# Patient Record
Sex: Female | Born: 1975 | Race: White | Hispanic: No | Marital: Single | Smoking: Current some day smoker
Health system: Southern US, Community
[De-identification: ages and names within clinical notes are randomized; demographics above are authoritative.]

## PROBLEM LIST (undated history)

## (undated) ENCOUNTER — Inpatient Hospital Stay (HOSPITAL_COMMUNITY): Payer: Self-pay

## (undated) ENCOUNTER — Emergency Department (HOSPITAL_COMMUNITY): Discharge status: Against Medical Advice | Payer: Medicaid Other

## (undated) DIAGNOSIS — D219 Benign neoplasm of connective and other soft tissue, unspecified: Secondary | ICD-10-CM

## (undated) DIAGNOSIS — Z302 Encounter for sterilization: Secondary | ICD-10-CM

## (undated) DIAGNOSIS — D259 Leiomyoma of uterus, unspecified: Secondary | ICD-10-CM

## (undated) DIAGNOSIS — O3410 Maternal care for benign tumor of corpus uteri, unspecified trimester: Secondary | ICD-10-CM

## (undated) DIAGNOSIS — O1400 Mild to moderate pre-eclampsia, unspecified trimester: Secondary | ICD-10-CM

## (undated) DIAGNOSIS — F329 Major depressive disorder, single episode, unspecified: Secondary | ICD-10-CM

## (undated) DIAGNOSIS — D251 Intramural leiomyoma of uterus: Secondary | ICD-10-CM

## (undated) DIAGNOSIS — N809 Endometriosis, unspecified: Secondary | ICD-10-CM

## (undated) DIAGNOSIS — Z87440 Personal history of urinary (tract) infections: Secondary | ICD-10-CM

## (undated) DIAGNOSIS — R102 Pelvic and perineal pain: Secondary | ICD-10-CM

## (undated) DIAGNOSIS — M797 Fibromyalgia: Secondary | ICD-10-CM

## (undated) DIAGNOSIS — F32A Depression, unspecified: Secondary | ICD-10-CM

## (undated) DIAGNOSIS — N939 Abnormal uterine and vaginal bleeding, unspecified: Secondary | ICD-10-CM

## (undated) DIAGNOSIS — N801 Endometriosis of ovary: Secondary | ICD-10-CM

## (undated) DIAGNOSIS — Z8751 Personal history of pre-term labor: Secondary | ICD-10-CM

## (undated) DIAGNOSIS — Z9851 Tubal ligation status: Secondary | ICD-10-CM

## (undated) DIAGNOSIS — F419 Anxiety disorder, unspecified: Secondary | ICD-10-CM

## (undated) DIAGNOSIS — E669 Obesity, unspecified: Secondary | ICD-10-CM

## (undated) DIAGNOSIS — Z98891 History of uterine scar from previous surgery: Secondary | ICD-10-CM

## (undated) DIAGNOSIS — D649 Anemia, unspecified: Secondary | ICD-10-CM

## (undated) HISTORY — DX: Obesity, unspecified: E66.9

## (undated) HISTORY — DX: Personal history of pre-term labor: Z87.51

## (undated) HISTORY — PX: CHOLECYSTECTOMY: SHX55

## (undated) HISTORY — PX: KNEE SURGERY: SHX244

## (undated) HISTORY — DX: Personal history of urinary (tract) infections: Z87.440

## (undated) HISTORY — DX: Maternal care for benign tumor of corpus uteri, unspecified trimester: O34.10

## (undated) HISTORY — DX: Leiomyoma of uterus, unspecified: D25.9

---

## 1998-08-05 ENCOUNTER — Encounter: Admission: RE | Admit: 1998-08-05 | Discharge: 1998-08-05 | Payer: Self-pay | Admitting: *Deleted

## 1999-09-13 ENCOUNTER — Other Ambulatory Visit: Admission: RE | Admit: 1999-09-13 | Discharge: 1999-09-13 | Payer: Self-pay | Admitting: *Deleted

## 2000-10-02 ENCOUNTER — Other Ambulatory Visit: Admission: RE | Admit: 2000-10-02 | Discharge: 2000-10-02 | Payer: Self-pay | Admitting: *Deleted

## 2001-10-24 ENCOUNTER — Other Ambulatory Visit: Admission: RE | Admit: 2001-10-24 | Discharge: 2001-10-24 | Payer: Self-pay | Admitting: Obstetrics and Gynecology

## 2002-03-10 ENCOUNTER — Emergency Department (HOSPITAL_COMMUNITY): Admission: EM | Admit: 2002-03-10 | Discharge: 2002-03-10 | Payer: Self-pay | Admitting: Emergency Medicine

## 2002-11-27 ENCOUNTER — Other Ambulatory Visit: Admission: RE | Admit: 2002-11-27 | Discharge: 2002-11-27 | Payer: Self-pay | Admitting: Obstetrics and Gynecology

## 2004-08-09 ENCOUNTER — Emergency Department (HOSPITAL_COMMUNITY): Admission: EM | Admit: 2004-08-09 | Discharge: 2004-08-09 | Payer: Self-pay | Admitting: Emergency Medicine

## 2004-08-09 ENCOUNTER — Emergency Department: Payer: Self-pay | Admitting: Emergency Medicine

## 2006-04-29 ENCOUNTER — Inpatient Hospital Stay (HOSPITAL_COMMUNITY): Admission: AD | Admit: 2006-04-29 | Discharge: 2006-04-29 | Payer: Self-pay | Admitting: Obstetrics and Gynecology

## 2006-05-04 ENCOUNTER — Inpatient Hospital Stay (HOSPITAL_COMMUNITY): Admission: AD | Admit: 2006-05-04 | Discharge: 2006-05-04 | Payer: Self-pay | Admitting: Obstetrics and Gynecology

## 2006-12-05 ENCOUNTER — Emergency Department (HOSPITAL_COMMUNITY): Admission: EM | Admit: 2006-12-05 | Discharge: 2006-12-05 | Payer: Self-pay | Admitting: Emergency Medicine

## 2008-07-18 ENCOUNTER — Emergency Department (HOSPITAL_COMMUNITY): Admission: EM | Admit: 2008-07-18 | Discharge: 2008-07-18 | Payer: Self-pay | Admitting: Emergency Medicine

## 2011-04-04 LAB — RPR: RPR: NONREACTIVE

## 2011-04-04 LAB — GC/CHLAMYDIA PROBE AMP, GENITAL: Chlamydia: NEGATIVE

## 2011-04-04 LAB — RUBELLA ANTIBODY, IGM: Rubella: IMMUNE

## 2011-04-04 LAB — HIV ANTIBODY (ROUTINE TESTING W REFLEX): HIV: NONREACTIVE

## 2011-07-14 ENCOUNTER — Encounter (HOSPITAL_COMMUNITY): Payer: Self-pay | Admitting: *Deleted

## 2011-07-14 ENCOUNTER — Inpatient Hospital Stay (HOSPITAL_COMMUNITY)
Admission: AD | Admit: 2011-07-14 | Discharge: 2011-07-14 | Disposition: A | Discharge status: Home / Self Care | Payer: Medicaid Other | Source: Ambulatory Visit | Attending: Obstetrics and Gynecology | Admitting: Obstetrics and Gynecology

## 2011-07-14 DIAGNOSIS — O99891 Other specified diseases and conditions complicating pregnancy: Secondary | ICD-10-CM | POA: Insufficient documentation

## 2011-07-14 DIAGNOSIS — O4700 False labor before 37 completed weeks of gestation, unspecified trimester: Secondary | ICD-10-CM

## 2011-07-14 DIAGNOSIS — R030 Elevated blood-pressure reading, without diagnosis of hypertension: Secondary | ICD-10-CM | POA: Insufficient documentation

## 2011-07-14 DIAGNOSIS — O479 False labor, unspecified: Secondary | ICD-10-CM

## 2011-07-14 HISTORY — DX: Fibromyalgia: M79.7

## 2011-07-14 HISTORY — DX: Depression, unspecified: F32.A

## 2011-07-14 HISTORY — DX: Anxiety disorder, unspecified: F41.9

## 2011-07-14 HISTORY — DX: Major depressive disorder, single episode, unspecified: F32.9

## 2011-07-14 LAB — COMPREHENSIVE METABOLIC PANEL
AST: 11 U/L (ref 0–37)
CO2: 24 mEq/L (ref 19–32)
Chloride: 101 mEq/L (ref 96–112)
Creatinine, Ser: 0.51 mg/dL (ref 0.50–1.10)
GFR calc Af Amer: >90 mL/min (ref >90)
GFR calc non Af Amer: >90 mL/min (ref >90)
Glucose, Bld: 106 mg/dL — ABNORMAL HIGH (ref 70–99)
Potassium: 3.5 mEq/L (ref 3.5–5.1)
Sodium: 135 mEq/L (ref 135–145)
Total Protein: 6.6 g/dL (ref 6.0–8.3)

## 2011-07-14 LAB — CBC
HCT: 32.7 % — ABNORMAL LOW (ref 36.0–46.0)
Hemoglobin: 10.9 g/dL — ABNORMAL LOW (ref 12.0–15.0)
MCHC: 33.3 g/dL (ref 30.0–36.0)
MCV: 91.3 fL (ref 78.0–100.0)
RDW: 13.1 % (ref 11.5–15.5)

## 2011-07-14 MED ORDER — TERBUTALINE SULFATE 1 MG/ML IJ SOLN
0.2500 mg | Freq: Once | INTRAMUSCULAR | Status: AC
  Administered 2011-07-14: 0.25 mg via SUBCUTANEOUS
  Filled 2011-07-14: qty 1

## 2011-07-14 NOTE — Progress Notes (Signed)
Pt sent over from office for pih eval and preterm labor eval.  Reports ucs since 1030, progressively getting stronger and closer together.  Reports mild headache throughout day, denies any dizziness, blurred vision or epigastric pain.  Denies any bleeding or lof.  + FM

## 2011-07-14 NOTE — ED Provider Notes (Signed)
History   Pt presents today after being seen in the office by Dr. Ambrose Mantle. At that time she was noted to have an elevated BP and ctx. She was sent to MAU for preeclamptic workup and fetal monitoring. Fetal fibronectin was performed in the office.  Chief Complaint  Patient presents with  . Contractions  . Hypertension   HPI  OB History    Grav Para Term Preterm Abortions TAB SAB Ect Mult Living   3 2 0 2 0 0 0 0 0 0       Past Medical History  Diagnosis Date  . Anxiety   . Fibromyalgia   . Depression     Past Surgical History  Procedure Date  . Knee surgery   . Cholecystectomy     History reviewed. No pertinent family history.  History  Substance Use Topics  . Smoking status: Never Smoker   . Smokeless tobacco: Not on file  . Alcohol Use: No    Allergies: No Known Allergies  Prescriptions prior to admission  Medication Sig Dispense Refill  . acetaminophen (TYLENOL) 500 MG chewable tablet Chew 500 mg by mouth every 6 (six) hours as needed. pain      . Prenatal Vit-Fe Fumarate-FA (PRENATAL MULTIVITAMIN) TABS Take 1 tablet by mouth daily.        Review of Systems  Constitutional: Negative for fever.  Eyes: Negative for blurred vision and double vision.  Cardiovascular: Negative for chest pain and palpitations.  Gastrointestinal: Positive for abdominal pain. Negative for nausea, vomiting, diarrhea, constipation and blood in stool.  Genitourinary: Negative for dysuria, urgency, frequency and hematuria.  Neurological: Positive for headaches. Negative for dizziness.  Psychiatric/Behavioral: Negative for depression and suicidal ideas.   Physical Exam   Blood pressure 147/93, pulse 88, temperature 98.1 F (36.7 C), temperature source Oral, resp. rate 18, height 5\' 9"  (1.753 m), weight 215 lb (97.523 kg).  Physical Exam  Nursing note and vitals reviewed. Constitutional: She is oriented to person, place, and time. She appears well-developed and well-nourished. No  distress.  HENT:  Head: Normocephalic and atraumatic.  Eyes: EOM are normal. Pupils are equal, round, and reactive to light.  GI: Soft. She exhibits no distension. There is no tenderness. There is no rebound and no guarding.  Neurological: She is alert and oriented to person, place, and time.  Skin: Skin is warm and dry. She is not diaphoretic.  Psychiatric: She has a normal mood and affect. Her behavior is normal. Judgment and thought content normal.    MAU Course  Procedures  Results for orders placed during the hospital encounter of 07/14/11 (from the past 72 hour(s))  CBC     Status: Abnormal   Collection Time   07/14/11  2:02 PM      Component Value Range Comment   WBC 14.9 (*) 4.0 - 10.5 (K/uL)    RBC 3.58 (*) 3.87 - 5.11 (MIL/uL)    Hemoglobin 10.9 (*) 12.0 - 15.0 (g/dL)    HCT 40.9 (*) 81.1 - 46.0 (%)    MCV 91.3  78.0 - 100.0 (fL)    MCH 30.4  26.0 - 34.0 (pg)    MCHC 33.3  30.0 - 36.0 (g/dL)    RDW 91.4  78.2 - 95.6 (%)    Platelets 300  150 - 400 (K/uL)   COMPREHENSIVE METABOLIC PANEL     Status: Abnormal   Collection Time   07/14/11  2:02 PM      Component Value Range Comment  Sodium 135  135 - 145 (mEq/L)    Potassium 3.5  3.5 - 5.1 (mEq/L)    Chloride 101  96 - 112 (mEq/L)    CO2 24  19 - 32 (mEq/L)    Glucose, Bld 106 (*) 70 - 99 (mg/dL)    BUN 7  6 - 23 (mg/dL)    Creatinine, Ser 1.61  0.50 - 1.10 (mg/dL)    Calcium 8.9  8.4 - 10.5 (mg/dL)    Total Protein 6.6  6.0 - 8.3 (g/dL)    Albumin 3.2 (*) 3.5 - 5.2 (g/dL)    AST 11  0 - 37 (U/L)    ALT 10  0 - 35 (U/L)    Alkaline Phosphatase 71  39 - 117 (U/L)    Total Bilirubin 0.2 (*) 0.3 - 1.2 (mg/dL)    GFR calc non Af Amer >90  >90 (mL/min)    GFR calc Af Amer >90  >90 (mL/min)   LACTATE DEHYDROGENASE     Status: Normal   Collection Time   07/14/11  2:02 PM      Component Value Range Comment   LD 124  94 - 250 (U/L)   URIC ACID     Status: Normal   Collection Time   07/14/11  2:02 PM      Component  Value Range Comment   Uric Acid, Serum 4.4  2.4 - 7.0 (mg/dL)    NST still shows ctx every 1-2 min.   Discussed pt with Dr. Ambrose Mantle. Advised to give terbutaline.  Pt no longer having ctx. Discussed with Dr. Ambrose Mantle. Advised to dc pt with precautions.   Assessment and Plan  TPTL: discussed with pt at length. Discussed signs and sx of preterm labor. She has f/u scheduled. Discussed diet, activity, risks, and precautions.  Also discussed signs and sx of preeclampsia.  Clinton Gallant. Connee Ikner III, DrHSc, MPAS, PA-C  07/14/2011, 2:27 PM   Henrietta Hoover, PA 07/14/11 1702

## 2011-08-27 ENCOUNTER — Encounter (HOSPITAL_COMMUNITY): Payer: Self-pay | Admitting: *Deleted

## 2011-08-27 ENCOUNTER — Inpatient Hospital Stay (HOSPITAL_COMMUNITY)
Admission: AD | Admit: 2011-08-27 | Discharge: 2011-08-27 | Disposition: A | Discharge status: Home Health | Payer: Medicaid Other | Source: Ambulatory Visit | Attending: Obstetrics and Gynecology | Admitting: Obstetrics and Gynecology

## 2011-08-27 DIAGNOSIS — O4700 False labor before 37 completed weeks of gestation, unspecified trimester: Secondary | ICD-10-CM | POA: Insufficient documentation

## 2011-08-27 DIAGNOSIS — O479 False labor, unspecified: Secondary | ICD-10-CM

## 2011-08-27 HISTORY — DX: Benign neoplasm of connective and other soft tissue, unspecified: D21.9

## 2011-08-27 LAB — URINALYSIS, ROUTINE W REFLEX MICROSCOPIC
Bilirubin Urine: NEGATIVE
Glucose, UA: NEGATIVE mg/dL
Ketones, ur: NEGATIVE mg/dL
Leukocytes, UA: NEGATIVE
Nitrite: NEGATIVE
Specific Gravity, Urine: 1.01 (ref 1.005–1.030)

## 2011-08-27 LAB — URINE MICROSCOPIC-ADD ON

## 2011-08-27 NOTE — ED Provider Notes (Signed)
Allyn WINIFRED BODIFORD is a 36 y.o. year old G3P0202 female at [redacted]w[redacted]d weeks gestation who presents to MAU reporting mild preterm UC's since last night, worse since 1430. She reports pos FM and denies vaginal bleeding or leaking of fluid.   Maternal Medical History:  Reason for admission: Reason for Admission:   nausea  OB History    Grav Para Term Preterm Abortions TAB SAB Ect Mult Living   3 2 0 2 0 0 0 0 0 2      Past Medical History  Diagnosis Date  . Anxiety   . Fibromyalgia   . Depression   . Fibroids    Past Surgical History  Procedure Date  . Knee surgery   . Cholecystectomy    Family History: family history is not on file. Social History:  reports that she has been smoking Cigarettes.  She has been smoking about 1 pack per day. She has never used smokeless tobacco. She reports that she does not drink alcohol or use illicit drugs.  Review of Systems  Constitutional: Negative for fever and chills.  Gastrointestinal: Positive for abdominal pain. Negative for nausea and vomiting.  Genitourinary: Negative for dysuria, urgency, frequency, hematuria and flank pain.      Blood pressure 122/90, pulse 100, temperature 97.7 F (36.5 C), temperature source Oral, height 5\' 9"  (1.753 m), weight 103.874 kg (229 lb). Maternal Exam:  Uterine Assessment: Contraction strength is mild.  Contraction duration is 20 seconds. Contraction frequency is regular.   Abdomen: Fundal height is S>D.    Introitus: Normal vulva. Normal vagina.  Pelvis: adequate for delivery.   Cervix: Cervix evaluated by sterile speculum exam and digital exam.     Fetal Exam Fetal Monitor Review: Mode: ultrasound.   Baseline rate: 130.  Variability: moderate (6-25 bpm).   Pattern: accelerations present and no decelerations.    Fetal State Assessment: Category I - tracings are normal.     Physical Exam  Nursing note and vitals reviewed. Constitutional: She is oriented to person, place, and time. She appears  well-developed and well-nourished. No distress.  Cardiovascular: Normal rate.   Respiratory: Effort normal.  GI: Soft. There is no tenderness.  Genitourinary: Vagina normal and uterus normal.  Neurological: She is alert and oriented to person, place, and time.  Skin: Skin is warm and dry.  Psychiatric: She has a normal mood and affect.   Dilation: Fingertip Effacement (%): Thick Cervical Position: Posterior Station: -2 Presentation: Undeterminable Exam by:: V.Braelin Brosch,CNM Prenatal labs: ABO, Rh:   Antibody:   Rubella:   RPR:    HBsAg:    HIV:    GBS:     Assessment/Plan: D/C home per consult w/ Bovard GBS sent PLT precautions, FKCs F/U in office as scheduled or MAU PRN  Dorathy Kinsman 08/27/2011, 5:44 PM

## 2011-08-27 NOTE — Progress Notes (Signed)
Ctx all night, settled down for few hours, gotten stronger at 1430

## 2011-08-30 LAB — CULTURE, BETA STREP (GROUP B ONLY): Special Requests: NORMAL

## 2011-09-01 LAB — STREP B DNA PROBE: GBS: NEGATIVE

## 2011-09-16 ENCOUNTER — Encounter (HOSPITAL_COMMUNITY): Payer: Self-pay

## 2011-09-19 ENCOUNTER — Encounter (HOSPITAL_COMMUNITY): Payer: Self-pay | Admitting: *Deleted

## 2011-09-19 ENCOUNTER — Telehealth (HOSPITAL_COMMUNITY): Payer: Self-pay | Admitting: *Deleted

## 2011-09-19 ENCOUNTER — Other Ambulatory Visit: Payer: Self-pay | Admitting: Obstetrics and Gynecology

## 2011-09-19 NOTE — Telephone Encounter (Signed)
Preadmission screen  

## 2011-09-20 ENCOUNTER — Encounter (HOSPITAL_COMMUNITY): Payer: Self-pay

## 2011-09-20 ENCOUNTER — Encounter (HOSPITAL_COMMUNITY): Payer: Self-pay | Admitting: Anesthesiology

## 2011-09-20 ENCOUNTER — Inpatient Hospital Stay (HOSPITAL_COMMUNITY)
Admission: RE | Admit: 2011-09-20 | Discharge: 2011-09-22 | DRG: 765 | Disposition: A | Discharge status: Home / Self Care | Payer: Medicaid Other | Source: Ambulatory Visit | Attending: Obstetrics and Gynecology | Admitting: Obstetrics and Gynecology

## 2011-09-20 ENCOUNTER — Inpatient Hospital Stay (HOSPITAL_COMMUNITY): Payer: Medicaid Other | Admitting: Anesthesiology

## 2011-09-20 ENCOUNTER — Encounter (HOSPITAL_COMMUNITY)
Admission: RE | Disposition: A | Discharge status: Home / Self Care | Payer: Self-pay | Source: Ambulatory Visit | Attending: Obstetrics and Gynecology

## 2011-09-20 VITALS — BP 125/83 | HR 83 | Temp 98.4°F | Resp 20 | Ht 69.0 in | Wt 220.0 lb

## 2011-09-20 DIAGNOSIS — O321XX0 Maternal care for breech presentation, not applicable or unspecified: Secondary | ICD-10-CM | POA: Diagnosis present

## 2011-09-20 DIAGNOSIS — IMO0002 Reserved for concepts with insufficient information to code with codable children: Principal | ICD-10-CM | POA: Diagnosis present

## 2011-09-20 DIAGNOSIS — O409XX0 Polyhydramnios, unspecified trimester, not applicable or unspecified: Secondary | ICD-10-CM

## 2011-09-20 DIAGNOSIS — Z98891 History of uterine scar from previous surgery: Secondary | ICD-10-CM

## 2011-09-20 DIAGNOSIS — O1400 Mild to moderate pre-eclampsia, unspecified trimester: Secondary | ICD-10-CM

## 2011-09-20 HISTORY — DX: History of uterine scar from previous surgery: Z98.891

## 2011-09-20 HISTORY — DX: Mild to moderate pre-eclampsia, unspecified trimester: O14.00

## 2011-09-20 LAB — ABO/RH: ABO/RH(D): O POS

## 2011-09-20 LAB — COMPREHENSIVE METABOLIC PANEL
AST: 13 U/L (ref 0–37)
Albumin: 3 g/dL — ABNORMAL LOW (ref 3.5–5.2)
Alkaline Phosphatase: 129 U/L — ABNORMAL HIGH (ref 39–117)
Chloride: 103 mEq/L (ref 96–112)
Creatinine, Ser: 0.5 mg/dL (ref 0.50–1.10)
Potassium: 3.8 mEq/L (ref 3.5–5.1)
Sodium: 137 mEq/L (ref 135–145)
Total Bilirubin: 0.2 mg/dL — ABNORMAL LOW (ref 0.3–1.2)

## 2011-09-20 LAB — CBC
Platelets: 404 10*3/uL — ABNORMAL HIGH (ref 150–400)
RDW: 13.8 % (ref 11.5–15.5)
WBC: 18 10*3/uL — ABNORMAL HIGH (ref 4.0–10.5)

## 2011-09-20 SURGERY — Surgical Case
Anesthesia: Spinal | Site: Abdomen | Wound class: Clean Contaminated

## 2011-09-20 MED ORDER — CEFAZOLIN SODIUM 1-5 GM-% IV SOLN
INTRAVENOUS | Status: DC | PRN
  Administered 2011-09-20: 2 g via INTRAVENOUS

## 2011-09-20 MED ORDER — ACETAMINOPHEN 325 MG PO TABS: 325.0000 mg | ORAL_TABLET | ORAL | Status: DC | PRN

## 2011-09-20 MED ORDER — TETANUS-DIPHTH-ACELL PERTUSSIS 5-2.5-18.5 LF-MCG/0.5 IM SUSP
0.5000 mL | Freq: Once | INTRAMUSCULAR | Status: AC
  Administered 2011-09-21: 0.5 mL via INTRAMUSCULAR
  Filled 2011-09-20: qty 0.5

## 2011-09-20 MED ORDER — DIPHENHYDRAMINE HCL 50 MG/ML IJ SOLN: 25.0000 mg | INTRAMUSCULAR | Status: DC | PRN

## 2011-09-20 MED ORDER — LACTATED RINGERS IV SOLN
INTRAVENOUS | Status: DC
  Administered 2011-09-21: 01:00:00 via INTRAVENOUS

## 2011-09-20 MED ORDER — GUAIFENESIN 100 MG/5ML PO SOLN: 10.0000 mL | ORAL | Status: DC | PRN

## 2011-09-20 MED ORDER — FENTANYL CITRATE 0.05 MG/ML IJ SOLN
INTRAMUSCULAR | Status: AC
  Filled 2011-09-20: qty 2

## 2011-09-20 MED ORDER — ONDANSETRON HCL 4 MG/2ML IJ SOLN
INTRAMUSCULAR | Status: AC
  Filled 2011-09-20: qty 2

## 2011-09-20 MED ORDER — NALBUPHINE HCL 10 MG/ML IJ SOLN
5.0000 mg | INTRAMUSCULAR | Status: DC | PRN
  Administered 2011-09-20: 5 mg via INTRAVENOUS
  Administered 2011-09-20 (×2): 10 mg via INTRAVENOUS
  Filled 2011-09-20 (×3): qty 1

## 2011-09-20 MED ORDER — NALBUPHINE SYRINGE 5 MG/0.5 ML
INJECTION | INTRAMUSCULAR | Status: AC
  Filled 2011-09-20: qty 0.5

## 2011-09-20 MED ORDER — PRENATAL MULTIVITAMIN CH: 1.0000 | ORAL_TABLET | Freq: Every day | ORAL | Status: DC

## 2011-09-20 MED ORDER — DIPHENHYDRAMINE HCL 50 MG/ML IJ SOLN
INTRAMUSCULAR | Status: DC | PRN
  Administered 2011-09-20 (×2): 12.5 mg via INTRAVENOUS

## 2011-09-20 MED ORDER — SCOPOLAMINE 1 MG/3DAYS TD PT72
1.0000 | MEDICATED_PATCH | Freq: Once | TRANSDERMAL | Status: DC
  Administered 2011-09-20: 1.5 mg via TRANSDERMAL

## 2011-09-20 MED ORDER — FENTANYL CITRATE 0.05 MG/ML IJ SOLN: 25.0000 ug | INTRAMUSCULAR | Status: DC | PRN

## 2011-09-20 MED ORDER — SIMETHICONE 80 MG PO CHEW
80.0000 mg | CHEWABLE_TABLET | Freq: Three times a day (TID) | ORAL | Status: DC
  Administered 2011-09-20 – 2011-09-21 (×6): 80 mg via ORAL

## 2011-09-20 MED ORDER — NALBUPHINE HCL 10 MG/ML IJ SOLN
5.0000 mg | INTRAMUSCULAR | Status: DC | PRN
  Administered 2011-09-20: 5 mg via SUBCUTANEOUS
  Filled 2011-09-20 (×2): qty 1

## 2011-09-20 MED ORDER — ONDANSETRON HCL 4 MG PO TABS: 4.0000 mg | ORAL_TABLET | ORAL | Status: DC | PRN

## 2011-09-20 MED ORDER — MORPHINE SULFATE 0.5 MG/ML IJ SOLN
INTRAMUSCULAR | Status: AC
  Filled 2011-09-20: qty 10

## 2011-09-20 MED ORDER — IBUPROFEN 600 MG PO TABS: 600.0000 mg | ORAL_TABLET | Freq: Four times a day (QID) | ORAL | Status: DC | PRN

## 2011-09-20 MED ORDER — MEPERIDINE HCL 25 MG/ML IJ SOLN: 6.2500 mg | INTRAMUSCULAR | Status: DC | PRN

## 2011-09-20 MED ORDER — OXYTOCIN BOLUS FROM INFUSION
500.0000 mL | Freq: Once | INTRAVENOUS | Status: DC
  Filled 2011-09-20: qty 500

## 2011-09-20 MED ORDER — CITRIC ACID-SODIUM CITRATE 334-500 MG/5ML PO SOLN
30.0000 mL | ORAL | Status: DC | PRN
  Administered 2011-09-20: 30 mL via ORAL
  Filled 2011-09-20: qty 15

## 2011-09-20 MED ORDER — TERBUTALINE SULFATE 1 MG/ML IJ SOLN: 0.2500 mg | Freq: Once | INTRAMUSCULAR | Status: DC | PRN

## 2011-09-20 MED ORDER — PRENATAL MULTIVITAMIN CH
1.0000 | ORAL_TABLET | Freq: Every day | ORAL | Status: DC
  Administered 2011-09-21 – 2011-09-22 (×2): 1 via ORAL
  Filled 2011-09-20 (×2): qty 1

## 2011-09-20 MED ORDER — BUTORPHANOL TARTRATE 2 MG/ML IJ SOLN: 2.0000 mg | INTRAMUSCULAR | Status: DC | PRN

## 2011-09-20 MED ORDER — FENTANYL CITRATE 0.05 MG/ML IJ SOLN
INTRAMUSCULAR | Status: DC | PRN
  Administered 2011-09-20: 25 ug via INTRATHECAL

## 2011-09-20 MED ORDER — ONDANSETRON HCL 4 MG/2ML IJ SOLN: 4.0000 mg | INTRAMUSCULAR | Status: DC | PRN

## 2011-09-20 MED ORDER — PROMETHAZINE HCL 25 MG/ML IJ SOLN: 6.2500 mg | INTRAMUSCULAR | Status: DC | PRN

## 2011-09-20 MED ORDER — ONDANSETRON HCL 4 MG/2ML IJ SOLN: 4.0000 mg | Freq: Four times a day (QID) | INTRAMUSCULAR | Status: DC | PRN

## 2011-09-20 MED ORDER — FLEET ENEMA 7-19 GM/118ML RE ENEM: 1.0000 | ENEMA | RECTAL | Status: DC | PRN

## 2011-09-20 MED ORDER — OXYCODONE-ACETAMINOPHEN 5-325 MG PO TABS
1.0000 | ORAL_TABLET | ORAL | Status: DC | PRN
  Administered 2011-09-21 – 2011-09-22 (×7): 2 via ORAL
  Filled 2011-09-20 (×7): qty 2

## 2011-09-20 MED ORDER — SIMETHICONE 80 MG PO CHEW: 80.0000 mg | CHEWABLE_TABLET | ORAL | Status: DC | PRN

## 2011-09-20 MED ORDER — BUPIVACAINE IN DEXTROSE 0.75-8.25 % IT SOLN
INTRATHECAL | Status: DC | PRN
  Administered 2011-09-20: 1.6 mL via INTRATHECAL

## 2011-09-20 MED ORDER — OXYTOCIN 20 UNITS IN LACTATED RINGERS INFUSION - SIMPLE: 1.0000 m[IU]/min | INTRAVENOUS | Status: DC

## 2011-09-20 MED ORDER — ZOLPIDEM TARTRATE 5 MG PO TABS: 5.0000 mg | ORAL_TABLET | Freq: Every evening | ORAL | Status: DC | PRN

## 2011-09-20 MED ORDER — CEFAZOLIN SODIUM-DEXTROSE 2-3 GM-% IV SOLR: 2.0000 g | INTRAVENOUS | Status: DC

## 2011-09-20 MED ORDER — METOCLOPRAMIDE HCL 5 MG/ML IJ SOLN: 10.0000 mg | Freq: Three times a day (TID) | INTRAMUSCULAR | Status: DC | PRN

## 2011-09-20 MED ORDER — KETOROLAC TROMETHAMINE 30 MG/ML IJ SOLN
30.0000 mg | Freq: Four times a day (QID) | INTRAMUSCULAR | Status: AC | PRN
  Administered 2011-09-20: 30 mg via INTRAMUSCULAR

## 2011-09-20 MED ORDER — OXYTOCIN 10 UNIT/ML IJ SOLN
INTRAMUSCULAR | Status: AC
  Filled 2011-09-20: qty 2

## 2011-09-20 MED ORDER — DIPHENHYDRAMINE HCL 25 MG PO CAPS
25.0000 mg | ORAL_CAPSULE | ORAL | Status: DC | PRN
  Administered 2011-09-21 (×2): 25 mg via ORAL
  Filled 2011-09-20 (×2): qty 1

## 2011-09-20 MED ORDER — LACTATED RINGERS IV SOLN: INTRAVENOUS | Status: DC

## 2011-09-20 MED ORDER — ACETAMINOPHEN 325 MG PO TABS: 650.0000 mg | ORAL_TABLET | ORAL | Status: DC | PRN

## 2011-09-20 MED ORDER — KETOROLAC TROMETHAMINE 30 MG/ML IJ SOLN
INTRAMUSCULAR | Status: AC
  Filled 2011-09-20: qty 1

## 2011-09-20 MED ORDER — LANOLIN HYDROUS EX OINT: 1.0000 "application " | TOPICAL_OINTMENT | CUTANEOUS | Status: DC | PRN

## 2011-09-20 MED ORDER — ACETAMINOPHEN 10 MG/ML IV SOLN
1000.0000 mg | Freq: Four times a day (QID) | INTRAVENOUS | Status: AC | PRN
  Filled 2011-09-20: qty 100

## 2011-09-20 MED ORDER — FAMOTIDINE 20 MG PO TABS
20.0000 mg | ORAL_TABLET | Freq: Two times a day (BID) | ORAL | Status: DC
  Administered 2011-09-20 – 2011-09-22 (×4): 20 mg via ORAL
  Filled 2011-09-20 (×4): qty 1

## 2011-09-20 MED ORDER — OXYTOCIN 20 UNITS IN LACTATED RINGERS INFUSION - SIMPLE: 125.0000 mL/h | Freq: Once | INTRAVENOUS | Status: DC

## 2011-09-20 MED ORDER — SODIUM CHLORIDE 0.9 % IV SOLN
1.0000 ug/kg/h | INTRAVENOUS | Status: DC | PRN
  Filled 2011-09-20: qty 2.5

## 2011-09-20 MED ORDER — NALOXONE HCL 0.4 MG/ML IJ SOLN: 0.4000 mg | INTRAMUSCULAR | Status: DC | PRN

## 2011-09-20 MED ORDER — LIDOCAINE HCL (PF) 1 % IJ SOLN: 30.0000 mL | INTRAMUSCULAR | Status: DC | PRN

## 2011-09-20 MED ORDER — MORPHINE SULFATE (PF) 0.5 MG/ML IJ SOLN
INTRAMUSCULAR | Status: DC | PRN
  Administered 2011-09-20: .1 mg via INTRATHECAL

## 2011-09-20 MED ORDER — KETOROLAC TROMETHAMINE 30 MG/ML IJ SOLN: 30.0000 mg | Freq: Four times a day (QID) | INTRAMUSCULAR | Status: AC | PRN

## 2011-09-20 MED ORDER — WITCH HAZEL-GLYCERIN EX PADS: 1.0000 "application " | MEDICATED_PAD | CUTANEOUS | Status: DC | PRN

## 2011-09-20 MED ORDER — PHENYLEPHRINE HCL 10 MG/ML IJ SOLN
INTRAMUSCULAR | Status: DC | PRN
  Administered 2011-09-20: 120 ug via INTRAVENOUS

## 2011-09-20 MED ORDER — IBUPROFEN 800 MG PO TABS
800.0000 mg | ORAL_TABLET | Freq: Three times a day (TID) | ORAL | Status: DC
  Administered 2011-09-20 – 2011-09-22 (×6): 800 mg via ORAL
  Filled 2011-09-20 (×7): qty 1

## 2011-09-20 MED ORDER — MENTHOL 3 MG MT LOZG: 1.0000 | LOZENGE | OROMUCOSAL | Status: DC | PRN

## 2011-09-20 MED ORDER — DIPHENHYDRAMINE HCL 25 MG PO CAPS: 25.0000 mg | ORAL_CAPSULE | Freq: Four times a day (QID) | ORAL | Status: DC | PRN

## 2011-09-20 MED ORDER — DIPHENHYDRAMINE HCL 50 MG/ML IJ SOLN
INTRAMUSCULAR | Status: AC
  Filled 2011-09-20: qty 1

## 2011-09-20 MED ORDER — OXYTOCIN 20 UNITS IN LACTATED RINGERS INFUSION - SIMPLE
125.0000 mL/h | INTRAVENOUS | Status: AC
  Administered 2011-09-20: 125 mL/h via INTRAVENOUS

## 2011-09-20 MED ORDER — OXYTOCIN 20 UNITS IN LACTATED RINGERS INFUSION - SIMPLE
INTRAVENOUS | Status: DC | PRN
  Administered 2011-09-20: 20 [IU] via INTRAVENOUS

## 2011-09-20 MED ORDER — DIPHENHYDRAMINE HCL 50 MG/ML IJ SOLN: 12.5000 mg | INTRAMUSCULAR | Status: DC | PRN

## 2011-09-20 MED ORDER — CEFAZOLIN SODIUM 1-5 GM-% IV SOLN
INTRAVENOUS | Status: AC
  Filled 2011-09-20: qty 100

## 2011-09-20 MED ORDER — SENNOSIDES-DOCUSATE SODIUM 8.6-50 MG PO TABS
2.0000 | ORAL_TABLET | Freq: Every day | ORAL | Status: DC
  Administered 2011-09-20 – 2011-09-21 (×2): 2 via ORAL

## 2011-09-20 MED ORDER — LACTATED RINGERS IV SOLN
INTRAVENOUS | Status: DC
Start: 2011-09-20 — End: 2011-09-20
  Administered 2011-09-20 (×2): via INTRAVENOUS

## 2011-09-20 MED ORDER — LACTATED RINGERS IV SOLN: 500.0000 mL | INTRAVENOUS | Status: DC | PRN

## 2011-09-20 MED ORDER — OXYCODONE-ACETAMINOPHEN 5-325 MG PO TABS: 1.0000 | ORAL_TABLET | ORAL | Status: DC | PRN

## 2011-09-20 MED ORDER — DIBUCAINE 1 % RE OINT: 1.0000 "application " | TOPICAL_OINTMENT | RECTAL | Status: DC | PRN

## 2011-09-20 MED ORDER — SODIUM CHLORIDE 0.9 % IJ SOLN: 3.0000 mL | INTRAMUSCULAR | Status: DC | PRN

## 2011-09-20 MED ORDER — ONDANSETRON HCL 4 MG/2ML IJ SOLN: 4.0000 mg | Freq: Three times a day (TID) | INTRAMUSCULAR | Status: DC | PRN

## 2011-09-20 MED ORDER — ONDANSETRON HCL 4 MG/2ML IJ SOLN
INTRAMUSCULAR | Status: DC | PRN
  Administered 2011-09-20: 4 mg via INTRAVENOUS

## 2011-09-20 MED ORDER — PHENYLEPHRINE 40 MCG/ML (10ML) SYRINGE FOR IV PUSH (FOR BLOOD PRESSURE SUPPORT)
PREFILLED_SYRINGE | INTRAVENOUS | Status: AC
  Filled 2011-09-20: qty 5

## 2011-09-20 SURGICAL SUPPLY — 33 items
BENZOIN TINCTURE PRP APPL 2/3 (GAUZE/BANDAGES/DRESSINGS) ×2 IMPLANT
CHLORAPREP W/TINT 26ML (MISCELLANEOUS) ×2 IMPLANT
CLOTH BEACON ORANGE TIMEOUT ST (SAFETY) ×2 IMPLANT
CONTAINER PREFILL 10% NBF 15ML (MISCELLANEOUS) IMPLANT
DRSG COVADERM 4X10 (GAUZE/BANDAGES/DRESSINGS) ×2 IMPLANT
ELECT REM PT RETURN 9FT ADLT (ELECTROSURGICAL) ×2
ELECTRODE REM PT RTRN 9FT ADLT (ELECTROSURGICAL) ×1 IMPLANT
EXTRACTOR VACUUM M CUP 4 TUBE (SUCTIONS) ×2 IMPLANT
GLOVE BIO SURGEON STRL SZ 6.5 (GLOVE) ×2 IMPLANT
GLOVE BIO SURGEON STRL SZ7 (GLOVE) ×2 IMPLANT
GOWN PREVENTION PLUS LG XLONG (DISPOSABLE) ×6 IMPLANT
KIT ABG SYR 3ML LUER SLIP (SYRINGE) IMPLANT
NEEDLE HYPO 25X5/8 SAFETYGLIDE (NEEDLE) IMPLANT
NS IRRIG 1000ML POUR BTL (IV SOLUTION) ×2 IMPLANT
PACK C SECTION WH (CUSTOM PROCEDURE TRAY) ×2 IMPLANT
RTRCTR C-SECT PINK 25CM LRG (MISCELLANEOUS) ×2 IMPLANT
SLEEVE SCD COMPRESS KNEE MED (MISCELLANEOUS) ×2 IMPLANT
SLEEVE SURGEON STRL (DRAPES) ×2 IMPLANT
STAPLER VISISTAT 35W (STAPLE) IMPLANT
STRIP CLOSURE SKIN 1/2X4 (GAUZE/BANDAGES/DRESSINGS) ×2 IMPLANT
SUT MNCRL 0 VIOLET CTX 36 (SUTURE) ×2 IMPLANT
SUT MONOCRYL 0 CTX 36 (SUTURE) ×2
SUT PLAIN 1 NONE 54 (SUTURE) IMPLANT
SUT PLAIN 2 0 XLH (SUTURE) IMPLANT
SUT VIC AB 0 CT1 27 (SUTURE) ×2
SUT VIC AB 0 CT1 27XBRD ANBCTR (SUTURE) ×2 IMPLANT
SUT VIC AB 2-0 CT1 27 (SUTURE) ×1
SUT VIC AB 2-0 CT1 TAPERPNT 27 (SUTURE) ×1 IMPLANT
SUT VIC AB 3-0 FS2 27 (SUTURE) ×2 IMPLANT
SYR BULB IRRIGATION 50ML (SYRINGE) ×2 IMPLANT
TOWEL OR 17X24 6PK STRL BLUE (TOWEL DISPOSABLE) ×4 IMPLANT
TRAY FOLEY CATH 14FR (SET/KITS/TRAYS/PACK) ×2 IMPLANT
WATER STERILE IRR 1000ML POUR (IV SOLUTION) IMPLANT

## 2011-09-20 NOTE — Brief Op Note (Signed)
09/20/2011  10:53 AM  PATIENT:  [name]  36 y.o. female  PRE-OPERATIVE DIAGNOSIS:  In Labor Transverse Lie at thirty-seven weeks, PreE  POST-OPERATIVE DIAGNOSIS:  In Labor Transverse Lie at thirty-seven weeks, PreE, delivered  PROCEDURE:  Procedure(s) (LRB): CESAREAN SECTION (N/A)  SURGEON:  Surgeon(s) and Role:    * Sherron Monday, MD - Primary  ANESTHESIA:   spinal  EBL:  Total I/O In: 1300 [I.V.:1300] Out: 1800 [Urine:150; Blood:1650]  BLOOD ADMINISTERED:none  DRAINS: none   LOCAL MEDICATIONS USED:  NONE  SPECIMEN:  Source of Specimen:  Placenta  DISPOSITION OF SPECIMEN:  L&D  COUNTS:  YES  TOURNIQUET:  * No tourniquets in log *  DICTATION: .Other Dictation: Dictation Number [id number]  PLAN OF CARE: Admit to inpatient   PATIENT DISPOSITION:  PACU - hemodynamically stable.   Delay start of Pharmacological VTE agent (>24hrs) due to surgical blood loss or risk of bleeding: not applicable

## 2011-09-20 NOTE — Consult Note (Signed)
Requested to attend 37 + week gestation delivery by primary C/S for significant polyhydramnios with mild preeclampsia but no h/o diabetes. Amniotic sac had been needle-decompressed prior to transfer to OR.   At delivery infant was in a non-vertex presentation and required some intrauterine manual manipulation until vacuum could be applied. At birth infant had a spontaneous cry and fair tone followed by apnea.   With tactile stimulation infant did improve tone but first  required IPPV with bag/mask ventilation for less than 1 minute and 100% FiO2 between minute 1 and 2 of extrauterine  life.   Heart rate had remained well over 100 throughout.  By 5 minutes infant's apgar was 10. Initial apgar score was 6 points with losses of two pints each for color & respiration at one minute   Care deferred to OR RN to achieve skin to skin with mother and to the assigned pediatrician.   Dagoberto Ligas MD [facility]

## 2011-09-20 NOTE — Anesthesia Postprocedure Evaluation (Signed)
  Anesthesia Post-op Note  Patient: [name]  Procedure(s) Performed: Procedure(s) (LRB): CESAREAN SECTION (N/A)  Patient is awake, responsive, moving her legs, and has signs of resolution of her numbness. Pain and nausea are reasonably well controlled. Vital signs are stable and clinically acceptable. Oxygen saturation is clinically acceptable. There are no apparent anesthetic complications at this time. Patient is ready for discharge.

## 2011-09-20 NOTE — Progress Notes (Signed)
Patient ID: [name], female   DOB: 1976/03/03, 36 y.o.   MRN: [id number]  Vtx confirmed by bedside US AROM performed for copious fluid, fundal pressure relieved and baby not felt to be vtx Bedside US confirms transverse back down FHR 140-150s mod variability

## 2011-09-20 NOTE — Transfer of Care (Signed)
Immediate Anesthesia Transfer of Care Note  Patient: [name]  Procedure(s) Performed: Procedure(s) (LRB): CESAREAN SECTION (N/A)  Patient Location: PACU  Anesthesia Type: Spinal  Level of Consciousness: awake  Airway & Oxygen Therapy: Patient Spontanous Breathing  Post-op Assessment: Post -op Vital signs reviewed and stable  Post vital signs: Reviewed and stable  Complications: No apparent anesthesia complications

## 2011-09-20 NOTE — Anesthesia Preprocedure Evaluation (Addendum)
Anesthesia Evaluation  Patient identified by MRN, date of birth, ID band Patient awake    Reviewed: Allergy & Precautions, H&P , NPO status , Patient's Chart, lab work & pertinent test results  Airway Mallampati: III      Dental No notable dental hx.    Pulmonary neg pulmonary ROS,  breath sounds clear to auscultation  Pulmonary exam normal       Cardiovascular Exercise Tolerance: Good hypertension, negative cardio ROS  Rhythm:regular Rate:Normal     Neuro/Psych  Neuromuscular disease negative neurological ROS  negative psych ROS   GI/Hepatic negative GI ROS, Neg liver ROS,   Endo/Other  negative endocrine ROS  Renal/GU negative Renal ROS  negative genitourinary   Musculoskeletal   Abdominal Normal abdominal exam  (+)   Peds  Hematology negative hematology ROS (+)   Anesthesia Other Findings Anxiety     Fibromyalgia        Depression     Fibroids        History of preterm labor     History of recurrent UTI (urinary tract infection)        Uterine fibroids affecting pregnancy     Obese        Polyhydramnios 09/20/2011   Mild preeclampsia    Reproductive/Obstetrics (+) Pregnancy                           Anesthesia Physical Anesthesia Plan  ASA: III and Emergent  Anesthesia Plan: Spinal   Post-op Pain Management:    Induction:   Airway Management Planned:   Additional Equipment:   Intra-op Plan:   Post-operative Plan:   Informed Consent: I have reviewed the patients History and Physical, chart, labs and discussed the procedure including the risks, benefits and alternatives for the proposed anesthesia with the patient or authorized representative who has indicated his/her understanding and acceptance.     Plan Discussed with: Anesthesiologist, CRNA and Surgeon  Anesthesia Plan Comments:         Anesthesia Quick Evaluation

## 2011-09-20 NOTE — Addendum Note (Signed)
Addendum  created 09/20/11 1600 by Christene Lye, CRNA   Modules edited:Notes Section

## 2011-09-20 NOTE — Anesthesia Procedure Notes (Signed)
Spinal  Patient location during procedure: OR Start time: 09/20/2011 9:39 AM Staffing Anesthesiologist: Brayton Caves R Performed by: anesthesiologist  Preanesthetic Checklist Completed: patient identified, site marked, surgical consent, pre-op evaluation, timeout performed, IV checked, risks and benefits discussed and monitors and equipment checked Spinal Block Patient position: sitting Prep: DuraPrep Patient monitoring: heart rate, cardiac monitor, continuous pulse ox and blood pressure Approach: midline Location: L3-4 Injection technique: single-shot Needle Needle type: Sprotte  Needle gauge: 24 G Needle length: 9 cm Assessment Sensory level: T4 Additional Notes Patient identified.  Risk benefits discussed including failed block, incomplete pain control, headache, nerve damage, paralysis, blood pressure changes, nausea, vomiting, reactions to medication both toxic or allergic, and postpartum back pain.  Patient expressed understanding and wished to proceed.  All questions were answered.  Sterile technique used throughout procedure.  CSF was clear.  No parasthesia or other complications.  Please see nursing notes for vital signs.

## 2011-09-20 NOTE — Op Note (Signed)
NAME:  [name], [name] NO.:  [id number]  MEDICAL RECORD NO.:  [id number]  LOCATION:  [facility]                          FACILITY:  [facility]  PHYSICIAN:  Sherron Monday, MD        DATE OF BIRTH:  1975-08-26  DATE OF PROCEDURE:  09/20/2011 DATE OF DISCHARGE:                              OPERATIVE REPORT   PREOPERATIVE DIAGNOSES:  Intrauterine pregnancy at 37+ weeks, status post AROM with transverse lie, preeclampsia.  POSTOPERATIVE DIAGNOSES:  Intrauterine pregnancy at 37+ weeks, status post AROM with transverse lie, preeclampsia, delivered.  PROCEDURE:  Primary low-transverse cesarean section.  ANESTHESIA:  Spinal.  SURGEON:  Sherron Monday, MD.  ESTIMATED BLOOD LOSS:  800 mL.  IV FLUIDS:  1300 mL.  URINE OUTPUT:  150 mL clear urine at the end of the procedure.  COMPLICATIONS:  None aside from difficult delivery secondary to transverse back down.  PATHOLOGY:  Placenta to L and D.  PROCEDURE IN DETAIL:  After informed consent was reviewed with the patient including risks, benefits, and alternatives of the surgical procedure, she was transported to the OR, where spinal anesthesia was induced and found be adequate.  She was then prepped and draped in the normal sterile fashion.  Her Foley catheter was sterilely placed.  A Pfannenstiel skin incision was made at the level approximately 2 fingerbreadths above the pubic symphysis, carried through the underlying layer of fascia sharply.  The fascia was incised in the midline.  The midline incision was extended laterally with Mayo scissors.  Inferior aspect of the fascial incision was grasped with Kocher clamps, elevating the rectus muscles were dissected off both bluntly and sharply.  Attention was then turned to the abdominal portion of the case. Superior portion of the fascia was grasped with Kocher and elevated. The rectus muscles were dissected off both bluntly and sharply. Peritoneum was entered bluntly.  Peritoneal  incision was extended superiorly and inferiorly with good visualization of the bladder.  The Alexis skin retractor was placed carefully making sure no bowel was entrapped.  The vascular uterine peritoneum was easily visualized, tented up with smooth pickups, and the bladder flap was created digitally and sharply.  Uterus was incised in transverse fashion as this was a non laboring uterus, somewhat thick, the thought was that the C- section had been performed at high station.  The patient was told of this in future pregnancies for cesarean section for delivery secondary to the high transverse cesarean section.  Infant was delivered from a vertex presentation after eversion in utero with aid of a vacuum.  The nose and mouth were suctioned on the field.  Cord was clamped and cut. Infant was handed off to the awaiting pediatric staff.  The placenta was expressed from the uterus.  The uterus was cleared of all clots and debris.  The uterine incision was closed in 2 layers of 0 Monocryl.  The first was running locked and the 2nd as an imbricating layer. Hemostasis was assured.  Copious pelvic irrigation was performed.  The Alexis retractor was removed.  The peritoneum was reapproximated with 2- 0 Vicryl in a running fashion.  The subfascial planes were inspected  and found to be hemostatic.  The fascia was closed with 0 Vicryl in a running fashion.  The subcuticular adipose layer was made hemostatic with Bovie cautery, irrigated, and the dead space was reapproximated using 3-0 plain gut.  Skin was closed with 3-0 Vicryl.  Benzoin and Steri-Strips were applied.  The patient tolerated the procedure well. Sponge, lap, and needle counts were correct x2 at the end of the procedure.     Sherron Monday, MD     JB/MEDQ  D:  09/20/2011  T:  09/20/2011  Job:  [id number]

## 2011-09-20 NOTE — H&P (Signed)
[name] is a 36 y.o. female [phone] at 37+6 with known mild preeclampsia and polyhydramnios for iol.  Had been breech in office verted to vertex at last check, will verify.  +FM, no LOF, no VB, occ ctx. Maternal Medical History:  Fetal activity: Perceived fetal activity is normal.    Prenatal complications: Polyhydramnios.     OB History    Grav Para Term Preterm Abortions TAB SAB Ect Mult Living   4 2 0 2 1 0 1 0 0 2     G1&2 36wk SVD female, female 7#14-8#15; g3 SAB, G4 present poly, PIH; no abn pap, no STD H/o PTL Past Medical History  Diagnosis Date  . Anxiety   . Fibromyalgia   . Depression   . Fibroids   . History of preterm labor   . History of recurrent UTI (urinary tract infection)   . Uterine fibroids affecting pregnancy   . Obese   . Polyhydramnios 09/20/2011  . Mild preeclampsia 09/20/2011   Past Surgical History  Procedure Date  . Knee surgery   . Cholecystectomy    Family History: family history includes Asthma in her sister; Cancer in her mother; Depression in her maternal grandfather; Diabetes in her maternal grandmother; Heart disease in her father; Hypertension in her father and mother; Mental illness in her sister; Ovarian cysts in her sister; and Seizures in her sister. Social History:  reports that she has been smoking Cigarettes.  She has been smoking about 1 pack per day. She has never used smokeless tobacco. She reports that she does not drink alcohol or use illicit drugs.engaged  Meds PNV All Codeine  Review of Systems  Constitutional: Negative.   HENT: Negative.   Eyes: Negative.   Respiratory: Negative.   Cardiovascular: Negative.   Gastrointestinal: Negative.   Genitourinary: Negative.   Musculoskeletal: Negative.   Skin: Negative.   Neurological: Negative.   Psychiatric/Behavioral: Negative.       Height 5\' 9"  (1.753 m), weight 99.791 kg (220 lb), last menstrual period 12/29/2010. Maternal Exam:  Abdomen: Patient reports generalized  tenderness.  Fundal height is 40+ in office, known polyhydramnios.   Estimated fetal weight is 7#.       Physical Exam  Constitutional: She is oriented to person, place, and time. She appears well-developed and well-nourished.  HENT:  Head: Normocephalic and atraumatic.  Neck: Normal range of motion. Neck supple. No thyromegaly present.  Cardiovascular: Normal rate and regular rhythm.   Respiratory: Effort normal and breath sounds normal.  GI: Soft. Bowel sounds are normal. There is generalized tenderness.  Musculoskeletal: Normal range of motion.  Neurological: She is alert and oriented to person, place, and time.  Skin: Skin is warm and dry.  Psychiatric: She has a normal mood and affect. Her behavior is normal.    Prenatal labs: ABO, Rh: O/Positive/-- (10/15 0000) Antibody: Negative (10/15 0000) Rubella: Immune (10/15 0000) RPR: Nonreactive (10/15 0000)  HBsAg: Negative (10/15 0000)  HIV: Non-reactive (10/15 0000)  GBS: Negative (03/14 0000)  Hgb 11.4, Pap WNL, HR HPV neg, Plt 215K, GC neg, Chl neg, CF neg, glucola 114, QS 1:215 DSR 24 hr urine with 385 mg protein, nl PIH labs   [place], cwd, nl anat x limited heart, post plac, female F/u nl anat 3/28 breech, female, poly,EFW 6#13  Assessment/Plan: 14NW [id number] at 37+ with PreE and polyhydramnios Verify vertex If vtx pitocin to augment, epidural prn, stadol prn    BOVARD,[name] 09/20/2011, 8:28 AM

## 2011-09-20 NOTE — Anesthesia Postprocedure Evaluation (Signed)
  Anesthesia Post-op Note  Patient: [name]  Procedure(s) Performed: Procedure(s) (LRB): CESAREAN SECTION (N/A)  Patient Location: [facility]  Anesthesia Type: Spinal  Level of Consciousness: awake, alert  and oriented  Airway and Oxygen Therapy: Patient Spontanous Breathing  Post-op Pain: none  Post-op Assessment: Post-op Vital signs reviewed, Patient's Cardiovascular Status Stable, No headache, No backache, No residual numbness and No residual motor weakness  Post-op Vital Signs: Reviewed and stable  Complications: No apparent anesthesia complications

## 2011-09-21 ENCOUNTER — Encounter (HOSPITAL_COMMUNITY): Payer: Self-pay | Admitting: Obstetrics and Gynecology

## 2011-09-21 LAB — CBC
MCHC: 32.1 g/dL (ref 30.0–36.0)
Platelets: 354 10*3/uL (ref 150–400)
RDW: 14 % (ref 11.5–15.5)

## 2011-09-21 NOTE — Progress Notes (Signed)
UR chart review completed.  

## 2011-09-21 NOTE — Progress Notes (Signed)
Subjective: Postpartum Day 1: Cesarean Delivery Patient reports incisional pain and tolerating PO.  Nl lochia, pain controlled  Objective: Vital signs in last 24 hours: Temp:  [97.9 F (36.6 C)-99.4 F (37.4 C)] 99.4 F (37.4 C) (04/03 0526) Pulse Rate:  [68-90] 78  (04/03 0526) Resp:  [16-27] 20  (04/03 0526) BP: (100-145)/(58-91) 111/69 mmHg (04/03 0526) SpO2:  [94 %-100 %] 96 % (04/03 0526)  Physical Exam:  General: alert and no distress Lochia: appropriate Uterine Fundus: firm Incision: bandage intact DVT Evaluation: No evidence of DVT seen on physical exam.   Basename 09/21/11 0545 09/20/11 0810  HGB 8.7* 11.5*  HCT 27.1* 34.7*    Assessment/Plan: Status post Cesarean section. Doing well postoperatively.  Continue current care.  BOVARD,Samantha Olivera 09/21/2011, 8:31 AM

## 2011-09-22 MED ORDER — OXYCODONE-ACETAMINOPHEN 5-325 MG PO TABS: 1.0000 | ORAL_TABLET | ORAL | Status: AC | PRN

## 2011-09-22 MED ORDER — IBUPROFEN 800 MG PO TABS: 800.0000 mg | ORAL_TABLET | Freq: Three times a day (TID) | ORAL | Status: AC

## 2011-09-22 MED ORDER — PRENATAL MULTIVITAMIN CH: 1.0000 | ORAL_TABLET | Freq: Every day | ORAL | Status: DC

## 2011-09-22 NOTE — Progress Notes (Signed)
Subjective: Postpartum Day 2: Cesarean Delivery Patient reports incisional pain, tolerating PO and no problems voiding.  Pain controlled, nl lochia; desires d/c home.  Objective: Vital signs in last 24 hours: Temp:  [98 F (36.7 C)-98.5 F (36.9 C)] 98.4 F (36.9 C) (04/04 0700) Pulse Rate:  [83-89] 83  (04/04 0700) Resp:  [20] 20  (04/04 0700) BP: (125-129)/(67-83) 125/83 mmHg (04/04 0700) SpO2:  [99 %] 99 % (04/04 0700)  Physical Exam:  General: alert and no distress Lochia: appropriate Uterine Fundus: firm Incision: healing well DVT Evaluation: No evidence of DVT seen on physical exam.   Basename 09/21/11 0545 09/20/11 0810  HGB 8.7* 11.5*  HCT 27.1* 34.7*    Assessment/Plan: Status post Cesarean section. Doing well postoperatively.  Discharge home with standard precautions and return to clinic in 2 weeks.  Also for circumcision in office.  D/c with Motrin/Percocet/ PNV  BOVARD,Nadyne Gariepy 09/22/2011, 9:42 AM

## 2011-09-22 NOTE — Discharge Summary (Signed)
Obstetric Discharge Summary Reason for Admission: induction of labor Prenatal Procedures: none, AROM then malpresentation so LTCS Intrapartum Procedures: cesarean: low cervical, transverse Postpartum Procedures: none Complications-Operative and Postpartum: none Hemoglobin  Date Value Range Status  09/21/2011 8.7* 12.0-15.0 (g/dL) Final     REPEATED TO VERIFY     DELTA CHECK NOTED     HCT  Date Value Range Status  09/21/2011 27.1* 36.0-46.0 (%) Final    Physical Exam:  General: alert and no distress Lochia: appropriate Uterine Fundus: firm Incision: healing well DVT Evaluation: No evidence of DVT seen on physical exam.  Discharge Diagnoses: Term Pregnancy-delivered  Discharge Information: Date: 09/22/2011 Activity: pelvic rest Diet: routine Medications: PNV, Ibuprofen and Percocet Condition: stable Instructions: refer to practice specific booklet Discharge to: home Follow-up Information    Follow up with BOVARD,Dariana Garbett, MD. Schedule an appointment as soon as possible for a visit in 2 weeks.   Contact information:   [address] [facility] [address] [place] [address] [phone]          Newborn Data: Live born female  Birth Weight: 7 lb 11.3 oz (3495 g) APGAR: 6, 10  Home with mother.  BOVARD,Shahd Occhipinti 09/22/2011, 9:49 AM

## 2011-09-26 ENCOUNTER — Other Ambulatory Visit (HOSPITAL_COMMUNITY): Payer: Medicaid Other

## 2011-09-30 ENCOUNTER — Inpatient Hospital Stay (HOSPITAL_COMMUNITY)
Admission: RE | Admit: 2011-09-30 | Payer: Medicaid Other | Source: Ambulatory Visit | Admitting: Obstetrics and Gynecology

## 2011-09-30 ENCOUNTER — Encounter (HOSPITAL_COMMUNITY): Admission: RE | Payer: Self-pay | Source: Ambulatory Visit

## 2011-09-30 SURGERY — Surgical Case
Anesthesia: Choice | Laterality: Bilateral

## 2011-11-23 ENCOUNTER — Encounter (HOSPITAL_COMMUNITY): Payer: Self-pay

## 2011-12-01 NOTE — Patient Instructions (Addendum)
YOUR PROCEDURE IS SCHEDULED ON:12/07/11  ENTER THROUGH THE MAIN ENTRANCE OF [facility] AT:7am  USE DESK PHONE AND DIAL [phone] TO INFORM us OF YOUR ARRIVAL  CALL [phone] IF YOU HAVE ANY QUESTIONS OR PROBLEMS PRIOR TO YOUR ARRIVAL.  REMEMBER: DO NOT EAT OR DRINK AFTER MIDNIGHT :Tuesday    YOU MAY BRUSH YOUR TEETH THE MORNING OF SURGERY   TAKE THESE MEDICINES THE DAY OF SURGERY WITH SIP OF WATER:none   DO NOT WEAR JEWELRY, EYE MAKEUP, LIPSTICK OR DARK FINGERNAIL POLISH DO NOT WEAR LOTIONS  DO NOT SHAVE FOR 48 HOURS PRIOR TO SURGERY  YOU WILL NOT BE ALLOWED TO DRIVE YOURSELF HOME.  NAME OF DRIVER: Prescott Parma

## 2011-12-02 ENCOUNTER — Encounter (HOSPITAL_COMMUNITY)
Admission: RE | Admit: 2011-12-02 | Discharge: 2011-12-02 | Disposition: A | Discharge status: Home / Self Care | Payer: Medicaid Other | Source: Ambulatory Visit | Attending: Obstetrics and Gynecology | Admitting: Obstetrics and Gynecology

## 2011-12-02 ENCOUNTER — Encounter (HOSPITAL_COMMUNITY): Payer: Self-pay

## 2011-12-02 LAB — SURGICAL PCR SCREEN
MRSA, PCR: NEGATIVE
Staphylococcus aureus: NEGATIVE

## 2011-12-02 LAB — CBC
HCT: 39.2 % (ref 36.0–46.0)
Platelets: 270 10*3/uL (ref 150–400)
RDW: 13 % (ref 11.5–15.5)
WBC: 13.5 10*3/uL — ABNORMAL HIGH (ref 4.0–10.5)

## 2011-12-06 ENCOUNTER — Encounter (HOSPITAL_COMMUNITY): Payer: Self-pay | Admitting: Obstetrics and Gynecology

## 2011-12-06 DIAGNOSIS — Z302 Encounter for sterilization: Secondary | ICD-10-CM

## 2011-12-06 HISTORY — DX: Encounter for sterilization: Z30.2

## 2011-12-06 NOTE — H&P (Signed)
[name] is an 36 y.o. female.[id number] s/p primary cesarean section 4/13 with undesired fertility.  D/w pt other methods and permanence of BTL.  D/W pt r/b/a including failure with increased risk of ectopic pregnancy.    Pertinent Gynecological History: Sxually transmitted diseases:none Previous GYN Procedures: none   No abn pap OB History: [id number], G1 &2 36 wk SVD, G3 SAB, G4 37wk LTCS, PreE, Transverse lie    No LMP recorded.    Past Medical History  Diagnosis Date  . Anxiety   . Fibromyalgia   . Depression   . Fibroids   . History of preterm labor   . History of recurrent UTI (urinary tract infection)   . Uterine fibroids affecting pregnancy   . Obese   . Polyhydramnios 09/20/2011  . S/P cesarean section 09/20/2011  . Mild preeclampsia 09/20/2011    PIH  . Encounter for female sterilization procedure 12/06/2011    Past Surgical History  Procedure Date  . Knee surgery   . Cholecystectomy   . Cesarean section 09/20/2011    Procedure: CESAREAN SECTION;  Surgeon: Sherron Monday, MD;  Location: [facility];  Service: Gynecology;  Laterality: N/A;    Family History  Problem Relation Age of Onset  . Cancer Mother     breast  . Hypertension Mother   . Heart disease Father   . Hypertension Father   . Asthma Sister   . Mental illness Sister     bipolar, schizophrenia,   . Ovarian cysts Sister   . Seizures Sister   . Diabetes Maternal Grandmother   . Depression Maternal Grandfather     suicide    Social History:  reports that she has been smoking Cigarettes.  She has been smoking about 1 pack per day. She has never used smokeless tobacco. She reports that she drinks alcohol. She reports that she does not use illicit drugs.engaged Meds PNV, Zoloft  Allergies:  Allergies  Allergen Reactions  . Hydrocodone-Acetaminophen Nausea And Vomiting    And dizziness    No prescriptions prior to admission    Review of Systems  Constitutional: Negative.   HENT: Negative.   Eyes:  Negative.   Respiratory: Negative.   Cardiovascular: Negative.   Gastrointestinal: Negative.   Genitourinary: Negative.   Musculoskeletal: Negative.   Skin: Negative.   Neurological: Negative.   Psychiatric/Behavioral: Negative.     not currently breastfeeding. Physical Exam  Constitutional: She is oriented to person, place, and time. She appears well-developed and well-nourished.  HENT:  Head: Normocephalic and atraumatic.  Eyes: Conjunctivae are normal. Pupils are equal, round, and reactive to light.  Neck: Normal range of motion. Neck supple. No thyromegaly present.  Cardiovascular: Normal rate and regular rhythm.   Respiratory: Effort normal and breath sounds normal. No respiratory distress.  GI: Soft. Bowel sounds are normal. There is no tenderness.  Musculoskeletal: Normal range of motion.  Neurological: She is alert and oriented to person, place, and time.  Skin: Skin is warm and dry.  Psychiatric: She has a normal mood and affect. Her behavior is normal.    O+  Assessment/Plan: 36yo [id number] for BTL, d/w pt r/b/a, including increased risk of ectopic pregnancy.  Will proceed  BOVARD,Kirah Stice 12/06/2011, 9:14 PM

## 2011-12-07 ENCOUNTER — Ambulatory Visit (HOSPITAL_COMMUNITY)
Admission: RE | Admit: 2011-12-07 | Discharge: 2011-12-07 | Disposition: A | Discharge status: Home / Self Care | Payer: Medicaid Other | Source: Ambulatory Visit | Attending: Obstetrics and Gynecology | Admitting: Obstetrics and Gynecology

## 2011-12-07 ENCOUNTER — Ambulatory Visit (HOSPITAL_COMMUNITY): Payer: Medicaid Other | Admitting: Anesthesiology

## 2011-12-07 ENCOUNTER — Encounter (HOSPITAL_COMMUNITY)
Admission: RE | Disposition: A | Discharge status: Home / Self Care | Payer: Self-pay | Source: Ambulatory Visit | Attending: Obstetrics and Gynecology

## 2011-12-07 ENCOUNTER — Encounter (HOSPITAL_COMMUNITY): Payer: Self-pay | Admitting: Obstetrics and Gynecology

## 2011-12-07 ENCOUNTER — Encounter (HOSPITAL_COMMUNITY): Payer: Self-pay | Admitting: Anesthesiology

## 2011-12-07 DIAGNOSIS — Z302 Encounter for sterilization: Secondary | ICD-10-CM | POA: Insufficient documentation

## 2011-12-07 DIAGNOSIS — Z01818 Encounter for other preprocedural examination: Secondary | ICD-10-CM | POA: Insufficient documentation

## 2011-12-07 DIAGNOSIS — Z01812 Encounter for preprocedural laboratory examination: Secondary | ICD-10-CM | POA: Insufficient documentation

## 2011-12-07 DIAGNOSIS — Z9851 Tubal ligation status: Secondary | ICD-10-CM

## 2011-12-07 HISTORY — DX: Tubal ligation status: Z98.51

## 2011-12-07 HISTORY — PX: LAPAROSCOPIC TUBAL LIGATION: SHX1937

## 2011-12-07 HISTORY — DX: Encounter for sterilization: Z30.2

## 2011-12-07 LAB — PREGNANCY, URINE: Preg Test, Ur: NEGATIVE

## 2011-12-07 SURGERY — LIGATION, FALLOPIAN TUBE, LAPAROSCOPIC
Anesthesia: General | Laterality: Bilateral

## 2011-12-07 MED ORDER — BUPIVACAINE HCL (PF) 0.25 % IJ SOLN
INTRAMUSCULAR | Status: AC
  Filled 2011-12-07: qty 30

## 2011-12-07 MED ORDER — PROPOFOL 10 MG/ML IV EMUL
INTRAVENOUS | Status: DC | PRN
  Administered 2011-12-07: 40 mg via INTRAVENOUS
  Administered 2011-12-07: 180 mg via INTRAVENOUS

## 2011-12-07 MED ORDER — LACTATED RINGERS IV SOLN
INTRAVENOUS | Status: DC
  Administered 2011-12-07 (×2): via INTRAVENOUS

## 2011-12-07 MED ORDER — IBUPROFEN 800 MG PO TABS: 800.0000 mg | ORAL_TABLET | Freq: Three times a day (TID) | ORAL | Status: AC | PRN

## 2011-12-07 MED ORDER — FENTANYL CITRATE 0.05 MG/ML IJ SOLN
INTRAMUSCULAR | Status: AC
  Filled 2011-12-07: qty 2

## 2011-12-07 MED ORDER — ROCURONIUM BROMIDE 100 MG/10ML IV SOLN
INTRAVENOUS | Status: DC | PRN
  Administered 2011-12-07: 25 mg via INTRAVENOUS
  Administered 2011-12-07: 10 mg via INTRAVENOUS

## 2011-12-07 MED ORDER — GLYCOPYRROLATE 0.2 MG/ML IJ SOLN
INTRAMUSCULAR | Status: AC
  Filled 2011-12-07: qty 1

## 2011-12-07 MED ORDER — FENTANYL CITRATE 0.05 MG/ML IJ SOLN
INTRAMUSCULAR | Status: AC
  Administered 2011-12-07: 50 ug via INTRAVENOUS
  Filled 2011-12-07: qty 2

## 2011-12-07 MED ORDER — ONDANSETRON HCL 4 MG/2ML IJ SOLN
INTRAMUSCULAR | Status: DC | PRN
  Administered 2011-12-07: 4 mg via INTRAVENOUS

## 2011-12-07 MED ORDER — FENTANYL CITRATE 0.05 MG/ML IJ SOLN
25.0000 ug | INTRAMUSCULAR | Status: DC | PRN
  Administered 2011-12-07 (×2): 50 ug via INTRAVENOUS

## 2011-12-07 MED ORDER — ONDANSETRON HCL 4 MG/2ML IJ SOLN
INTRAMUSCULAR | Status: AC
  Filled 2011-12-07: qty 2

## 2011-12-07 MED ORDER — KETOROLAC TROMETHAMINE 30 MG/ML IJ SOLN
INTRAMUSCULAR | Status: AC
  Filled 2011-12-07: qty 1

## 2011-12-07 MED ORDER — SILVER NITRATE-POT NITRATE 75-25 % EX MISC
CUTANEOUS | Status: AC
  Filled 2011-12-07: qty 3

## 2011-12-07 MED ORDER — MIDAZOLAM HCL 2 MG/2ML IJ SOLN
INTRAMUSCULAR | Status: AC
  Filled 2011-12-07: qty 2

## 2011-12-07 MED ORDER — BUPIVACAINE HCL (PF) 0.25 % IJ SOLN
INTRAMUSCULAR | Status: DC | PRN
  Administered 2011-12-07: 30 mL

## 2011-12-07 MED ORDER — LACTATED RINGERS IV SOLN: INTRAVENOUS | Status: DC

## 2011-12-07 MED ORDER — NEOSTIGMINE METHYLSULFATE 1 MG/ML IJ SOLN
INTRAMUSCULAR | Status: DC | PRN
  Administered 2011-12-07: 1 mg via INTRAVENOUS

## 2011-12-07 MED ORDER — ROCURONIUM BROMIDE 50 MG/5ML IV SOLN
INTRAVENOUS | Status: AC
  Filled 2011-12-07: qty 1

## 2011-12-07 MED ORDER — LIDOCAINE HCL (CARDIAC) 20 MG/ML IV SOLN
INTRAVENOUS | Status: DC | PRN
  Administered 2011-12-07: 50 mg via INTRAVENOUS

## 2011-12-07 MED ORDER — DEXAMETHASONE SODIUM PHOSPHATE 10 MG/ML IJ SOLN
INTRAMUSCULAR | Status: AC
  Filled 2011-12-07: qty 1

## 2011-12-07 MED ORDER — KETOROLAC TROMETHAMINE 30 MG/ML IJ SOLN: 15.0000 mg | Freq: Once | INTRAMUSCULAR | Status: DC | PRN

## 2011-12-07 MED ORDER — GLYCOPYRROLATE 0.2 MG/ML IJ SOLN
INTRAMUSCULAR | Status: DC | PRN
  Administered 2011-12-07: 0.2 mg via INTRAVENOUS

## 2011-12-07 MED ORDER — MIDAZOLAM HCL 5 MG/5ML IJ SOLN
INTRAMUSCULAR | Status: DC | PRN
  Administered 2011-12-07: 2 mg via INTRAVENOUS

## 2011-12-07 MED ORDER — OXYCODONE-ACETAMINOPHEN 5-325 MG PO TABS: 1.0000 | ORAL_TABLET | Freq: Four times a day (QID) | ORAL | Status: AC | PRN

## 2011-12-07 MED ORDER — PROPOFOL 10 MG/ML IV EMUL
INTRAVENOUS | Status: AC
  Filled 2011-12-07: qty 20

## 2011-12-07 MED ORDER — FENTANYL CITRATE 0.05 MG/ML IJ SOLN
INTRAMUSCULAR | Status: DC | PRN
  Administered 2011-12-07 (×2): 100 ug via INTRAVENOUS

## 2011-12-07 MED ORDER — LIDOCAINE HCL (CARDIAC) 20 MG/ML IV SOLN
INTRAVENOUS | Status: AC
  Filled 2011-12-07: qty 5

## 2011-12-07 MED ORDER — KETOROLAC TROMETHAMINE 30 MG/ML IJ SOLN
INTRAMUSCULAR | Status: DC | PRN
  Administered 2011-12-07: 30 mg via INTRAVENOUS

## 2011-12-07 MED ORDER — NEOSTIGMINE METHYLSULFATE 1 MG/ML IJ SOLN
INTRAMUSCULAR | Status: AC
  Filled 2011-12-07: qty 10

## 2011-12-07 MED ORDER — DEXAMETHASONE SODIUM PHOSPHATE 10 MG/ML IJ SOLN
INTRAMUSCULAR | Status: DC | PRN
  Administered 2011-12-07: 10 mg via INTRAVENOUS

## 2011-12-07 SURGICAL SUPPLY — 18 items
CATH ROBINSON RED A/P 16FR (CATHETERS) ×2 IMPLANT
CHLORAPREP W/TINT 26ML (MISCELLANEOUS) ×2 IMPLANT
CLOTH BEACON ORANGE TIMEOUT ST (SAFETY) ×2 IMPLANT
DERMABOND ADVANCED (GAUZE/BANDAGES/DRESSINGS) ×1
DERMABOND ADVANCED .7 DNX12 (GAUZE/BANDAGES/DRESSINGS) ×1 IMPLANT
GLOVE BIO SURGEON STRL SZ 6.5 (GLOVE) ×2 IMPLANT
GLOVE BIO SURGEON STRL SZ7 (GLOVE) ×2 IMPLANT
GOWN PREVENTION PLUS LG XLONG (DISPOSABLE) ×4 IMPLANT
NEEDLE INSUFFLATION 14GA 120MM (NEEDLE) ×2 IMPLANT
PACK LAPAROSCOPY BASIN (CUSTOM PROCEDURE TRAY) ×2 IMPLANT
SUT VIC AB 3-0 CTX 36 (SUTURE) IMPLANT
SUT VIC AB 3-0 PS2 18 (SUTURE) ×1
SUT VIC AB 3-0 PS2 18XBRD (SUTURE) ×1 IMPLANT
SUT VICRYL 0 UR6 27IN ABS (SUTURE) IMPLANT
TOWEL OR 17X24 6PK STRL BLUE (TOWEL DISPOSABLE) ×4 IMPLANT
TROCAR XCEL NON-BLD 11X100MML (ENDOMECHANICALS) ×2 IMPLANT
TROCAR XCEL NON-BLD 5MMX100MML (ENDOMECHANICALS) ×4 IMPLANT
WATER STERILE IRR 1000ML POUR (IV SOLUTION) ×2 IMPLANT

## 2011-12-07 NOTE — Op Note (Signed)
NAME:  MELITA, VILLALONA NO.:  [id number]  MEDICAL RECORD NO.:  [id number]  LOCATION:  [facility]                          FACILITY:  [facility]  PHYSICIAN:  Sherron Monday, MD        DATE OF BIRTH:  1975-10-22  DATE OF PROCEDURE:  12/07/2011 DATE OF DISCHARGE:                              OPERATIVE REPORT   PREOPERATIVE DIAGNOSIS:  Undesired fertility.  POSTOPERATIVE DIAGNOSIS:  Undesired fertility.  PROCEDURE:  Bilateral tubal ligation by fulguration.  SURGEON:  Sherron Monday, MD  ANESTHESIA:  General.  EBL:  Minimal.  IV FLUIDS:  1200 mL.  URINE OUTPUT:  250 mL.  LOCAL MEDICATION:  0.25% Marcaine, 8 mL.  COMPLICATIONS:  None.  PATHOLOGY:  None.  PROCEDURE:  After informed consent was reviewed with the patient including risks, benefits, and alternatives of the tubal ligation, she was transported to the OR, placed on table in supine position.  General anesthesia was induced and found to be adequate.  She was then placed in the elephant stirrups, prepped and draped in the normal sterile fashion. Her bladder was sterilely drained.  Using an open-sided speculum, her cervix was easily visualized and a Hulka tenaculum was placed.  Closed attention was turned to the abdominal portion of the case and 1 to 2 cm infraumbilical incision was made at the level of her previous laparoscopy incision.  The underlying fascia was grasped with Kocher clamps and elevated using the Veress needle.  Pneumoperitoneum was obtained after hanging drop test.  The abdomen was insufflated after direct visualization for placement of a 10-mm trocar.  The uterus was noted to be heavily wedged with a fibroid into the pelvis.  Decision was made to put in accessory port to aid with visualization.  Initially the port was placed on the right side carefully making sure the epigastric vessels were not disturbed.  The 5 mm port was placed.  Attention was then turned to the left side and in a similar  fashion, a 5-mm port was placed.  Using a million dollar grasper, the tube was followed out to the fimbriated end.  The cornu was elevated and fulgurated in the triple burn technique on either side.  Hemostasis was assured.  The trocars were removed under direct visualization.  Noted to be hemostatic at removal. The patient was returned to the supine position.  The ports were closed with a deep suture of 3-0 Vicryl.  The skin was closed with Dermabond. The patient tolerated the procedure well.  Sponge, lap, and needle counts were correct x2.     Sherron Monday, MD     JB/MEDQ  D:  12/07/2011  T:  12/07/2011  Job:  [id number]

## 2011-12-07 NOTE — Transfer of Care (Signed)
Immediate Anesthesia Transfer of Care Note  Patient: [name]  Procedure(s) Performed: Procedure(s) (LRB): LAPAROSCOPIC TUBAL LIGATION (Bilateral)  Patient Location: PACU  Anesthesia Type: General  Level of Consciousness: sedated  Airway & Oxygen Therapy: Patient Spontanous Breathing and Patient connected to nasal cannula oxygen  Post-op Assessment: Report given to PACU RN  Post vital signs: Reviewed and stable  Complications: No apparent anesthesia complications

## 2011-12-07 NOTE — Anesthesia Preprocedure Evaluation (Signed)
Anesthesia Evaluation  Patient identified by MRN, date of birth, ID band Patient awake    Reviewed: Allergy & Precautions, H&P , NPO status , Patient's Chart, lab work & pertinent test results, reviewed documented beta blocker date and time   History of Anesthesia Complications Negative for: history of anesthetic complications  Airway Mallampati: II TM Distance: >3 FB Neck ROM: full    Dental  (+) Teeth Intact   Pulmonary Current Smoker,  breath sounds clear to auscultation  Pulmonary exam normal       Cardiovascular Exercise Tolerance: Good Rhythm:regular Rate:Normal     Neuro/Psych PSYCHIATRIC DISORDERS (depression)  Neuromuscular disease (fibromyalgia)    GI/Hepatic negative GI ROS, Neg liver ROS,   Endo/Other  negative endocrine ROS  Renal/GU negative Renal ROS  negative genitourinary   Musculoskeletal   Abdominal   Peds  Hematology negative hematology ROS (+)   Anesthesia Other Findings   Reproductive/Obstetrics negative OB ROS                           Anesthesia Physical Anesthesia Plan  ASA: II  Anesthesia Plan: General ETT   Post-op Pain Management:    Induction:   Airway Management Planned:   Additional Equipment:   Intra-op Plan:   Post-operative Plan:   Informed Consent: I have reviewed the patients History and Physical, chart, labs and discussed the procedure including the risks, benefits and alternatives for the proposed anesthesia with the patient or authorized representative who has indicated his/her understanding and acceptance.   Dental Advisory Given  Plan Discussed with: CRNA and Surgeon  Anesthesia Plan Comments:         Anesthesia Quick Evaluation

## 2011-12-07 NOTE — Brief Op Note (Signed)
12/07/2011  9:53 AM  PATIENT:  Arletha Grippe  36 y.o. female  PRE-OPERATIVE DIAGNOSIS:  Desires Sterility  POST-OPERATIVE DIAGNOSIS:  Desires Sterility  PROCEDURE:  Procedure(s) (LRB): LAPAROSCOPIC TUBAL LIGATION (Bilateral)  SURGEON:  Surgeon(s) and Role:    * Sherron Monday, MD - Primary  ANESTHESIA:   general  EBL:  Total I/O In: 1200 [I.V.:1200] Out: 260 [Urine:250; Blood:10]  BLOOD ADMINISTERED:none  DRAINS: none   LOCAL MEDICATIONS USED:  MARCAINE   (0.25%), 8 cc  SPECIMEN:  No Specimen  DISPOSITION OF SPECIMEN:  N/A  COUNTS:  YES  TOURNIQUET:  * No tourniquets in log *  DICTATION: .Other Dictation: Dictation Number [id number]  PLAN OF CARE: Discharge to home after PACU  PATIENT DISPOSITION:  PACU - hemodynamically stable.   Delay start of Pharmacological VTE agent (>24hrs) due to surgical blood loss or risk of bleeding: not applicable

## 2011-12-07 NOTE — Interval H&P Note (Signed)
History and Physical Interval Note:  12/07/2011 8:19 AM  [name]  has presented today for surgery, with the diagnosis of desires sterility  The various methods of treatment have been discussed with the patient and family. After consideration of risks, benefits and other options for treatment, the patient has consented to  Procedure(s) (LRB): LAPAROSCOPIC TUBAL LIGATION (Bilateral) as a surgical intervention .  The patient's history has been reviewed, patient examined, no change in status, stable for surgery.  I have reviewed the patients' chart and labs.  Questions were answered to the patient's satisfaction.     BOVARD,Cathaleen Korol

## 2011-12-07 NOTE — Anesthesia Postprocedure Evaluation (Signed)
Anesthesia Post Note  Patient: [name]  Procedure(s) Performed: Procedure(s) (LRB): LAPAROSCOPIC TUBAL LIGATION (Bilateral)  Anesthesia type: GA  Patient location: PACU  Post pain: Pain level controlled  Post assessment: Post-op Vital signs reviewed  Last Vitals:  Filed Vitals:   12/07/11 1015  BP: 141/117  Pulse: 75  Temp:   Resp: 23    Post vital signs: Reviewed  Level of consciousness: sedated  Complications: No apparent anesthesia complications

## 2011-12-08 ENCOUNTER — Encounter (HOSPITAL_COMMUNITY): Payer: Self-pay | Admitting: Obstetrics and Gynecology

## 2014-01-21 ENCOUNTER — Inpatient Hospital Stay (HOSPITAL_COMMUNITY)
Admission: RE | Admit: 2014-01-21 | Discharge: 2014-01-21 | Disposition: A | Discharge status: Home / Self Care | Payer: Medicaid Other | Source: Ambulatory Visit

## 2014-01-21 ENCOUNTER — Encounter (HOSPITAL_COMMUNITY): Payer: Self-pay

## 2014-01-21 ENCOUNTER — Encounter (HOSPITAL_COMMUNITY)
Admission: RE | Admit: 2014-01-21 | Discharge: 2014-01-21 | Disposition: A | Discharge status: Home / Self Care | Payer: Medicaid Other | Source: Ambulatory Visit | Attending: Obstetrics and Gynecology | Admitting: Obstetrics and Gynecology

## 2014-01-21 ENCOUNTER — Encounter (HOSPITAL_COMMUNITY): Payer: Self-pay | Admitting: Obstetrics and Gynecology

## 2014-01-21 DIAGNOSIS — Z818 Family history of other mental and behavioral disorders: Secondary | ICD-10-CM | POA: Diagnosis not present

## 2014-01-21 DIAGNOSIS — Z803 Family history of malignant neoplasm of breast: Secondary | ICD-10-CM | POA: Diagnosis not present

## 2014-01-21 DIAGNOSIS — R102 Pelvic and perineal pain: Secondary | ICD-10-CM

## 2014-01-21 DIAGNOSIS — D500 Iron deficiency anemia secondary to blood loss (chronic): Secondary | ICD-10-CM | POA: Diagnosis not present

## 2014-01-21 DIAGNOSIS — F341 Dysthymic disorder: Secondary | ICD-10-CM | POA: Diagnosis not present

## 2014-01-21 DIAGNOSIS — D251 Intramural leiomyoma of uterus: Secondary | ICD-10-CM | POA: Diagnosis present

## 2014-01-21 DIAGNOSIS — N8000 Endometriosis of the uterus, unspecified: Secondary | ICD-10-CM | POA: Diagnosis not present

## 2014-01-21 DIAGNOSIS — N809 Endometriosis, unspecified: Secondary | ICD-10-CM

## 2014-01-21 DIAGNOSIS — N939 Abnormal uterine and vaginal bleeding, unspecified: Secondary | ICD-10-CM

## 2014-01-21 DIAGNOSIS — F17200 Nicotine dependence, unspecified, uncomplicated: Secondary | ICD-10-CM | POA: Diagnosis not present

## 2014-01-21 DIAGNOSIS — N925 Other specified irregular menstruation: Secondary | ICD-10-CM | POA: Diagnosis not present

## 2014-01-21 DIAGNOSIS — I10 Essential (primary) hypertension: Secondary | ICD-10-CM | POA: Diagnosis not present

## 2014-01-21 DIAGNOSIS — Z825 Family history of asthma and other chronic lower respiratory diseases: Secondary | ICD-10-CM | POA: Diagnosis not present

## 2014-01-21 DIAGNOSIS — N8003 Adenomyosis of the uterus: Secondary | ICD-10-CM | POA: Diagnosis present

## 2014-01-21 DIAGNOSIS — Z8249 Family history of ischemic heart disease and other diseases of the circulatory system: Secondary | ICD-10-CM | POA: Diagnosis not present

## 2014-01-21 DIAGNOSIS — Z833 Family history of diabetes mellitus: Secondary | ICD-10-CM | POA: Diagnosis not present

## 2014-01-21 DIAGNOSIS — IMO0001 Reserved for inherently not codable concepts without codable children: Secondary | ICD-10-CM | POA: Diagnosis not present

## 2014-01-21 DIAGNOSIS — N949 Unspecified condition associated with female genital organs and menstrual cycle: Secondary | ICD-10-CM | POA: Diagnosis present

## 2014-01-21 DIAGNOSIS — N938 Other specified abnormal uterine and vaginal bleeding: Secondary | ICD-10-CM | POA: Diagnosis present

## 2014-01-21 HISTORY — DX: Intramural leiomyoma of uterus: D25.1

## 2014-01-21 HISTORY — DX: Abnormal uterine and vaginal bleeding, unspecified: N93.9

## 2014-01-21 HISTORY — DX: Endometriosis of uterus: N80.0

## 2014-01-21 HISTORY — DX: Pelvic and perineal pain: R10.2

## 2014-01-21 HISTORY — DX: Adenomyosis of the uterus: N80.03

## 2014-01-21 LAB — COMPREHENSIVE METABOLIC PANEL WITH GFR
ALT: 15 U/L (ref 0–35)
AST: 16 U/L (ref 0–37)
Albumin: 3.8 g/dL (ref 3.5–5.2)
Alkaline Phosphatase: 84 U/L (ref 39–117)
Anion gap: 11 (ref 5–15)
BUN: 13 mg/dL (ref 6–23)
CO2: 25 meq/L (ref 19–32)
Calcium: 9.2 mg/dL (ref 8.4–10.5)
Chloride: 105 meq/L (ref 96–112)
Creatinine, Ser: 0.69 mg/dL (ref 0.50–1.10)
GFR calc Af Amer: >90 mL/min
GFR calc non Af Amer: >90 mL/min
Glucose, Bld: 91 mg/dL (ref 70–99)
Potassium: 3.9 meq/L (ref 3.7–5.3)
Sodium: 141 meq/L (ref 137–147)
Total Bilirubin: <0.2 mg/dL — ABNORMAL LOW (ref 0.3–1.2)
Total Protein: 6.6 g/dL (ref 6.0–8.3)

## 2014-01-21 LAB — CBC
HCT: 35.1 % — ABNORMAL LOW (ref 36.0–46.0)
Hemoglobin: 11.5 g/dL — ABNORMAL LOW (ref 12.0–15.0)
MCH: 28.9 pg (ref 26.0–34.0)
MCHC: 32.8 g/dL (ref 30.0–36.0)
MCV: 88.2 fL (ref 78.0–100.0)
Platelets: 282 K/uL (ref 150–400)
RBC: 3.98 MIL/uL (ref 3.87–5.11)
RDW: 13.6 % (ref 11.5–15.5)
WBC: 9.7 K/uL (ref 4.0–10.5)

## 2014-01-21 NOTE — H&P (Signed)
[name] is an 38 y.o. female  [phone] with pelvic pain, AUB, likely adenomyosis, known small leiomyoma for LAVH.  D/W pt r/b/a, voices understanding, wishes to proceed.  Had benign EMB and [place] c/w small intramural fibroids and likely adenomyosis.  D/w pt surgery may not relieve pain  Pertinent Gynecological History:  Contraception: tubal ligation Sexually transmitted diseases: no past history   Last pap: normal Date: 03/31/11 - Pap WNL, HR HPV neg OB History: G4, P1213  SVD at 36wk x 2, LTCS transverse lie   Menstrual History:  No LMP recorded.    Past Medical History  Diagnosis Date  . Fibroids   . History of preterm labor   . History of recurrent UTI (urinary tract infection)   . Uterine fibroids affecting pregnancy   . Obese   . Polyhydramnios 09/20/2011  . S/P cesarean section 09/20/2011  . Encounter for female sterilization procedure 12/06/2011  . S/P tubal ligation 12/07/2011  . SVD (spontaneous vaginal delivery)     x 2  . Mild preeclampsia 09/20/2011    Hix with 2013 pregnancy  PIH - resolved, no longer an issue  . Depression     no meds  . Anxiety     no meds  . Fibromyalgia     no meds  . Intramural leiomyoma of uterus 01/21/2014  . Adenomyosis 01/21/2014  . Abnormal uterine bleeding (AUB) 01/21/2014  . Pelvic pain in female 01/21/2014    Past Surgical History  Procedure Laterality Date  . Knee surgery      right - dislocated knee cap  . Cesarean section  09/20/2011    Procedure: CESAREAN SECTION;  Surgeon: Thornell Sartorius, MD;  Location: [facility];  Service: Gynecology;  Laterality: N/A;  . Laparoscopic tubal ligation  12/07/2011    Procedure: LAPAROSCOPIC TUBAL LIGATION;  Surgeon: Thornell Sartorius, MD;  Location: [facility];  Service: Gynecology;  Laterality: Bilateral;  . Cholecystectomy      Lap    Family History  Problem Relation Age of Onset  . Cancer Mother     breast  . Hypertension Mother   . Heart disease Father   . Hypertension Father   . Asthma Sister   . Mental  illness Sister     bipolar, schizophrenia,   . Ovarian cysts Sister   . Seizures Sister   . Diabetes Maternal Grandmother   . Depression Maternal Grandfather     suicide    Social History:  reports that she has been smoking Cigarettes.  She has been smoking about 1.00 pack per day. She has never used smokeless tobacco. She reports that she drinks alcohol. She reports that she does not use illicit drugs.   Allergies:  Allergies  Allergen Reactions  . Hydrocodone-Acetaminophen Itching and Nausea And Vomiting    And dizziness    Percocet    Review of Systems  Constitutional: Negative.   HENT: Negative.   Eyes: Negative.   Respiratory: Negative.   Cardiovascular: Negative.   Gastrointestinal: Positive for abdominal pain.  Genitourinary: Negative.   Musculoskeletal: Negative.   Skin: Negative.   Neurological: Negative.   Psychiatric/Behavioral: Negative.     There were no vitals taken for this visit. Physical Exam  Constitutional: She is oriented to person, place, and time. She appears well-developed and well-nourished.  HENT:  Head: Normocephalic and atraumatic.  Cardiovascular: Normal rate and regular rhythm.   Respiratory: Effort normal and breath sounds normal. No respiratory distress. She has no wheezes.  GI: Soft.  Bowel sounds are normal. She exhibits no distension. There is no tenderness.  Musculoskeletal: Normal range of motion.  Neurological: She is alert and oriented to person, place, and time.  Skin: Skin is warm and dry.  Psychiatric: She has a normal mood and affect. Her behavior is normal.    Results for orders placed during the hospital encounter of 01/21/14 (from the past 24 hour(s))  COMPREHENSIVE METABOLIC PANEL     Status: Abnormal   Collection Time    01/21/14  2:49 PM      Result Value Ref Range   Sodium 141  137 - 147 mEq/L   Potassium 3.9  3.7 - 5.3 mEq/L   Chloride 105  96 - 112 mEq/L   CO2 25  19 - 32 mEq/L   Glucose, Bld 91  70 - 99 mg/dL    BUN 13  6 - 23 mg/dL   Creatinine, Ser 0.69  0.50 - 1.10 mg/dL   Calcium 9.2  8.4 - 10.5 mg/dL   Total Protein 6.6  6.0 - 8.3 g/dL   Albumin 3.8  3.5 - 5.2 g/dL   AST 16  0 - 37 U/L   ALT 15  0 - 35 U/L   Alkaline Phosphatase 84  39 - 117 U/L   Total Bilirubin <0.2 (*) 0.3 - 1.2 mg/dL   GFR calc non Af Amer >90  >90 mL/min   GFR calc Af Amer >90  >90 mL/min   Anion gap 11  5 - 15  CBC     Status: Abnormal   Collection Time    01/21/14  2:49 PM      Result Value Ref Range   WBC 9.7  4.0 - 10.5 K/uL   RBC 3.98  3.87 - 5.11 MIL/uL   Hemoglobin 11.5 (*) 12.0 - 15.0 g/dL   HCT 35.1 (*) 36.0 - 46.0 %   MCV 88.2  78.0 - 100.0 fL   MCH 28.9  26.0 - 34.0 pg   MCHC 32.8  30.0 - 36.0 g/dL   RDW 13.6  11.5 - 15.5 %   Platelets 282  150 - 400 K/uL   Urine Cx negative No results found.  Assessment/Plan: 17BL [id number] for LAVH given AUB, adenomyosis, fibroids, pelvic pain Prophylaxis with Ancef PCA post-op  Bovard-Stuckert, Chriss Mannan 01/21/2014, 8:26 PM

## 2014-01-21 NOTE — Patient Instructions (Addendum)
Your procedure is scheduled on:  Wednesday, January 22, 2014  Enter through the [employer] of [facility] at: 6:00 am  Pick up the phone at the desk and dial [phone].  Call this number if you have problems the morning of surgery: [phone].  Remember: Do NOT eat food:  After midnight tonight Do NOT drink clear liquids after: After midnight tonight Take these medicines the morning of surgery with a SIP OF WATER:  None  Do NOT wear jewelry (body piercing), metal hair clips/bobby pins, make-up, or nail polish. Do NOT wear lotions, powders, or perfumes.  You may wear deoderant. Do NOT shave for 48 hours prior to surgery. Do NOT bring valuables to the hospital. Contacts, dentures, or bridgework may not be worn into surgery. Leave suitcase in car.  After surgery it may be brought to your room.  For patients admitted to the hospital, checkout time is 11:00 AM the day of discharge.

## 2014-01-21 NOTE — Patient Instructions (Addendum)
   Your procedure is scheduled on:  Wednesday, August 5  Enter through the [employer] of [facility] at: 6 AM Pick up the phone at the desk and dial [phone] and inform us of your arrival.  Please call this number if you have any problems the morning of surgery: [phone]  Remember: Do not eat or drink after midnight: Tuesday Take these medicines the morning of surgery with a SIP OF WATER:  None  Do not wear jewelry, make-up, or FINGER nail polish No metal in your hair or on your body. Do not wear lotions, powders, perfumes.  You may wear deodorant.  Do not bring valuables to the hospital. Contacts, dentures or bridgework may not be worn into surgery.  Leave suitcase in the car. After Surgery it may be brought to your room. For patients being admitted to the hospital, checkout time is 11:00am the day of discharge.  Home with friend [name] cell [phone].

## 2014-01-22 ENCOUNTER — Encounter (HOSPITAL_COMMUNITY): Payer: Self-pay | Admitting: Anesthesiology

## 2014-01-22 ENCOUNTER — Observation Stay (HOSPITAL_COMMUNITY)
Admission: RE | Admit: 2014-01-22 | Discharge: 2014-01-23 | Disposition: A | Discharge status: Home / Self Care | Payer: Medicaid Other | Source: Ambulatory Visit | Attending: Obstetrics and Gynecology | Admitting: Obstetrics and Gynecology

## 2014-01-22 ENCOUNTER — Encounter (HOSPITAL_COMMUNITY): Payer: Medicaid Other | Admitting: Anesthesiology

## 2014-01-22 ENCOUNTER — Ambulatory Visit (HOSPITAL_COMMUNITY): Payer: Medicaid Other | Admitting: Anesthesiology

## 2014-01-22 ENCOUNTER — Encounter (HOSPITAL_COMMUNITY)
Admission: RE | Disposition: A | Discharge status: Home / Self Care | Payer: Self-pay | Source: Ambulatory Visit | Attending: Obstetrics and Gynecology

## 2014-01-22 DIAGNOSIS — I10 Essential (primary) hypertension: Secondary | ICD-10-CM | POA: Insufficient documentation

## 2014-01-22 DIAGNOSIS — D500 Iron deficiency anemia secondary to blood loss (chronic): Secondary | ICD-10-CM | POA: Insufficient documentation

## 2014-01-22 DIAGNOSIS — N8000 Endometriosis of the uterus, unspecified: Secondary | ICD-10-CM | POA: Insufficient documentation

## 2014-01-22 DIAGNOSIS — N939 Abnormal uterine and vaginal bleeding, unspecified: Secondary | ICD-10-CM | POA: Diagnosis present

## 2014-01-22 DIAGNOSIS — Z825 Family history of asthma and other chronic lower respiratory diseases: Secondary | ICD-10-CM | POA: Insufficient documentation

## 2014-01-22 DIAGNOSIS — N949 Unspecified condition associated with female genital organs and menstrual cycle: Secondary | ICD-10-CM | POA: Insufficient documentation

## 2014-01-22 DIAGNOSIS — N925 Other specified irregular menstruation: Secondary | ICD-10-CM | POA: Insufficient documentation

## 2014-01-22 DIAGNOSIS — N8003 Adenomyosis of the uterus: Secondary | ICD-10-CM | POA: Diagnosis present

## 2014-01-22 DIAGNOSIS — R102 Pelvic and perineal pain unspecified side: Secondary | ICD-10-CM | POA: Diagnosis present

## 2014-01-22 DIAGNOSIS — Z833 Family history of diabetes mellitus: Secondary | ICD-10-CM | POA: Insufficient documentation

## 2014-01-22 DIAGNOSIS — F341 Dysthymic disorder: Secondary | ICD-10-CM | POA: Insufficient documentation

## 2014-01-22 DIAGNOSIS — Z90710 Acquired absence of both cervix and uterus: Secondary | ICD-10-CM | POA: Diagnosis present

## 2014-01-22 DIAGNOSIS — N809 Endometriosis, unspecified: Secondary | ICD-10-CM

## 2014-01-22 DIAGNOSIS — D251 Intramural leiomyoma of uterus: Secondary | ICD-10-CM | POA: Diagnosis present

## 2014-01-22 DIAGNOSIS — Z818 Family history of other mental and behavioral disorders: Secondary | ICD-10-CM | POA: Insufficient documentation

## 2014-01-22 DIAGNOSIS — F17200 Nicotine dependence, unspecified, uncomplicated: Secondary | ICD-10-CM | POA: Insufficient documentation

## 2014-01-22 DIAGNOSIS — IMO0001 Reserved for inherently not codable concepts without codable children: Secondary | ICD-10-CM | POA: Insufficient documentation

## 2014-01-22 DIAGNOSIS — Z803 Family history of malignant neoplasm of breast: Secondary | ICD-10-CM | POA: Insufficient documentation

## 2014-01-22 DIAGNOSIS — N938 Other specified abnormal uterine and vaginal bleeding: Principal | ICD-10-CM | POA: Insufficient documentation

## 2014-01-22 DIAGNOSIS — Z8249 Family history of ischemic heart disease and other diseases of the circulatory system: Secondary | ICD-10-CM | POA: Insufficient documentation

## 2014-01-22 HISTORY — PX: LAPAROSCOPIC ASSISTED VAGINAL HYSTERECTOMY: SHX5398

## 2014-01-22 HISTORY — DX: Abnormal uterine and vaginal bleeding, unspecified: N93.9

## 2014-01-22 HISTORY — DX: Endometriosis, unspecified: N80.9

## 2014-01-22 HISTORY — DX: Intramural leiomyoma of uterus: D25.1

## 2014-01-22 HISTORY — DX: Pelvic and perineal pain: R10.2

## 2014-01-22 LAB — PREGNANCY, URINE: Preg Test, Ur: NEGATIVE

## 2014-01-22 SURGERY — HYSTERECTOMY, VAGINAL, LAPAROSCOPY-ASSISTED
Anesthesia: General | Site: Abdomen

## 2014-01-22 MED ORDER — CEFAZOLIN SODIUM-DEXTROSE 2-3 GM-% IV SOLR
INTRAVENOUS | Status: AC
  Filled 2014-01-22: qty 50

## 2014-01-22 MED ORDER — DEXAMETHASONE SODIUM PHOSPHATE 10 MG/ML IJ SOLN
INTRAMUSCULAR | Status: DC | PRN
  Administered 2014-01-22: 4 mg via INTRAVENOUS

## 2014-01-22 MED ORDER — VASOPRESSIN 20 UNIT/ML IJ SOLN
INTRAMUSCULAR | Status: AC
  Filled 2014-01-22: qty 1

## 2014-01-22 MED ORDER — SODIUM CHLORIDE 0.9 % IJ SOLN
INTRAMUSCULAR | Status: AC
  Filled 2014-01-22: qty 100

## 2014-01-22 MED ORDER — ALUM & MAG HYDROXIDE-SIMETH 200-200-20 MG/5ML PO SUSP: 30.0000 mL | ORAL | Status: DC | PRN

## 2014-01-22 MED ORDER — BUPIVACAINE HCL (PF) 0.25 % IJ SOLN
INTRAMUSCULAR | Status: DC | PRN
  Administered 2014-01-22: 4 mL

## 2014-01-22 MED ORDER — PROPOFOL 10 MG/ML IV EMUL
INTRAVENOUS | Status: AC
  Filled 2014-01-22: qty 20

## 2014-01-22 MED ORDER — ROCURONIUM BROMIDE 100 MG/10ML IV SOLN
INTRAVENOUS | Status: DC | PRN
  Administered 2014-01-22: 50 mg via INTRAVENOUS
  Administered 2014-01-22: 20 mg via INTRAVENOUS

## 2014-01-22 MED ORDER — LIDOCAINE HCL (CARDIAC) 20 MG/ML IV SOLN
INTRAVENOUS | Status: DC | PRN
  Administered 2014-01-22: 40 mg via INTRAVENOUS

## 2014-01-22 MED ORDER — ONDANSETRON HCL 4 MG/2ML IJ SOLN: 4.0000 mg | Freq: Four times a day (QID) | INTRAMUSCULAR | Status: DC | PRN

## 2014-01-22 MED ORDER — LACTATED RINGERS IV SOLN
INTRAVENOUS | Status: DC
  Administered 2014-01-22: 1000 mL via INTRAVENOUS

## 2014-01-22 MED ORDER — FENTANYL CITRATE 0.05 MG/ML IJ SOLN
25.0000 ug | INTRAMUSCULAR | Status: DC | PRN
  Administered 2014-01-22 (×5): 50 ug via INTRAVENOUS

## 2014-01-22 MED ORDER — KETOROLAC TROMETHAMINE 30 MG/ML IJ SOLN: 15.0000 mg | Freq: Once | INTRAMUSCULAR | Status: DC | PRN

## 2014-01-22 MED ORDER — BUPIVACAINE HCL (PF) 0.25 % IJ SOLN
INTRAMUSCULAR | Status: AC
  Filled 2014-01-22: qty 30

## 2014-01-22 MED ORDER — LIDOCAINE HCL (CARDIAC) 20 MG/ML IV SOLN
INTRAVENOUS | Status: AC
  Filled 2014-01-22: qty 5

## 2014-01-22 MED ORDER — MIDAZOLAM HCL 2 MG/2ML IJ SOLN
INTRAMUSCULAR | Status: AC
  Filled 2014-01-22: qty 2

## 2014-01-22 MED ORDER — ONDANSETRON HCL 4 MG/2ML IJ SOLN
INTRAMUSCULAR | Status: AC
  Filled 2014-01-22: qty 2

## 2014-01-22 MED ORDER — GLYCOPYRROLATE 0.2 MG/ML IJ SOLN
INTRAMUSCULAR | Status: DC | PRN
  Administered 2014-01-22: .5 mg via INTRAVENOUS

## 2014-01-22 MED ORDER — MENTHOL 3 MG MT LOZG: 1.0000 | LOZENGE | OROMUCOSAL | Status: DC | PRN

## 2014-01-22 MED ORDER — ACETAMINOPHEN 10 MG/ML IV SOLN
1000.0000 mg | Freq: Four times a day (QID) | INTRAVENOUS | Status: DC
  Administered 2014-01-22 (×2): 1000 mg via INTRAVENOUS
  Filled 2014-01-22 (×4): qty 100

## 2014-01-22 MED ORDER — DIPHENHYDRAMINE HCL 12.5 MG/5ML PO ELIX
12.5000 mg | ORAL_SOLUTION | Freq: Four times a day (QID) | ORAL | Status: DC | PRN
  Administered 2014-01-23: 12.5 mg via ORAL
  Filled 2014-01-22: qty 5

## 2014-01-22 MED ORDER — FENTANYL CITRATE 0.05 MG/ML IJ SOLN
INTRAMUSCULAR | Status: AC
  Filled 2014-01-22: qty 2

## 2014-01-22 MED ORDER — GUAIFENESIN 100 MG/5ML PO SOLN: 15.0000 mL | ORAL | Status: DC | PRN

## 2014-01-22 MED ORDER — FENTANYL CITRATE 0.05 MG/ML IJ SOLN
INTRAMUSCULAR | Status: AC
  Filled 2014-01-22: qty 5

## 2014-01-22 MED ORDER — PNEUMOCOCCAL VAC POLYVALENT 25 MCG/0.5ML IJ INJ
0.5000 mL | INJECTION | INTRAMUSCULAR | Status: AC
Start: 2014-01-23 — End: 2014-01-23
  Administered 2014-01-23: 0.5 mL via INTRAMUSCULAR
  Filled 2014-01-22: qty 0.5

## 2014-01-22 MED ORDER — DEXAMETHASONE SODIUM PHOSPHATE 10 MG/ML IJ SOLN
INTRAMUSCULAR | Status: AC
  Filled 2014-01-22: qty 1

## 2014-01-22 MED ORDER — NEOSTIGMINE METHYLSULFATE 10 MG/10ML IV SOLN
INTRAVENOUS | Status: DC | PRN
  Administered 2014-01-22: 2.5 mg via INTRAVENOUS

## 2014-01-22 MED ORDER — GLYCOPYRROLATE 0.2 MG/ML IJ SOLN
INTRAMUSCULAR | Status: AC
  Filled 2014-01-22: qty 4

## 2014-01-22 MED ORDER — SCOPOLAMINE 1 MG/3DAYS TD PT72
1.0000 | MEDICATED_PATCH | Freq: Once | TRANSDERMAL | Status: DC
  Administered 2014-01-22: 1.5 mg via TRANSDERMAL

## 2014-01-22 MED ORDER — PROMETHAZINE HCL 25 MG/ML IJ SOLN: 6.2500 mg | INTRAMUSCULAR | Status: DC | PRN

## 2014-01-22 MED ORDER — LACTATED RINGERS IV SOLN
INTRAVENOUS | Status: DC
  Administered 2014-01-22 – 2014-01-23 (×3): via INTRAVENOUS

## 2014-01-22 MED ORDER — DIPHENHYDRAMINE HCL 50 MG/ML IJ SOLN: 12.5000 mg | Freq: Four times a day (QID) | INTRAMUSCULAR | Status: DC | PRN

## 2014-01-22 MED ORDER — HYDROMORPHONE 0.3 MG/ML IV SOLN
INTRAVENOUS | Status: DC
  Administered 2014-01-22: 1.79 mg via INTRAVENOUS
  Administered 2014-01-22 (×2): 1.19 mg via INTRAVENOUS
  Administered 2014-01-22: 12:00:00 via INTRAVENOUS
  Administered 2014-01-23: 2.59 mg via INTRAVENOUS
  Filled 2014-01-22: qty 25

## 2014-01-22 MED ORDER — FENTANYL CITRATE 0.05 MG/ML IJ SOLN
INTRAMUSCULAR | Status: DC | PRN
  Administered 2014-01-22 (×3): 100 ug via INTRAVENOUS
  Administered 2014-01-22: 50 ug via INTRAVENOUS
  Administered 2014-01-22: 100 ug via INTRAVENOUS
  Administered 2014-01-22: 50 ug via INTRAVENOUS

## 2014-01-22 MED ORDER — LACTATED RINGERS IV SOLN
INTRAVENOUS | Status: DC
  Administered 2014-01-22 (×3): via INTRAVENOUS

## 2014-01-22 MED ORDER — KETOROLAC TROMETHAMINE 30 MG/ML IJ SOLN
INTRAMUSCULAR | Status: DC | PRN
  Administered 2014-01-22: 30 mg via INTRAVENOUS

## 2014-01-22 MED ORDER — LACTATED RINGERS IV BOLUS (SEPSIS)
1000.0000 mL | Freq: Once | INTRAVENOUS | Status: AC
  Administered 2014-01-22: 1000 mL via INTRAVENOUS

## 2014-01-22 MED ORDER — NALOXONE HCL 0.4 MG/ML IJ SOLN: 0.4000 mg | INTRAMUSCULAR | Status: DC | PRN

## 2014-01-22 MED ORDER — SCOPOLAMINE 1 MG/3DAYS TD PT72
MEDICATED_PATCH | TRANSDERMAL | Status: AC
  Filled 2014-01-22: qty 1

## 2014-01-22 MED ORDER — SODIUM CHLORIDE 0.9 % IJ SOLN: 9.0000 mL | INTRAMUSCULAR | Status: DC | PRN

## 2014-01-22 MED ORDER — SIMETHICONE 80 MG PO CHEW: 80.0000 mg | CHEWABLE_TABLET | Freq: Four times a day (QID) | ORAL | Status: DC | PRN

## 2014-01-22 MED ORDER — METHYLENE BLUE 1 % INJ SOLN
INTRAMUSCULAR | Status: AC
  Filled 2014-01-22: qty 1

## 2014-01-22 MED ORDER — NEOSTIGMINE METHYLSULFATE 10 MG/10ML IV SOLN
INTRAVENOUS | Status: AC
  Filled 2014-01-22: qty 1

## 2014-01-22 MED ORDER — MIDAZOLAM HCL 5 MG/5ML IJ SOLN
INTRAMUSCULAR | Status: DC | PRN
  Administered 2014-01-22: 2 mg via INTRAVENOUS

## 2014-01-22 MED ORDER — KETOROLAC TROMETHAMINE 30 MG/ML IJ SOLN
INTRAMUSCULAR | Status: AC
  Filled 2014-01-22: qty 1

## 2014-01-22 MED ORDER — MEPERIDINE HCL 25 MG/ML IJ SOLN: 6.2500 mg | INTRAMUSCULAR | Status: DC | PRN

## 2014-01-22 MED ORDER — ONDANSETRON HCL 4 MG PO TABS: 4.0000 mg | ORAL_TABLET | Freq: Four times a day (QID) | ORAL | Status: DC | PRN

## 2014-01-22 MED ORDER — PROPOFOL 10 MG/ML IV BOLUS
INTRAVENOUS | Status: DC | PRN
  Administered 2014-01-22: 150 mg via INTRAVENOUS

## 2014-01-22 MED ORDER — HEPARIN SODIUM (PORCINE) 5000 UNIT/ML IJ SOLN
INTRAMUSCULAR | Status: AC
  Filled 2014-01-22: qty 1

## 2014-01-22 MED ORDER — OXYCODONE-ACETAMINOPHEN 5-325 MG PO TABS: 1.0000 | ORAL_TABLET | ORAL | Status: DC | PRN

## 2014-01-22 MED ORDER — IBUPROFEN 800 MG PO TABS
800.0000 mg | ORAL_TABLET | Freq: Three times a day (TID) | ORAL | Status: DC | PRN
  Administered 2014-01-23: 800 mg via ORAL
  Filled 2014-01-22: qty 1

## 2014-01-22 MED ORDER — ACETAMINOPHEN 500 MG PO TABS
1000.0000 mg | ORAL_TABLET | Freq: Four times a day (QID) | ORAL | Status: AC
  Administered 2014-01-22 – 2014-01-23 (×2): 1000 mg via ORAL
  Filled 2014-01-22 (×2): qty 2

## 2014-01-22 MED ORDER — ONDANSETRON HCL 4 MG/2ML IJ SOLN
INTRAMUSCULAR | Status: DC | PRN
  Administered 2014-01-22: 4 mg via INTRAVENOUS

## 2014-01-22 MED ORDER — CEFAZOLIN SODIUM-DEXTROSE 2-3 GM-% IV SOLR
2.0000 g | INTRAVENOUS | Status: AC
  Administered 2014-01-22: 2 g via INTRAVENOUS

## 2014-01-22 SURGICAL SUPPLY — 40 items
BLADE SURG 10 STRL SS (BLADE) ×3 IMPLANT
BLADE SURG 11 STRL SS (BLADE) ×6 IMPLANT
CABLE HIGH FREQUENCY MONO STRZ (ELECTRODE) IMPLANT
CLOSURE WOUND 1/4 X3 (GAUZE/BANDAGES/DRESSINGS)
CLOTH BEACON ORANGE TIMEOUT ST (SAFETY) ×3 IMPLANT
CONT PATH 16OZ SNAP LID 3702 (MISCELLANEOUS) ×3 IMPLANT
COVER TABLE BACK 60X90 (DRAPES) ×3 IMPLANT
DECANTER SPIKE VIAL GLASS SM (MISCELLANEOUS) ×3 IMPLANT
DRSG COVADERM PLUS 2X2 (GAUZE/BANDAGES/DRESSINGS) ×6 IMPLANT
DRSG OPSITE POSTOP 3X4 (GAUZE/BANDAGES/DRESSINGS) ×3 IMPLANT
DURAPREP 26ML APPLICATOR (WOUND CARE) ×3 IMPLANT
ELECT REM PT RETURN 9FT ADLT (ELECTROSURGICAL) ×3
ELECTRODE REM PT RTRN 9FT ADLT (ELECTROSURGICAL) ×1 IMPLANT
EVACUATOR PREFILTER SMOKE (MISCELLANEOUS) ×3 IMPLANT
GLOVE BIO SURGEON STRL SZ 6.5 (GLOVE) ×2 IMPLANT
GLOVE BIO SURGEON STRL SZ7 (GLOVE) ×3 IMPLANT
GLOVE BIO SURGEONS STRL SZ 6.5 (GLOVE) ×1
GLOVE BIOGEL PI IND STRL 7.0 (GLOVE) ×2 IMPLANT
GLOVE BIOGEL PI INDICATOR 7.0 (GLOVE) ×4
GOWN STRL REUS W/ TWL LRG LVL3 (GOWN DISPOSABLE) ×7 IMPLANT
GOWN STRL REUS W/TWL LRG LVL3 (GOWN DISPOSABLE) ×14
NEEDLE INSUFFLATION 120MM (ENDOMECHANICALS) ×3 IMPLANT
NS IRRIG 1000ML POUR BTL (IV SOLUTION) ×3 IMPLANT
PACK LAVH (CUSTOM PROCEDURE TRAY) ×3 IMPLANT
PROTECTOR NERVE ULNAR (MISCELLANEOUS) ×3 IMPLANT
SET CYSTO W/LG BORE CLAMP LF (SET/KITS/TRAYS/PACK) IMPLANT
SET IRRIG TUBING LAPAROSCOPIC (IRRIGATION / IRRIGATOR) ×3 IMPLANT
SHEARS HARMONIC ACE PLUS 36CM (ENDOMECHANICALS) ×3 IMPLANT
STRIP CLOSURE SKIN 1/4X3 (GAUZE/BANDAGES/DRESSINGS) IMPLANT
SUT VIC AB 1 CT1 18XBRD ANBCTR (SUTURE) ×2 IMPLANT
SUT VIC AB 1 CT1 8-18 (SUTURE) ×4
SUT VIC AB 2-0 CT1 (SUTURE) ×6 IMPLANT
SUT VICRYL 0 TIES 12 18 (SUTURE) ×3 IMPLANT
SUT VICRYL 0 UR6 27IN ABS (SUTURE) IMPLANT
SUT VICRYL 4-0 PS2 18IN ABS (SUTURE) ×6 IMPLANT
TOWEL OR 17X24 6PK STRL BLUE (TOWEL DISPOSABLE) ×6 IMPLANT
TRAY FOLEY CATH 14FR (SET/KITS/TRAYS/PACK) ×3 IMPLANT
TROCAR XCEL NON-BLD 5MMX100MML (ENDOMECHANICALS) ×9 IMPLANT
WARMER LAPAROSCOPE (MISCELLANEOUS) ×3 IMPLANT
WATER STERILE IRR 1000ML POUR (IV SOLUTION) ×3 IMPLANT

## 2014-01-22 NOTE — Anesthesia Preprocedure Evaluation (Signed)
Anesthesia Evaluation  Patient identified by MRN, date of birth, ID band Patient awake    Reviewed: Allergy & Precautions, H&P , NPO status , Patient's Chart, lab work & pertinent test results, reviewed documented beta blocker date and time   History of Anesthesia Complications Negative for: history of anesthetic complications  Airway Mallampati: II TM Distance: >3 FB Neck ROM: full    Dental  (+) Teeth Intact   Pulmonary Current Smoker,  breath sounds clear to auscultation  Pulmonary exam normal       Cardiovascular Exercise Tolerance: Good hypertension, Rhythm:regular Rate:Normal     Neuro/Psych PSYCHIATRIC DISORDERS (depression) Anxiety Depression    GI/Hepatic negative GI ROS, Neg liver ROS,   Endo/Other  negative endocrine ROS  Renal/GU negative Renal ROS  negative genitourinary   Musculoskeletal  (+) Fibromyalgia -, narcotic dependent  Abdominal   Peds  Hematology negative hematology ROS (+)   Anesthesia Other Findings   Reproductive/Obstetrics negative OB ROS                           Anesthesia Physical  Anesthesia Plan  ASA: II  Anesthesia Plan: General   Post-op Pain Management:    Induction: Intravenous  Airway Management Planned: Oral ETT  Additional Equipment:   Intra-op Plan:   Post-operative Plan: Extubation in OR  Informed Consent: I have reviewed the patients History and Physical, chart, labs and discussed the procedure including the risks, benefits and alternatives for the proposed anesthesia with the patient or authorized representative who has indicated his/her understanding and acceptance.   Dental advisory given  Plan Discussed with: CRNA  Anesthesia Plan Comments:         Anesthesia Quick Evaluation

## 2014-01-22 NOTE — Brief Op Note (Signed)
01/22/2014  9:59 AM  PATIENT:  [name]  38 y.o. female  PRE-OPERATIVE DIAGNOSIS:  Menorrhagia, Adenomyosis,  POST-OPERATIVE DIAGNOSIS:  Menorrhagia, Adenomyosis,  PROCEDURE:  Procedure(s): LAPAROSCOPIC ASSISTED VAGINAL HYSTERECTOMY, Bilateral Salpingectomy (N/A)  SURGEON:  Surgeon(s) and Role:    * Janyth Contes, MD - Primary    * Cheri Fowler, MD - Assisting  ANESTHESIA:   local and general  EBL:  Total I/O In: 2000 [I.V.:2000] Out: 1300 [Urine:500; Blood:800]  BLOOD ADMINISTERED:none  DRAINS: Urinary Catheter (Foley)   LOCAL MEDICATIONS USED:  MARCAINE     SPECIMEN:  Source of Specimen:  Cervix, uterus, B tubes  DISPOSITION OF SPECIMEN:  PATHOLOGY  COUNTS:  YES  TOURNIQUET:  * No tourniquets in log *  DICTATION: .Other Dictation: Dictation Number [id number]  PLAN OF CARE: Admit for overnight observation  PATIENT DISPOSITION:  PACU - hemodynamically stable.   Delay start of Pharmacological VTE agent (>24hrs) due to surgical blood loss or risk of bleeding: not applicable

## 2014-01-22 NOTE — Interval H&P Note (Signed)
History and Physical Interval Note:  01/22/2014 7:02 AM  [name]  has presented today for surgery, with the diagnosis of Menorrhagia, Adenomyosis,  The various methods of treatment have been discussed with the patient and family. After consideration of risks, benefits and other options for treatment, the patient has consented to  Procedure(s): LAPAROSCOPIC ASSISTED VAGINAL HYSTERECTOMY (N/A) as a surgical intervention .  The patient's history has been reviewed, patient examined, no change in status, stable for surgery.  I have reviewed the patient's chart and labs.  Questions were answered to the patient's satisfaction.  In preop pt requests addition of B salpingectomy, added to consent.     Bovard-Stuckert, Briella Hobday

## 2014-01-22 NOTE — Anesthesia Postprocedure Evaluation (Signed)
  Anesthesia Post-op Note  Patient: [name]  Procedure(s) Performed: Procedure(s): LAPAROSCOPIC ASSISTED VAGINAL HYSTERECTOMY, Bilateral Salpingectomy (N/A)  Patient Location: [facility]  Anesthesia Type:General  Level of Consciousness: awake, alert , oriented and patient cooperative  Airway and Oxygen Therapy: Patient Spontanous Breathing and Patient connected to nasal cannula oxygen  Post-op Pain: mild  Post-op Assessment: Post-op Vital signs reviewed, Patient's Cardiovascular Status Stable, Respiratory Function Stable, Patent Airway, No signs of Nausea or vomiting, Adequate PO intake and Pain level controlled  Post-op Vital Signs: Reviewed and stable  Last Vitals:  Filed Vitals:   01/22/14 1459  BP: 113/72  Pulse: 107  Temp: 37 C  Resp: 11    Complications: No apparent anesthesia complications

## 2014-01-22 NOTE — Addendum Note (Signed)
Addendum created 01/22/14 1615 by Brock Ra, CRNA   Modules edited: Notes Section   Notes Section:  File: [id number]

## 2014-01-22 NOTE — Progress Notes (Signed)
Day of Surgery Procedure(s) (LRB): LAPAROSCOPIC ASSISTED VAGINAL HYSTERECTOMY, Bilateral Salpingectomy (N/A)  Subjective: Patient reports feeling like she has to pee.  She appears sleepy but responsive.  No nausea, wants to eat. Pain controlled   Objective: I have reviewed patient's vital signs and intake and output.  Appears sleepy, but rousable in conversation Abdomen soft NT  Assessment: s/p Procedure(s): LAPAROSCOPIC ASSISTED VAGINAL HYSTERECTOMY, Bilateral Salpingectomy (N/A):  Plan: D/Page pt could remove foley catheter, but may have to be replaced if not able to void, she would have to have replaced--she agrees to just leave in UOP a little sluggish at first, but responding well to a bolus Regular diet as tolerated  LOS: 0 days    [name] 01/22/2014, 6:17 PM

## 2014-01-22 NOTE — Transfer of Care (Signed)
Immediate Anesthesia Transfer of Care Note  Patient: [name]  Procedure(s) Performed: Procedure(s): LAPAROSCOPIC ASSISTED VAGINAL HYSTERECTOMY, Bilateral Salpingectomy (N/A)  Patient Location: PACU  Anesthesia Type:General  Level of Consciousness: awake  Airway & Oxygen Therapy: Patient Spontanous Breathing and Patient connected to nasal cannula oxygen  Post-op Assessment: Report given to PACU RN and Post -op Vital signs reviewed and stable  Post vital signs: stable  Complications: No apparent anesthesia complications

## 2014-01-22 NOTE — Anesthesia Postprocedure Evaluation (Signed)
  Anesthesia Post Note  Patient: [name]  Procedure(s) Performed: Procedure(s) (LRB): LAPAROSCOPIC ASSISTED VAGINAL HYSTERECTOMY, Bilateral Salpingectomy (N/A)  Anesthesia type: GA  Patient location: PACU  Post pain: Pain level controlled  Post assessment: Post-op Vital signs reviewed  Last Vitals:  Filed Vitals:   01/22/14 1100  BP: 129/111  Pulse:   Temp:   Resp:     Post vital signs: Reviewed  Level of consciousness: sedated  Complications: No apparent anesthesia complications

## 2014-01-23 ENCOUNTER — Encounter (HOSPITAL_COMMUNITY): Payer: Self-pay | Admitting: Obstetrics and Gynecology

## 2014-01-23 DIAGNOSIS — N938 Other specified abnormal uterine and vaginal bleeding: Secondary | ICD-10-CM | POA: Diagnosis not present

## 2014-01-23 DIAGNOSIS — N949 Unspecified condition associated with female genital organs and menstrual cycle: Secondary | ICD-10-CM | POA: Diagnosis not present

## 2014-01-23 LAB — BASIC METABOLIC PANEL
Anion gap: 9 (ref 5–15)
BUN: 8 mg/dL (ref 6–23)
CO2: 26 mEq/L (ref 19–32)
Calcium: 8.3 mg/dL — ABNORMAL LOW (ref 8.4–10.5)
Chloride: 104 mEq/L (ref 96–112)
Creatinine, Ser: 0.58 mg/dL (ref 0.50–1.10)
GFR calc Af Amer: >90 mL/min (ref >90)
GLUCOSE: 99 mg/dL (ref 70–99)
Potassium: 4 mEq/L (ref 3.7–5.3)
Sodium: 139 mEq/L (ref 137–147)

## 2014-01-23 LAB — CBC
HEMATOCRIT: 15.9 % — AB (ref 36.0–46.0)
Hemoglobin: 5.2 g/dL — CL (ref 12.0–15.0)
MCH: 28.6 pg (ref 26.0–34.0)
MCHC: 32.7 g/dL (ref 30.0–36.0)
MCV: 87.4 fL (ref 78.0–100.0)
Platelets: 205 10*3/uL (ref 150–400)
RBC: 1.82 MIL/uL — AB (ref 3.87–5.11)
RDW: 14 % (ref 11.5–15.5)
WBC: 14 10*3/uL — AB (ref 4.0–10.5)

## 2014-01-23 MED ORDER — OXYCODONE-ACETAMINOPHEN 5-325 MG PO TABS: 1.0000 | ORAL_TABLET | Freq: Four times a day (QID) | ORAL | Status: DC | PRN

## 2014-01-23 MED ORDER — OXYCODONE HCL 5 MG PO TABS
5.0000 mg | ORAL_TABLET | ORAL | Status: DC | PRN
  Administered 2014-01-23: 10 mg via ORAL
  Filled 2014-01-23: qty 2

## 2014-01-23 MED ORDER — FERROUS SULFATE 325 (65 FE) MG PO TABS: 325.0000 mg | ORAL_TABLET | Freq: Two times a day (BID) | ORAL | Status: DC

## 2014-01-23 MED ORDER — IBUPROFEN 800 MG PO TABS: 800.0000 mg | ORAL_TABLET | Freq: Three times a day (TID) | ORAL | Status: DC | PRN

## 2014-01-23 NOTE — Progress Notes (Signed)
1 Day Post-Op Procedure(s) (LRB): LAPAROSCOPIC ASSISTED VAGINAL HYSTERECTOMY, Bilateral Salpingectomy (N/A)  Subjective: Patient reports incisional pain and tolerating PO.  Pain controlled.  Tolerating blood loss anemia  Objective: I have reviewed patient's vital signs, intake and output and labs; decreased Hgb  General: alert and no distress Resp: clear to auscultation bilaterally Cardio: regular rate and rhythm GI: soft, non-tender; bowel sounds normal; no masses,  no organomegaly and incision: clean, dry and intact Extremities: extremities normal, atraumatic, no cyanosis or edema Vaginal Bleeding: minimal  Assessment: s/p Procedure(s): LAPAROSCOPIC ASSISTED VAGINAL HYSTERECTOMY, Bilateral Salpingectomy (N/A): stable and progressing well  Plan: Advance diet Encourage ambulation Advance to PO medication Discontinue IV fluids Discharge home when meeting criteria   LOS: 1 day    Bovard-Stuckert, Vaishnav Demartin 01/23/2014, 6:37 AM

## 2014-01-23 NOTE — Progress Notes (Signed)
Pt is discharged in the care of husband. Downstairs per wheelchair with N.T. Escort. Denies any further pain or discomfort.Spirits are good. Abdominal dressing is clean and dry Discharge instructions with Rx were given to pt with good understanding.  Questions asked and answered.

## 2014-01-23 NOTE — Op Note (Signed)
NAME:  [name], [name] NO.:  [id number]  MEDICAL RECORD NO.:  [id number]  LOCATION:  1791                          FACILITY:  [facility]  PHYSICIAN:  Thornell Sartorius, MD        DATE OF BIRTH:  July 31, 1975  DATE OF PROCEDURE:  01/22/2014 DATE OF DISCHARGE:                              OPERATIVE REPORT   PREOPERATIVE DIAGNOSES:  Fibroids, abnormal uterine bleeding, dysfunctional uterine bleeding, adenomyosis.  POSTOPERATIVE DIAGNOSES:  Fibroids, abnormal uterine bleeding, dysfunctional uterine bleeding, adenomyosis.  PROCEDURES:  Laparoscopic-assisted vaginal hysterectomy and bilateral salpingectomy.  SURGEON:  Thornell Sartorius, M.D.  ASSISTANT:  Clarene Duke, M.D.  ANESTHESIA:  Local and general anesthesia.  ESTIMATED BLOOD LOSS:  Approximately 800 mL.  URINE OUTPUT:  500 mL clear urine at the end of the procedure.  IV FLUIDS:  2000 mL.  SPECIMEN:  Cervix, uterus, and bilateral tubes to Pathology.  COMPLICATIONS:  None.  PROCEDURE IN DETAIL:  After the case was discussed with the patient and her partner including risks, benefits, and alternatives, she was transported to the operating room, placed on the table in supine position.  General anesthesia was induced and found to be adequate.  She was then prepped and draped in the normal sterile fashion.  Foley catheter was sterilely placed.  Using an open-sided speculum and a single-tooth tenaculum, a Hulka manipulator was placed in her cervix. Gloves were changed and gown, and attention was turned to the abdominal portion of the case.  An approximately 5-mm infraumbilical incision was made.  Using a Veress, the pneumoperitoneum was obtained after passing the hanging drop test.  Accessory ports were placed in the right and left after carefully visualizing the epigastric vessels.  The uterus was visualized and noted to be boggy.  Tubes and ovaries were seen.  She had previously had a tubal ligation, so the ends of  the tubes were removed. The uterus was grasped with a single-tooth tenaculum, and using a Harmonic scalpel, the cardinal ligaments and round ligaments were incised to the level of the uterine artery.  Bladder flap was created bilaterally.  There was some bleeding that was easily controlled with Harmonic scalpel.  Attention was turned to the vaginal portion of the case.  Using a heavy weighted speculum, the cervix was circumscribed with Bovie cautery, and an attempt was made to enter anteriorly, this was unsuccessful.  The posterior cul-de-sac was entered.  A banano speculum was placed.  The uterosacral ligaments were ligated and held in a stepwise fashion to the level of the uterine artery.  The cervix was incised to that level.  The uterus was then delivered.  Bleeding was controlled with Bovie cautery and several interrupted sutures of 3-0 Vicryl.  The posterior cuff was bleeding in several places, the cuff bleeding was controlled with running locked stitch.  The uterosacral ligaments were plicated and then the held sutures were also tied together.  The bleeding had been mostly controlled, clot had been removed.  The cuff was closed with 2-0 Vicryl in a running locked fashion.  Gloves and gowns were changed. Attention was turned back to the abdominal portion of the case. Bleeding was  noted to be controlled.  There was no active bleeding. Ureter was visualized on the patient's left.  Secondary to anatomy, it was difficult to visualize on the right.  She had good urine output.  A BMP will be checked in the morning.  A small clot was left in the pelvis.  The gas was evacuated.  The trocars were removed under direct visualization.  The skin was closed with 4-0 Vicryl and Dermabond. Sponge, lap, and needle counts were correct x2.  The patient tolerated the procedure well.     Thornell Sartorius, MD     JB/MEDQ  D:  01/22/2014  T:  01/22/2014  Job:  [id number]

## 2014-01-23 NOTE — Discharge Summary (Signed)
Physician Discharge Summary  Patient ID: [name] MRN: [id number] DOB/AGE: 1976-04-22 38 y.o.  Admit date: 01/22/2014 Discharge date: 01/23/2014  Admission Diagnoses: AUB, DUB, adenomyosis, pelvic pain  Discharge Diagnoses:  Principal Problem:   Abnormal uterine bleeding (AUB) Active Problems:   Intramural leiomyoma of uterus   Adenomyosis   Pelvic pain in female   S/P laparoscopic assisted vaginal hysterectomy (LAVH)   Discharged Condition: good  Hospital Course: Admitted 01/22/14 for LAVH, B salpingectomy, went well, some increased blood loss.  Otherwise uncomplicated surgery.  POD #1 ambulating, voiding, pain controlled.  Hgb decreased from 11-5.2 - feels well, declines transfusion.  Cr stable.    Consults: None  Significant Diagnostic Studies: labs: CBC, CMP  Treatments: IV hydration, analgesia: Dilaudid PCA and Po meds and surgery: LAVH/B salpingectomy  Discharge Exam: Blood pressure 144/71, pulse 96, temperature 98.2 F (36.8 C), temperature source Oral, resp. rate 17, height 5\' 8"  (1.727 m), weight 90.266 kg (199 lb), SpO2 96.00%. General appearance: alert and no distress Resp: clear to auscultation bilaterally Cardio: regular rate and rhythm GI: soft, non-tender; bowel sounds normal; no masses,  no organomegaly Incision/Wound:C/D/I  Disposition: 01-Home or Self Care  Discharge Instructions   Call MD for:  persistant nausea and vomiting    Complete by:  As directed      Call MD for:  redness, tenderness, or signs of infection (pain, swelling, redness, odor or green/yellow discharge around incision site)    Complete by:  As directed      Call MD for:  severe uncontrolled pain    Complete by:  As directed      Diet - low sodium heart healthy    Complete by:  As directed      Discharge instructions    Complete by:  As directed   Call [phone] with questions or problems     Driving Restrictions    Complete by:  As directed   While taking strong pain medicines      Increase activity slowly    Complete by:  As directed      Lifting restrictions    Complete by:  As directed   No greater 10-15lbs     May shower / Bathe    Complete by:  As directed      May walk up steps    Complete by:  As directed      Sexual Activity Restrictions    Complete by:  As directed   Pelvic rest - no douching, tampons or sex for 6 weeks            Medication List         BC HEADACHE POWDER PO  Take 1 Package by mouth once.     ibuprofen 800 MG tablet  Commonly known as:  ADVIL,MOTRIN  Take 1 tablet (800 mg total) by mouth every 8 (eight) hours as needed (mild pain).     oxyCODONE-acetaminophen 5-325 MG per tablet  Commonly known as:  PERCOCET/ROXICET  Take 1-2 tablets by mouth every 6 (six) hours as needed for severe pain.      Also iron      Follow-up Information   Follow up with Bovard-Stuckert, Jeral Fruit, MD In 2 weeks. (for post-op incision check, 6 wks full postop visit)    Specialty:  Obstetrics and Gynecology   Contact information:   [address] [place] [address] [phone]       Signed: Janyth Contes 01/23/2014, 9:03 AM

## 2014-03-11 ENCOUNTER — Ambulatory Visit: Payer: Medicaid Other | Admitting: Family Medicine

## 2014-04-21 ENCOUNTER — Encounter (HOSPITAL_COMMUNITY): Payer: Self-pay | Admitting: Obstetrics and Gynecology

## 2015-04-25 ENCOUNTER — Emergency Department (HOSPITAL_COMMUNITY): Payer: Medicaid Other

## 2015-04-25 ENCOUNTER — Emergency Department (HOSPITAL_COMMUNITY)
Admission: EM | Admit: 2015-04-25 | Discharge: 2015-04-25 | Disposition: A | Discharge status: Home / Self Care | Payer: Medicaid Other | Attending: Emergency Medicine | Admitting: Emergency Medicine

## 2015-04-25 ENCOUNTER — Encounter (HOSPITAL_COMMUNITY): Payer: Self-pay | Admitting: *Deleted

## 2015-04-25 DIAGNOSIS — Z79899 Other long term (current) drug therapy: Secondary | ICD-10-CM | POA: Insufficient documentation

## 2015-04-25 DIAGNOSIS — Z87440 Personal history of urinary (tract) infections: Secondary | ICD-10-CM | POA: Diagnosis not present

## 2015-04-25 DIAGNOSIS — Z8659 Personal history of other mental and behavioral disorders: Secondary | ICD-10-CM | POA: Diagnosis not present

## 2015-04-25 DIAGNOSIS — Z8742 Personal history of other diseases of the female genital tract: Secondary | ICD-10-CM | POA: Insufficient documentation

## 2015-04-25 DIAGNOSIS — Z720 Tobacco use: Secondary | ICD-10-CM | POA: Insufficient documentation

## 2015-04-25 DIAGNOSIS — E669 Obesity, unspecified: Secondary | ICD-10-CM | POA: Diagnosis not present

## 2015-04-25 DIAGNOSIS — Y9389 Activity, other specified: Secondary | ICD-10-CM | POA: Insufficient documentation

## 2015-04-25 DIAGNOSIS — Z8751 Personal history of pre-term labor: Secondary | ICD-10-CM | POA: Insufficient documentation

## 2015-04-25 DIAGNOSIS — Y92008 Other place in unspecified non-institutional (private) residence as the place of occurrence of the external cause: Secondary | ICD-10-CM | POA: Diagnosis not present

## 2015-04-25 DIAGNOSIS — Y998 Other external cause status: Secondary | ICD-10-CM | POA: Diagnosis not present

## 2015-04-25 DIAGNOSIS — S8262XA Displaced fracture of lateral malleolus of left fibula, initial encounter for closed fracture: Secondary | ICD-10-CM | POA: Insufficient documentation

## 2015-04-25 DIAGNOSIS — X58XXXA Exposure to other specified factors, initial encounter: Secondary | ICD-10-CM | POA: Insufficient documentation

## 2015-04-25 DIAGNOSIS — S99912A Unspecified injury of left ankle, initial encounter: Secondary | ICD-10-CM | POA: Diagnosis present

## 2015-04-25 DIAGNOSIS — Z86018 Personal history of other benign neoplasm: Secondary | ICD-10-CM | POA: Diagnosis not present

## 2015-04-25 MED ORDER — KETOROLAC TROMETHAMINE 60 MG/2ML IM SOLN
60.0000 mg | Freq: Once | INTRAMUSCULAR | Status: AC
  Administered 2015-04-25: 60 mg via INTRAMUSCULAR
  Filled 2015-04-25: qty 2

## 2015-04-25 MED ORDER — FENTANYL CITRATE (PF) 100 MCG/2ML IJ SOLN
100.0000 ug | Freq: Once | INTRAMUSCULAR | Status: AC
  Administered 2015-04-25: 100 ug via INTRAMUSCULAR
  Filled 2015-04-25: qty 2

## 2015-04-25 MED ORDER — ONDANSETRON 4 MG PO TBDP
4.0000 mg | ORAL_TABLET | Freq: Once | ORAL | Status: AC
  Administered 2015-04-25: 4 mg via ORAL
  Filled 2015-04-25: qty 1

## 2015-04-25 MED ORDER — IBUPROFEN 800 MG PO TABS: 800.0000 mg | ORAL_TABLET | Freq: Three times a day (TID) | ORAL | Status: DC

## 2015-04-25 MED ORDER — OXYCODONE-ACETAMINOPHEN 5-325 MG PO TABS: 1.0000 | ORAL_TABLET | Freq: Four times a day (QID) | ORAL | Status: DC | PRN

## 2015-04-25 NOTE — ED Notes (Signed)
Patient presents with left ankle swollen.  Stated she "rolled" her ankle

## 2015-04-25 NOTE — Discharge Instructions (Signed)
Ankle Fracture A fracture is a break in a bone. The ankle joint is made up of three bones. These include the lower (distal)sections of your lower leg bones, called the tibia and fibula, along with a bone in your foot, called the talus. Depending on how bad the break is and if more than one ankle joint bone is broken, a cast or splint is used to protect and keep your injured bone from moving while it heals. Sometimes, surgery is required to help the fracture heal properly.  There are two general types of fractures:  Stable fracture. This includes a single fracture line through one bone, with no injury to ankle ligaments. A fracture of the talus that does not have any displacement (movement of the bone on either side of the fracture line) is also stable.  Unstable fracture. This includes more than one fracture line through one or more bones in the ankle joint. It also includes fractures that have displacement of the bone on either side of the fracture line. CAUSES  A direct blow to the ankle.   Quickly and severely twisting your ankle.  Trauma, such as a car accident or falling from a significant height. RISK FACTORS You may be at a higher risk of ankle fracture if:  You have certain medical conditions.  You are involved in high-impact sports.  You are involved in a high-impact car accident. SIGNS AND SYMPTOMS   Tender and swollen ankle.  Bruising around the injured ankle.  Pain on movement of the ankle.  Difficulty walking or putting weight on the ankle.  A cold foot below the site of the ankle injury. This can occur if the blood vessels passing through your injured ankle were also damaged.  Numbness in the foot below the site of the ankle injury. DIAGNOSIS  An ankle fracture is usually diagnosed with a physical exam and X-rays. A CT scan may also be required for complex fractures. TREATMENT  Stable fractures are treated with a cast or splint and using crutches to avoid putting  weight on your injured ankle. This is followed by an ankle strengthening program. Some patients require a special type of cast, depending on other medical problems they may have. Unstable fractures require surgery to ensure the bones heal properly. Your health care provider will tell you what type of fracture you have and the best treatment for your condition. HOME CARE INSTRUCTIONS   Review correct crutch use with your health care provider and use your crutches as directed. Safe use of crutches is extremely important. Misuse of crutches can cause you to fall or cause injury to nerves in your hands or armpits.  Do not put weight or pressure on the injured ankle until directed by your health care provider.  To lessen the swelling, keep the injured leg elevated while sitting or lying down.  Apply ice to the injured area:  Put ice in a plastic bag.  Place a towel between your cast and the bag.  Leave the ice on for 20 minutes, 2-3 times a day.  If you have a plaster or fiberglass cast:  Do not try to scratch the skin under the cast with any objects. This can increase your risk of skin infection.  Check the skin around the cast every day. You may put lotion on any red or sore areas.  Keep your cast dry and clean.  If you have a plaster splint:  Wear the splint as directed.  You may loosen the elastic  around the splint if your toes become numb, tingle, or turn cold or blue.  Do not put pressure on any part of your cast or splint; it may break. Rest your cast only on a pillow the first 24 hours until it is fully hardened.  Your cast or splint can be protected during bathing with a plastic bag sealed to your skin with medical tape. Do not lower the cast or splint into water.  Take medicines as directed by your health care provider. Only take over-the-counter or prescription medicines for pain, discomfort, or fever as directed by your health care provider.  Do not drive a vehicle until  your health care provider specifically tells you it is safe to do so.  If your health care provider has given you a follow-up appointment, it is very important to keep that appointment. Not keeping the appointment could result in a chronic or permanent injury, pain, and disability. If you have any problem keeping the appointment, call the facility for assistance. SEEK MEDICAL CARE IF: You develop increased swelling or discomfort. SEEK IMMEDIATE MEDICAL CARE IF:   Your cast gets damaged or breaks.  You have continued severe pain.  You develop new pain or swelling after the cast was put on.  Your skin or toenails below the injury turn blue or gray.  Your skin or toenails below the injury feel cold, numb, or have loss of sensitivity to touch.  There is a bad smell or pus draining from under the cast. MAKE SURE YOU:   Understand these instructions.  Will watch your condition.  Will get help right away if you are not doing well or get worse.   This information is not intended to replace advice given to you by your health care provider. Make sure you discuss any questions you have with your health care provider.   Document Released: 06/03/2000 Document Revised: 06/11/2013 Document Reviewed: 01/03/2013 Elsevier Interactive Patient Education 2016 [employer]. Walking Boot A walking boot (controlled ankle motion boot or CAM walker) is a removable boot-shaped splint that holds your foot or ankle in place after an injury or a medical procedure. This helps with healing and prevents further injury. A walking boot has a stiff, rigid outer frame that limits movement and supports your leg and foot. The inner lining is a layer of padded material. Walking boots usually have several adjustable straps to secure them over the foot. Your health care provider may prescribe a walking boot if it is okay for you to use your injured foot to support your body weight. How much you can walk with the boot on will  depend on the type and severity of your injury. Your health care provider will recommend the best boot for you based on your condition. HOW DO I PUT ON MY WALKING BOOT? There are different types of walking boots. Each type of boot has specific instructions about how to wear it properly. Follow instructions from your health care provider about wearing yours. In general:  Sit down to put on your boot. This is more comfortable and it helps to prevent falls.  Open up the boot fully. Place your foot into the boot so that your heel rests against the back.  Your toes should be supported by the base of the boot, but they should not hang over the front.  Adjust the straps so the boot fits securely but is not too tight.  Do not bend the hard frame of the boot to get a  good fit.  Ask someone to help you put on the boot, if needed. WHAT ARE SOME TIPS FOR WALKING WITH A WALKING BOOT?  Do not try to walk without wearing the boot unless your health care provider has approved.  Rest your injured leg as much as possible.  Use other assistive walking devices as told by your health care provider. These include crutches and canes.  On your other foot, wear a shoe with a heel that is close to the height of the boot.  Be very careful when walking on surfaces that are uneven or wet. HOW CAN I REDUCE SWELLING?  Rest your injured foot or leg as much as possible.  If directed, apply ice to the injured area:  Put ice in a plastic bag.  Place a towel between your skin and the bag.  Leave the ice on for 20 minutes, 2-3 times a day for two days or as told by your health care provider.  Keep your injured leg raised (elevated) above the level of your heart for 2-3 hours each day or as told by your health care provider.  If swelling gets worse, loosen the boot and rest and raise your foot.  Contact your health care provider if swelling does not get better or if it gets worse over time. WHAT SKIN CARE  PRACTICES SHOULD I FOLLOW?  Wear a long sock to protect your foot and leg from rubbing inside the boot.  Take off the boot one time per day to check the injured area.  Follow instructions from your health care provider about taking care of your incision or wound, if this applies.  Clean and wash the injured area as told by your health care provider.  Gently dry your foot and leg before putting the boot back on.  Contact your health care provider if a wound is getting worse or if your skin becomes red, painful, or irritated. ARE THERE ANY ACTIVITY RESTRICTIONS? Activity restrictions depend on the type and severity of your injury. Follow instructions from your health care provider.  Bathe and shower as directed by your health care provider.  Do not do activities that could make your injury worse.  Do not drive if your affected foot is one that you usually use for driving. HOW SHOULD I KEEP MY BOOT CLEAN?  Clean the frame and the liner of the boot by hand. Use a washcloth with mild soap and water.  Do not use chemical cleaning products. These could irritate your skin, especially if you have a wound or an incision.  Do not soak the liner of the boot.  Do not put any part of the boot in a washing machine or a clothes dryer.  Allow the boot to air dry completely before you put it back on your foot.   This information is not intended to replace advice given to you by your health care provider. Make sure you discuss any questions you have with your health care provider.   Document Released: 10/21/2014 Document Reviewed: 10/21/2014 Elsevier Interactive Patient Education [employer].

## 2015-04-25 NOTE — ED Provider Notes (Signed)
CSN: [id number]     Arrival date & time 04/25/15  2036 History  By signing my name below, I, Starleen Arms, attest that this documentation has been prepared under the direction and in the presence of Delos Haring, PA-C. Electronically Signed: Starleen Arms ED Scribe. 04/25/2015. 9:48 PM.     Chief Complaint  Patient presents with  . Ankle Pain    The history is provided by the patient. No language interpreter was used.   HPI Comments: [name] is a 39 y.o. female who presents to the Emergency Department complaining of unchanging, constant, severe left ankle pain following a misstep from a bottom porch step about an 1.5 hours ago. Patient described a loud pop and difficulty rotating the ankle following the injury. Denies hitting her head, injuring her neck or loc. No other injuries. Patient denies loss of feeling or numbness in ankle. Reports significant pain and swelling, unable to bear weight.    Past Medical History  Diagnosis Date  . Fibroids   . History of preterm labor   . History of recurrent UTI (urinary tract infection)   . Uterine fibroids affecting pregnancy   . Obese   . Polyhydramnios 09/20/2011  . S/P cesarean section 09/20/2011  . Encounter for female sterilization procedure 12/06/2011  . S/P tubal ligation 12/07/2011  . SVD (spontaneous vaginal delivery)     x 2  . Mild preeclampsia 09/20/2011    Hix with 2013 pregnancy  PIH - resolved, no longer an issue  . Depression     no meds  . Anxiety     no meds  . Fibromyalgia     no meds  . Intramural leiomyoma of uterus 01/21/2014  . Adenomyosis 01/21/2014  . Abnormal uterine bleeding (AUB) 01/21/2014  . Pelvic pain in female 01/21/2014   Past Surgical History  Procedure Laterality Date  . Knee surgery      right - dislocated knee cap  . Cesarean section  09/20/2011    Procedure: CESAREAN SECTION;  Surgeon: Thornell Sartorius, MD;  Location: [facility];  Service: Gynecology;  Laterality: N/A;  . Laparoscopic tubal ligation  12/07/2011     Procedure: LAPAROSCOPIC TUBAL LIGATION;  Surgeon: Thornell Sartorius, MD;  Location: [facility];  Service: Gynecology;  Laterality: Bilateral;  . Cholecystectomy      Lap  . Laparoscopic assisted vaginal hysterectomy N/A 01/22/2014    Procedure: LAPAROSCOPIC ASSISTED VAGINAL HYSTERECTOMY, Bilateral Salpingectomy;  Surgeon: Janyth Contes, MD;  Location: [facility];  Service: Gynecology;  Laterality: N/A;   Family History  Problem Relation Age of Onset  . Cancer Mother     breast  . Hypertension Mother   . Heart disease Father   . Hypertension Father   . Asthma Sister   . Mental illness Sister     bipolar, schizophrenia,   . Ovarian cysts Sister   . Seizures Sister   . Diabetes Maternal Grandmother   . Depression Maternal Grandfather     suicide   Social History  Substance Use Topics  . Smoking status: Current Some Day Smoker -- 1.00 packs/day    Types: Cigarettes  . Smokeless tobacco: Never Used  . Alcohol Use: Yes     Comment: Socially   OB History    Gravida Para Term Preterm AB TAB SAB Ectopic Multiple Living   4 3 1 2 1  0 1 0 0 3     Review of Systems A complete 10 system review of systems was obtained and all systems  are negative except as noted in the HPI and PMH.     Allergies  Hydrocodone-acetaminophen  Home Medications   Prior to Admission medications   Medication Sig Start Date End Date Taking? Authorizing Provider  Aspirin-Salicylamide-Caffeine (BC HEADACHE POWDER PO) Take 1 Package by mouth once.    Historical Provider, MD  ferrous sulfate (FERROUSUL) 325 (65 FE) MG tablet Take 1 tablet (325 mg total) by mouth 2 (two) times daily with a meal. 01/23/14   Janyth Contes, MD  ibuprofen (ADVIL,MOTRIN) 800 MG tablet Take 1 tablet (800 mg total) by mouth every 8 (eight) hours as needed (mild pain). 01/23/14   Janyth Contes, MD  ibuprofen (ADVIL,MOTRIN) 800 MG tablet Take 1 tablet (800 mg total) by mouth 3 (three) times daily. 04/25/15   Delos Haring, PA-C   oxyCODONE-acetaminophen (PERCOCET/ROXICET) 5-325 MG per tablet Take 1-2 tablets by mouth every 6 (six) hours as needed for severe pain. 01/23/14   Janyth Contes, MD  oxyCODONE-acetaminophen (PERCOCET/ROXICET) 5-325 MG tablet Take 1-2 tablets by mouth every 6 (six) hours as needed. 04/25/15   Dymond Spreen Carlota Raspberry, PA-C   BP 161/100 mmHg  Pulse 96  Temp(Src) 98 F (36.7 C) (Oral)  Resp 18  Ht 5\' 8"  (1.727 m)  Wt 180 lb (81.647 kg)  BMI 27.38 kg/m2  SpO2 97%  LMP 01/08/2014 Physical Exam  Constitutional: She is oriented to person, place, and time. She appears well-developed and well-nourished. No distress.  HENT:  Head: Normocephalic and atraumatic.  Eyes: Conjunctivae and EOM are normal.  Neck: Neck supple. No tracheal deviation present.  Cardiovascular: Normal rate.   Pulmonary/Chest: Effort normal. No respiratory distress.  Musculoskeletal:       Left ankle: She exhibits decreased range of motion, swelling (significant lateral malleolar swelling) and ecchymosis. She exhibits no deformity, no laceration and normal pulse. Tenderness. Lateral malleolus tenderness found. No medial malleolus and no AITFL tenderness found. Achilles tendon normal.  NIV  Neurological: She is alert and oriented to person, place, and time.  Skin: Skin is warm and dry.  Psychiatric: She has a normal mood and affect. Her behavior is normal.  Nursing note and vitals reviewed.  ED Course  Procedures  DIAGNOSTIC STUDIES: Oxygen Saturation is 97% on RA, normal by my interpretation.    COORDINATION OF CARE: 9:08 PM Will order left ankle x-ray. Administered pain medication. Reviewed x-ray results with patient. Lateral malleolar fracture, discussed with Dr. Erlinda Hong, he recommends Cam walker and weight bearing as tolerated. He will see her in clinic in 1-2 weeks.   Will provide a cam-walker boot and crutches. Patient advised to apply ice and elevate foot frequently. Patient will follow up with specialist after swelling  reduced. Will prescribe pain medication and anti-inflammatory. (Percocet 30 tabs and Ibuprofen)  Labs Review Labs Reviewed - No data to display  Imaging Review Dg Ankle Complete Left  04/25/2015  CLINICAL DATA:  Left ankle rotational injury with loud pop. EXAM: LEFT ANKLE COMPLETE - 3+ VIEW COMPARISON:  None. FINDINGS: There is severe soft tissue swelling in the lateral left ankle. There is a nondisplaced transverse intra-articular fracture of the left lateral malleolus. No additional left ankle fracture. No subluxation. No suspicious focal osseous lesion. IMPRESSION: Transverse nondisplaced left lateral malleolus fracture with surrounding severe soft tissue swelling. No left ankle subluxation. Electronically Signed   By: Ilona Sorrel M.D.   On: 04/25/2015 21:21  l I have personally reviewed and evaluated these images and lab results as part of my medical decision-making.  EKG Interpretation None      MDM   Final diagnoses:  Fracture of left ankle, lateral malleolus, closed, initial encounter    Medications  ketorolac (TORADOL) injection 60 mg (not administered)  fentaNYL (SUBLIMAZE) injection 100 mcg (100 mcg Intramuscular Given 04/25/15 2102)  ondansetron (ZOFRAN-ODT) disintegrating tablet 4 mg (4 mg Oral Given 04/25/15 2102)    39 y.o.Halo R Pfeifle's medical screening exam was performed and I feel the patient has had an appropriate workup for their chief complaint at this time and likelihood of emergent condition existing is low. They have been counseled on decision, discharge, follow up and which symptoms necessitate immediate return to the emergency department. They or their family verbally stated understanding and agreement with plan and discharged in stable condition.   Vital signs are stable at discharge. Filed Vitals:   04/25/15 2041  BP: 161/100  Pulse: 96  Temp: 98 F (36.7 C)  Resp: 18    I personally performed the services described in this documentation, which was  scribed in my presence. The recorded information has been reviewed and is accurate.    Delos Haring, PA-C 04/25/15 2153  Lajean Saver, MD 04/25/15 [phone]

## 2015-06-16 ENCOUNTER — Emergency Department (HOSPITAL_COMMUNITY)
Admission: EM | Admit: 2015-06-16 | Discharge: 2015-06-16 | Disposition: A | Discharge status: Home / Self Care | Payer: Medicaid Other | Attending: Emergency Medicine | Admitting: Emergency Medicine

## 2015-06-16 ENCOUNTER — Emergency Department (HOSPITAL_COMMUNITY): Payer: Medicaid Other

## 2015-06-16 ENCOUNTER — Encounter (HOSPITAL_COMMUNITY): Payer: Self-pay | Admitting: Vascular Surgery

## 2015-06-16 DIAGNOSIS — F329 Major depressive disorder, single episode, unspecified: Secondary | ICD-10-CM | POA: Diagnosis not present

## 2015-06-16 DIAGNOSIS — M545 Low back pain: Secondary | ICD-10-CM | POA: Diagnosis present

## 2015-06-16 DIAGNOSIS — F419 Anxiety disorder, unspecified: Secondary | ICD-10-CM | POA: Diagnosis not present

## 2015-06-16 DIAGNOSIS — N23 Unspecified renal colic: Secondary | ICD-10-CM | POA: Diagnosis not present

## 2015-06-16 DIAGNOSIS — N838 Other noninflammatory disorders of ovary, fallopian tube and broad ligament: Secondary | ICD-10-CM

## 2015-06-16 DIAGNOSIS — Z87440 Personal history of urinary (tract) infections: Secondary | ICD-10-CM | POA: Diagnosis not present

## 2015-06-16 DIAGNOSIS — E669 Obesity, unspecified: Secondary | ICD-10-CM | POA: Diagnosis not present

## 2015-06-16 DIAGNOSIS — N839 Noninflammatory disorder of ovary, fallopian tube and broad ligament, unspecified: Secondary | ICD-10-CM | POA: Insufficient documentation

## 2015-06-16 DIAGNOSIS — R109 Unspecified abdominal pain: Secondary | ICD-10-CM

## 2015-06-16 DIAGNOSIS — Z86018 Personal history of other benign neoplasm: Secondary | ICD-10-CM | POA: Diagnosis not present

## 2015-06-16 DIAGNOSIS — Z8751 Personal history of pre-term labor: Secondary | ICD-10-CM | POA: Diagnosis not present

## 2015-06-16 DIAGNOSIS — F17210 Nicotine dependence, cigarettes, uncomplicated: Secondary | ICD-10-CM | POA: Diagnosis not present

## 2015-06-16 DIAGNOSIS — N83201 Unspecified ovarian cyst, right side: Secondary | ICD-10-CM

## 2015-06-16 LAB — I-STAT CHEM 8, ED
BUN: 10 mg/dL (ref 6–20)
CHLORIDE: 104 mmol/L (ref 101–111)
Calcium, Ion: 1.17 mmol/L (ref 1.12–1.23)
Creatinine, Ser: 0.6 mg/dL (ref 0.44–1.00)
GLUCOSE: 104 mg/dL — AB (ref 65–99)
HCT: 43 % (ref 36.0–46.0)
HEMOGLOBIN: 14.6 g/dL (ref 12.0–15.0)
POTASSIUM: 3.7 mmol/L (ref 3.5–5.1)
SODIUM: 140 mmol/L (ref 135–145)
TCO2: 25 mmol/L (ref 0–100)

## 2015-06-16 LAB — URINE MICROSCOPIC-ADD ON

## 2015-06-16 LAB — URINALYSIS, ROUTINE W REFLEX MICROSCOPIC
Bilirubin Urine: NEGATIVE
Glucose, UA: NEGATIVE mg/dL
KETONES UR: NEGATIVE mg/dL
Leukocytes, UA: NEGATIVE
Nitrite: NEGATIVE
PROTEIN: NEGATIVE mg/dL
SPECIFIC GRAVITY, URINE: 1.022 (ref 1.005–1.030)
pH: 5.5 (ref 5.0–8.0)

## 2015-06-16 MED ORDER — OXYCODONE-ACETAMINOPHEN 5-325 MG PO TABS
ORAL_TABLET | ORAL | Status: AC
  Filled 2015-06-16: qty 1

## 2015-06-16 MED ORDER — IBUPROFEN 600 MG PO TABS: 600.0000 mg | ORAL_TABLET | Freq: Three times a day (TID) | ORAL | Status: DC | PRN

## 2015-06-16 MED ORDER — OXYCODONE-ACETAMINOPHEN 5-325 MG PO TABS: 1.0000 | ORAL_TABLET | Freq: Four times a day (QID) | ORAL | Status: DC | PRN

## 2015-06-16 MED ORDER — OXYCODONE-ACETAMINOPHEN 5-325 MG PO TABS
1.0000 | ORAL_TABLET | Freq: Once | ORAL | Status: AC
  Administered 2015-06-16: 1 via ORAL

## 2015-06-16 MED ORDER — LORAZEPAM 1 MG PO TABS
1.0000 mg | ORAL_TABLET | Freq: Once | ORAL | Status: AC
  Administered 2015-06-16: 1 mg via ORAL
  Filled 2015-06-16: qty 1

## 2015-06-16 MED ORDER — ONDANSETRON HCL 4 MG PO TABS: 4.0000 mg | ORAL_TABLET | Freq: Three times a day (TID) | ORAL | Status: DC | PRN

## 2015-06-16 MED ORDER — KETOROLAC TROMETHAMINE 60 MG/2ML IM SOLN
60.0000 mg | Freq: Once | INTRAMUSCULAR | Status: AC
  Administered 2015-06-16: 60 mg via INTRAMUSCULAR
  Filled 2015-06-16: qty 2

## 2015-06-16 NOTE — ED Notes (Signed)
Pt transported to [facility]

## 2015-06-16 NOTE — Discharge Instructions (Signed)
Renal Colic Renal colic is pain that is caused by passing a kidney stone. The pain can be sharp and severe. It may be felt in the back, abdomen, side (flank), or groin. It can cause nausea. Renal colic can come and go. HOME CARE INSTRUCTIONS Watch your condition for any changes. The following actions may help to lessen any discomfort that you are feeling:  Take medicines only as directed by your health care provider.  Ask your health care provider if it is okay to take over-the-counter pain medicine.  Drink enough fluid to keep your urine clear or pale yellow. Drink 6-8 glasses of water each day.  Limit the amount of salt that you eat to less than 2 grams per day.  Reduce the amount of protein in your diet. Eat less meat, fish, nuts, and dairy.  Avoid foods such as spinach, rhubarb, nuts, or bran. These may make kidney stones more likely to form. SEEK MEDICAL CARE IF:  You have a fever or chills.  Your urine smells bad or looks cloudy.  You have pain or burning when you pass urine. SEEK IMMEDIATE MEDICAL CARE IF:  Your flank pain or groin pain suddenly worsens.  You become confused or disoriented or you lose consciousness.   This information is not intended to replace advice given to you by your health care provider. Make sure you discuss any questions you have with your health care provider.   Document Released: 03/16/2005 Document Revised: 06/27/2014 Document Reviewed: 04/16/2014 Elsevier Interactive Patient Education 2016 [employer] Ovarian Cyst An ovarian cyst is a fluid-filled sac that forms on an ovary. The ovaries are small organs that produce eggs in women. Various types of cysts can form on the ovaries. Most are not cancerous. Many do not cause problems, and they often go away on their own. Some may cause symptoms and require treatment. Common types of ovarian cysts include:  Functional cysts--These cysts may occur every month during the menstrual cycle. This is  normal. The cysts usually go away with the next menstrual cycle if the woman does not get pregnant. Usually, there are no symptoms with a functional cyst.  Endometrioma cysts--These cysts form from the tissue that lines the uterus. They are also called "chocolate cysts" because they become filled with blood that turns brown. This type of cyst can cause pain in the lower abdomen during intercourse and with your menstrual period.  Cystadenoma cysts--This type develops from the cells on the outside of the ovary. These cysts can get very big and cause lower abdomen pain and pain with intercourse. This type of cyst can twist on itself, cut off its blood supply, and cause severe pain. It can also easily rupture and cause a lot of pain.  Dermoid cysts--This type of cyst is sometimes found in both ovaries. These cysts may contain different kinds of body tissue, such as skin, teeth, hair, or cartilage. They usually do not cause symptoms unless they get very big.  Theca lutein cysts--These cysts occur when too much of a certain hormone (human chorionic gonadotropin) is produced and overstimulates the ovaries to produce an egg. This is most common after procedures used to assist with the conception of a baby (in vitro fertilization). CAUSES   Fertility drugs can cause a condition in which multiple large cysts are formed on the ovaries. This is called ovarian hyperstimulation syndrome.  A condition called polycystic ovary syndrome can cause hormonal imbalances that can lead to nonfunctional ovarian cysts. SIGNS AND SYMPTOMS  Many ovarian cysts do not cause symptoms. If symptoms are present, they may include:  Pelvic pain or pressure.  Pain in the lower abdomen.  Pain during sexual intercourse.  Increasing girth (swelling) of the abdomen.  Abnormal menstrual periods.  Increasing pain with menstrual periods.  Stopping having menstrual periods without being pregnant. DIAGNOSIS  These cysts are  commonly found during a routine or annual pelvic exam. Tests may be ordered to find out more about the cyst. These tests may include:  Ultrasound.  X-ray of the pelvis.  CT scan.  MRI.  Blood tests. TREATMENT  Many ovarian cysts go away on their own without treatment. Your health care provider may want to check your cyst regularly for 2-3 months to see if it changes. For women in menopause, it is particularly important to monitor a cyst closely because of the higher rate of ovarian cancer in menopausal women. When treatment is needed, it may include any of the following:  A procedure to drain the cyst (aspiration). This may be done using a long needle and ultrasound. It can also be done through a laparoscopic procedure. This involves using a thin, lighted tube with a tiny camera on the end (laparoscope) inserted through a small incision.  Surgery to remove the whole cyst. This may be done using laparoscopic surgery or an open surgery involving a larger incision in the lower abdomen.  Hormone treatment or birth control pills. These methods are sometimes used to help dissolve a cyst. HOME CARE INSTRUCTIONS   Only take over-the-counter or prescription medicines as directed by your health care provider.  Follow up with your health care provider as directed.  Get regular pelvic exams and Pap tests. SEEK MEDICAL CARE IF:   Your periods are late, irregular, or painful, or they stop.  Your pelvic pain or abdominal pain does not go away.  Your abdomen becomes larger or swollen.  You have pressure on your bladder or trouble emptying your bladder completely.  You have pain during sexual intercourse.  You have feelings of fullness, pressure, or discomfort in your stomach.  You lose weight for no apparent reason.  You feel generally ill.  You become constipated.  You lose your appetite.  You develop acne.  You have an increase in body and facial hair.  You are gaining weight,  without changing your exercise and eating habits.  You think you are pregnant. SEEK IMMEDIATE MEDICAL CARE IF:   You have increasing abdominal pain.  You feel sick to your stomach (nauseous), and you throw up (vomit).  You develop a fever that comes on suddenly.  You have abdominal pain during a bowel movement.  Your menstrual periods become heavier than usual. MAKE SURE YOU:  Understand these instructions.  Will watch your condition.  Will get help right away if you are not doing well or get worse.   This information is not intended to replace advice given to you by your health care provider. Make sure you discuss any questions you have with your health care provider.   Document Released: 06/06/2005 Document Revised: 06/11/2013 Document Reviewed: 02/11/2013 Elsevier Interactive Patient Education [employer].

## 2015-06-16 NOTE — ED Provider Notes (Signed)
CSN: [id number]     Arrival date & time 06/16/15  1455 History   First MD Initiated Contact with Patient 06/16/15 1836     Chief Complaint  Patient presents with  . Back Pain     (Consider location/radiation/quality/duration/timing/severity/associated sxs/prior Treatment) HPI H presents with right flank pain starting yesterday. Pain has been episodic. It radiates to her right groin. Associated with difficulty urinating and pressure in the bladder. She also had episode of gross hematuria yesterday evening. Denies any fevers or chills. Currently her pain is improved. No dysuria, frequency. No history of renal stones. No vaginal discharge or bleeding. Patient's had a previous hysterectomy. No previously similar symptoms. No radiation down the leg. No weakness or numbness. Past Medical History  Diagnosis Date  . Fibroids   . History of preterm labor   . History of recurrent UTI (urinary tract infection)   . Uterine fibroids affecting pregnancy   . Obese   . Polyhydramnios 09/20/2011  . S/P cesarean section 09/20/2011  . Encounter for female sterilization procedure 12/06/2011  . S/P tubal ligation 12/07/2011  . SVD (spontaneous vaginal delivery)     x 2  . Mild preeclampsia 09/20/2011    Hix with 2013 pregnancy  PIH - resolved, no longer an issue  . Depression     no meds  . Anxiety     no meds  . Fibromyalgia     no meds  . Intramural leiomyoma of uterus 01/21/2014  . Adenomyosis 01/21/2014  . Abnormal uterine bleeding (AUB) 01/21/2014  . Pelvic pain in female 01/21/2014   Past Surgical History  Procedure Laterality Date  . Knee surgery      right - dislocated knee cap  . Cesarean section  09/20/2011    Procedure: CESAREAN SECTION;  Surgeon: Thornell Sartorius, MD;  Location: [facility];  Service: Gynecology;  Laterality: N/A;  . Laparoscopic tubal ligation  12/07/2011    Procedure: LAPAROSCOPIC TUBAL LIGATION;  Surgeon: Thornell Sartorius, MD;  Location: [facility];  Service: Gynecology;  Laterality: Bilateral;   . Cholecystectomy      Lap  . Laparoscopic assisted vaginal hysterectomy N/A 01/22/2014    Procedure: LAPAROSCOPIC ASSISTED VAGINAL HYSTERECTOMY, Bilateral Salpingectomy;  Surgeon: Janyth Contes, MD;  Location: [facility];  Service: Gynecology;  Laterality: N/A;   Family History  Problem Relation Age of Onset  . Cancer Mother     breast  . Hypertension Mother   . Heart disease Father   . Hypertension Father   . Asthma Sister   . Mental illness Sister     bipolar, schizophrenia,   . Ovarian cysts Sister   . Seizures Sister   . Diabetes Maternal Grandmother   . Depression Maternal Grandfather     suicide   Social History  Substance Use Topics  . Smoking status: Current Some Day Smoker -- 1.00 packs/day    Types: Cigarettes  . Smokeless tobacco: Never Used  . Alcohol Use: Yes     Comment: Socially   OB History    Gravida Para Term Preterm AB TAB SAB Ectopic Multiple Living   4 3 1 2 1  0 1 0 0 3     Review of Systems  Constitutional: Negative for fever and chills.  Respiratory: Negative for shortness of breath.   Cardiovascular: Negative for chest pain.  Gastrointestinal: Positive for nausea and abdominal pain. Negative for vomiting, diarrhea and constipation.  Genitourinary: Positive for hematuria and flank pain. Negative for dysuria, frequency, vaginal bleeding, vaginal discharge and  difficulty urinating.  Musculoskeletal: Positive for back pain. Negative for neck pain and neck stiffness.  Skin: Negative for rash and wound.  Neurological: Negative for dizziness, weakness, light-headedness, numbness and headaches.  All other systems reviewed and are negative.     Allergies  Hydrocodone-acetaminophen  Home Medications   Prior to Admission medications   Medication Sig Start Date End Date Taking? Authorizing Provider  ferrous sulfate (FERROUSUL) 325 (65 FE) MG tablet Take 1 tablet (325 mg total) by mouth 2 (two) times daily with a meal. Patient not taking:  Reported on 06/16/2015 01/23/14   Janyth Contes, MD  ibuprofen (ADVIL,MOTRIN) 600 MG tablet Take 1 tablet (600 mg total) by mouth every 8 (eight) hours as needed for fever or moderate pain (mild pain). 06/16/15   Julianne Rice, MD  ondansetron (ZOFRAN) 4 MG tablet Take 1 tablet (4 mg total) by mouth every 8 (eight) hours as needed for nausea or vomiting. 06/16/15   Julianne Rice, MD  oxyCODONE-acetaminophen (PERCOCET/ROXICET) 5-325 MG tablet Take 1-2 tablets by mouth every 6 (six) hours as needed for severe pain. 06/16/15   Julianne Rice, MD   BP 115/88 mmHg  Pulse 72  Temp(Src) 98.3 F (36.8 C) (Oral)  Resp 18  Ht 5\' 8"  (1.727 m)  Wt 210 lb (95.255 kg)  BMI 31.94 kg/m2  SpO2 98%  LMP 01/08/2014 Physical Exam  Constitutional: She is oriented to person, place, and time. She appears well-developed and well-nourished. No distress.  HENT:  Head: Normocephalic and atraumatic.  Mouth/Throat: Oropharynx is clear and moist.  Eyes: EOM are normal. Pupils are equal, round, and reactive to light.  Neck: Normal range of motion. Neck supple.  Cardiovascular: Normal rate and regular rhythm.   Pulmonary/Chest: Effort normal and breath sounds normal. No respiratory distress. She has no wheezes. She has no rales.  Abdominal: Soft. Bowel sounds are normal. She exhibits no distension and no mass. There is no tenderness. There is no rebound and no guarding.  Musculoskeletal: Normal range of motion. She exhibits no edema or tenderness.  No CVA tenderness bilaterally. No midline thoracic or lumbar tenderness. The lower extremity swelling or pain. Negative straight leg raise bilaterally. Distal pulses intact.  Neurological: She is alert and oriented to person, place, and time.  5/5 motor in all extremities. Sensation fully intact. Ambulating without difficulty.  Skin: Skin is warm and dry. No rash noted. No erythema.  Psychiatric: She has a normal mood and affect. Her behavior is normal.  Nursing  note and vitals reviewed.   ED Course  Procedures (including critical care time) Labs Review Labs Reviewed  URINALYSIS, ROUTINE W REFLEX MICROSCOPIC (NOT AT [facility]) - Abnormal; Notable for the following:    Hgb urine dipstick SMALL (*)    All other components within normal limits  URINE MICROSCOPIC-ADD ON - Abnormal; Notable for the following:    Squamous Epithelial / LPF 0-5 (*)    Bacteria, UA RARE (*)    All other components within normal limits  I-STAT CHEM 8, ED - Abnormal; Notable for the following:    Glucose, Bld 104 (*)    All other components within normal limits    Imaging Review Ct Abdomen Pelvis Wo Contrast  06/16/2015  CLINICAL DATA:  Right flank pain, worsening last night. Hematuria. Prior hysterectomy. History uterine fibroids. EXAM: CT ABDOMEN AND PELVIS WITHOUT CONTRAST TECHNIQUE: Multidetector CT imaging of the abdomen and pelvis was performed following the standard protocol without IV contrast. COMPARISON:  None. FINDINGS: Lower chest: Clear  lung bases. Normal heart size without pericardial or pleural effusion. Hepatobiliary: Mild hepatic steatosis. Cholecystectomy, without biliary ductal dilatation. Pancreas: Normal, without mass or ductal dilatation. Spleen: Normal in size, without focal abnormality. Adrenals/Urinary Tract: Normal adrenal glands. Punctate bilateral renal collecting system calculi. No hydronephrosis. No hydroureter or ureteric calculi. No bladder calculi. Stomach/Bowel: Normal stomach, without wall thickening. Colonic stool burden suggests constipation. Normal terminal ileum and appendix. Normal small bowel. Vascular/Lymphatic: Normal caliber of the aorta and branch vessels. No abdominopelvic adenopathy. Reproductive: Hysterectomy. Right pelvic centrally low-density lesion measures 4.7 x 6.0 cm and is favored to arise from the right ovary. Example image 78/ series 2. Normal left ovary/ adnexa on the same image. Other: No significant free fluid.  Musculoskeletal: Sclerosis involving the bilateral sacroiliac joints is likely degenerative. IMPRESSION: 1. Bilateral punctate renal calculi.  No obstructive uropathy. 2.  Possible constipation. 3. Right pelvic low-density mass is favored to be ovarian in origin. Recommend further evaluation with pelvic ultrasound. If the patient's pain is possibly attributed to the right ovary, consider concurrent Doppler evaluation to exclude torsion. 4. Mild hepatic steatosis. Electronically Signed   By: Abigail Miyamoto M.D.   On: 06/16/2015 19:56   US Transvaginal Non-ob  06/16/2015  CLINICAL DATA:  Cystic lesion on recent CT with pelvic pain, initial encounter EXAM: TRANSABDOMINAL AND TRANSVAGINAL ULTRASOUND OF PELVIS DOPPLER ULTRASOUND OF OVARIES TECHNIQUE: Both transabdominal and transvaginal ultrasound examinations of the pelvis were performed. Transabdominal technique was performed for global imaging of the pelvis including uterus, ovaries, adnexal regions, and pelvic cul-de-sac. It was necessary to proceed with endovaginal exam following the transabdominal exam to visualize the ovaries. Color and duplex Doppler ultrasound was utilized to evaluate blood flow to the ovaries. COMPARISON:  None. FINDINGS: Uterus Surgically removed Right ovary Measurements: 6.7 x 4.7 x 5.1 cm. Within the right ovary there are 3 separate areas 2 of which are hyperechoic and 1 hypoechoic. The hyperechoic areas may represent hemorrhagic cysts. The hypoechoic area clearly represents a cyst. Normal arterial and venous flow is noted. No findings to suggest torsion are noted. Left ovary Measurements: 2.2 x 1.8 x 2.9 cm. Normal appearance/no adnexal mass. Pulsed Doppler evaluation of both ovaries demonstrates normal low-resistance arterial and venous waveforms. Other findings No abnormal free fluid. IMPRESSION: No evidence of ovarian torsion Status post hysterectomy. Changes in the right ovary likely related to multiple cystic lesions. Two of the 3  are hyperechoic consistent with hemorrhagic cysts. Short-term follow-up in 6-8 weeks is recommended to assess for stability. Electronically Signed   By: Inez Catalina M.D.   On: 06/16/2015 22:22   US Pelvis Complete  06/16/2015  CLINICAL DATA:  Cystic lesion on recent CT with pelvic pain, initial encounter EXAM: TRANSABDOMINAL AND TRANSVAGINAL ULTRASOUND OF PELVIS DOPPLER ULTRASOUND OF OVARIES TECHNIQUE: Both transabdominal and transvaginal ultrasound examinations of the pelvis were performed. Transabdominal technique was performed for global imaging of the pelvis including uterus, ovaries, adnexal regions, and pelvic cul-de-sac. It was necessary to proceed with endovaginal exam following the transabdominal exam to visualize the ovaries. Color and duplex Doppler ultrasound was utilized to evaluate blood flow to the ovaries. COMPARISON:  None. FINDINGS: Uterus Surgically removed Right ovary Measurements: 6.7 x 4.7 x 5.1 cm. Within the right ovary there are 3 separate areas 2 of which are hyperechoic and 1 hypoechoic. The hyperechoic areas may represent hemorrhagic cysts. The hypoechoic area clearly represents a cyst. Normal arterial and venous flow is noted. No findings to suggest torsion are noted. Left ovary Measurements:  2.2 x 1.8 x 2.9 cm. Normal appearance/no adnexal mass. Pulsed Doppler evaluation of both ovaries demonstrates normal low-resistance arterial and venous waveforms. Other findings No abnormal free fluid. IMPRESSION: No evidence of ovarian torsion Status post hysterectomy. Changes in the right ovary likely related to multiple cystic lesions. Two of the 3 are hyperechoic consistent with hemorrhagic cysts. Short-term follow-up in 6-8 weeks is recommended to assess for stability. Electronically Signed   By: Inez Catalina M.D.   On: 06/16/2015 22:22   [place] Art/ven Flow Abd Pelv Doppler  06/16/2015  CLINICAL DATA:  Cystic lesion on recent CT with pelvic pain, initial encounter EXAM: TRANSABDOMINAL AND  TRANSVAGINAL ULTRASOUND OF PELVIS DOPPLER ULTRASOUND OF OVARIES TECHNIQUE: Both transabdominal and transvaginal ultrasound examinations of the pelvis were performed. Transabdominal technique was performed for global imaging of the pelvis including uterus, ovaries, adnexal regions, and pelvic cul-de-sac. It was necessary to proceed with endovaginal exam following the transabdominal exam to visualize the ovaries. Color and duplex Doppler ultrasound was utilized to evaluate blood flow to the ovaries. COMPARISON:  None. FINDINGS: Uterus Surgically removed Right ovary Measurements: 6.7 x 4.7 x 5.1 cm. Within the right ovary there are 3 separate areas 2 of which are hyperechoic and 1 hypoechoic. The hyperechoic areas may represent hemorrhagic cysts. The hypoechoic area clearly represents a cyst. Normal arterial and venous flow is noted. No findings to suggest torsion are noted. Left ovary Measurements: 2.2 x 1.8 x 2.9 cm. Normal appearance/no adnexal mass. Pulsed Doppler evaluation of both ovaries demonstrates normal low-resistance arterial and venous waveforms. Other findings No abnormal free fluid. IMPRESSION: No evidence of ovarian torsion Status post hysterectomy. Changes in the right ovary likely related to multiple cystic lesions. Two of the 3 are hyperechoic consistent with hemorrhagic cysts. Short-term follow-up in 6-8 weeks is recommended to assess for stability. Electronically Signed   By: Inez Catalina M.D.   On: 06/16/2015 22:22   I have personally reviewed and evaluated these images and lab results as part of my medical decision-making.   EKG Interpretation None      MDM   Final diagnoses:  Right flank pain  Renal colic on right side  Cyst of right ovary   Patient is feeling much better. Benign abdominal exam. CT with evidence of bilateral punctate renal stones but no obstruction or hydronephrosis. Patient with a right ovarian mass consistent with hemorrhagic and ovarian cyst. Discussed  findings with patient. She will follow-up with her OB/GYN. She's been given return precautions. She understands to return immediately for any worsening pain, persistent vomiting, fever or for any concerns.     Julianne Rice, MD 06/16/15 2241

## 2015-06-16 NOTE — ED Notes (Signed)
Pt reports to the ED for eval of right hip pain right lower back pain/flank pain and hematuria that began today. Pain is intermittent in nature. Pt denies any dysuria. Has hx of urinary frequency and urgency at baseline. She has the sensation of not emptying her bladder and then when she stands up she will leak urine x past several days. Pt A&Ox4, resp e/u, and skin warm and dry.

## 2015-07-02 ENCOUNTER — Encounter: Payer: Self-pay | Admitting: Family Medicine

## 2015-07-02 ENCOUNTER — Ambulatory Visit (INDEPENDENT_AMBULATORY_CARE_PROVIDER_SITE_OTHER): Payer: Medicaid Other | Admitting: Family Medicine

## 2015-07-02 VITALS — BP 144/95 | HR 102 | Temp 98.0°F | Ht 68.0 in | Wt 202.0 lb

## 2015-07-02 DIAGNOSIS — N200 Calculus of kidney: Secondary | ICD-10-CM

## 2015-07-02 DIAGNOSIS — F329 Major depressive disorder, single episode, unspecified: Secondary | ICD-10-CM

## 2015-07-02 DIAGNOSIS — Z23 Encounter for immunization: Secondary | ICD-10-CM

## 2015-07-02 DIAGNOSIS — F32A Depression, unspecified: Secondary | ICD-10-CM

## 2015-07-02 DIAGNOSIS — Z0000 Encounter for general adult medical examination without abnormal findings: Secondary | ICD-10-CM

## 2015-07-02 DIAGNOSIS — N83201 Unspecified ovarian cyst, right side: Secondary | ICD-10-CM | POA: Diagnosis not present

## 2015-07-02 LAB — COMPLETE METABOLIC PANEL WITH GFR
ALK PHOS: 65 U/L (ref 33–115)
ALT: 15 U/L (ref 6–29)
AST: 13 U/L (ref 10–30)
Albumin: 4.4 g/dL (ref 3.6–5.1)
BUN: 12 mg/dL (ref 7–25)
CALCIUM: 9.8 mg/dL (ref 8.6–10.2)
CHLORIDE: 105 mmol/L (ref 98–110)
CO2: 20 mmol/L (ref 20–31)
CREATININE: 0.57 mg/dL (ref 0.50–1.10)
GFR, Est African American: >89 mL/min (ref >=60)
GFR, Est Non African American: >89 mL/min (ref >=60)
Glucose, Bld: 75 mg/dL (ref 65–99)
Potassium: 4.2 mmol/L (ref 3.5–5.3)
Sodium: 139 mmol/L (ref 135–146)
Total Bilirubin: 0.4 mg/dL (ref 0.2–1.2)
Total Protein: 7.3 g/dL (ref 6.1–8.1)

## 2015-07-02 LAB — CBC WITH DIFFERENTIAL/PLATELET
BASOS ABS: 0.1 10*3/uL (ref 0.0–0.1)
BASOS PCT: 1 % (ref 0–1)
EOS ABS: 0.2 10*3/uL (ref 0.0–0.7)
Eosinophils Relative: 2 % (ref 0–5)
HCT: 41.9 % (ref 36.0–46.0)
HEMOGLOBIN: 14.2 g/dL (ref 12.0–15.0)
Lymphocytes Relative: 19 % (ref 12–46)
Lymphs Abs: 1.6 10*3/uL (ref 0.7–4.0)
MCH: 30.7 pg (ref 26.0–34.0)
MCHC: 33.9 g/dL (ref 30.0–36.0)
MCV: 90.5 fL (ref 78.0–100.0)
MPV: 10.5 fL (ref 8.6–12.4)
Monocytes Absolute: 0.8 10*3/uL (ref 0.1–1.0)
Monocytes Relative: 9 % (ref 3–12)
NEUTROS ABS: 5.9 10*3/uL (ref 1.7–7.7)
NEUTROS PCT: 69 % (ref 43–77)
PLATELETS: 231 10*3/uL (ref 150–400)
RBC: 4.63 MIL/uL (ref 3.87–5.11)
RDW: 13.5 % (ref 11.5–15.5)
WBC: 8.5 10*3/uL (ref 4.0–10.5)

## 2015-07-02 LAB — LIPID PANEL
CHOL/HDL RATIO: 6.4 ratio — AB (ref <=5.0)
CHOLESTEROL: 223 mg/dL — AB (ref 125–200)
HDL: 35 mg/dL — AB (ref >=46)
LDL Cholesterol: 144 mg/dL — ABNORMAL HIGH (ref <130)
Triglycerides: 218 mg/dL — ABNORMAL HIGH (ref <150)
VLDL: 44 mg/dL — ABNORMAL HIGH (ref <30)

## 2015-07-02 NOTE — Progress Notes (Signed)
Patient ID: [name], female   DOB: Jul 19, 1975, 40 y.o.   MRN: [id number]   [name], is a 40 y.o. female  [id number]  [id number]  DOB - 08-Dec-1975  CC:  Chief Complaint  Patient presents with  . new patient to get established    has Medicaid no PCP needs referrals for kidney stones, and ovarian mass seen recently on CT, uses Dr Melba Coon        HPI: [name] is a 40 y.o. female here to establish care. She was seen in ED in late December for pelvic pain. She was found to have both renal stones, bilaterallly and ovarian cysts on the right. She is here to establish care as she needs referrals to specialist with her medicaid. She continues to have intermittent pain. She has a complex GYN history. She had a hysterectomy for fibroids with heavy bleeding about 2 years ago. She has a history of depression and fibromyalgia. She has been on cymbalta and lyrica in the past but nothing currently. She is will to consider a mental health referral for her depression.  She denies health problems other than those mentioned and listed in [place].  She does smoke 1/2 pack of cigarettes daily and is trying to quit. She uses rare alcohol and denies illicit drug use. She is in need of a flu shot today and has agreed to that. Her tetanus is UTD>  Allergies  Allergen Reactions  . Hydrocodone-Acetaminophen Itching and Nausea And Vomiting    And dizziness   Past Medical History  Diagnosis Date  . Fibroids   . History of preterm labor   . History of recurrent UTI (urinary tract infection)   . Uterine fibroids affecting pregnancy   . Obese   . Polyhydramnios 09/20/2011  . S/P cesarean section 09/20/2011  . Encounter for female sterilization procedure 12/06/2011  . S/P tubal ligation 12/07/2011  . SVD (spontaneous vaginal delivery)     x 2  . Mild preeclampsia 09/20/2011    Hix with 2013 pregnancy  PIH - resolved, no longer an issue  . Depression     no meds  . Anxiety     no meds  . Fibromyalgia     no  meds  . Intramural leiomyoma of uterus 01/21/2014  . Adenomyosis 01/21/2014  . Abnormal uterine bleeding (AUB) 01/21/2014  . Pelvic pain in female 01/21/2014   Current Outpatient Prescriptions on File Prior to Visit  Medication Sig Dispense Refill  . ibuprofen (ADVIL,MOTRIN) 600 MG tablet Take 1 tablet (600 mg total) by mouth every 8 (eight) hours as needed for fever or moderate pain (mild pain). 30 tablet 0   No current facility-administered medications on file prior to visit.   Family History  Problem Relation Age of Onset  . Cancer Mother     breast  . Hypertension Mother   . Heart disease Father   . Hypertension Father   . Asthma Sister   . Mental illness Sister     bipolar, schizophrenia,   . Ovarian cysts Sister   . Seizures Sister   . Diabetes Maternal Grandmother   . Depression Maternal Grandfather     suicide   Social History   Social History  . Marital Status: Single    Spouse Name: N/A  . Number of Children: N/A  . Years of Education: N/A   Occupational History  . Not on file.   Social History Main Topics  . Smoking status: Current Some Day Smoker --  1.00 packs/day    Types: Cigarettes  . Smokeless tobacco: Never Used  . Alcohol Use: Yes     Comment: Socially  . Drug Use: No  . Sexual Activity: Yes    Birth Control/ Protection: Surgical   Other Topics Concern  . Not on file   Social History Narrative    Review of Systems: Constitutional: Negative for fever, chills, appetite change, weight loss,  Positive for fatigue Skin: Negative for rashes or lesions of concern. HENT: Negative for ear pain, ear discharge.nose bleeds Eyes: Negative for pain, discharge, redness, itching and visual disturbance. Contacts for vision Neck: Negative for pain, stiffness Respiratory: Negative for cough, shortness of breath,   Cardiovascular: Negative for chest pain, palpitations and leg swelling. Gastrointestinal: Negative for abdominal pain, vomiting, diarrhea,  constipations. Positive for occassional nausea related to pelvic pain. Genitourinary: Negative for dysuria, urgency, frequenc. Positive for recent hematuria. Musculoskeletal: Positive for lower back pain related to renal calculi and ovarian cyst. Positive for generalized myalgias. Negative for swelling, and gait problem.Negative for weakness. Neurological: Negative for dizziness, tremors, seizures, syncope,   light-headedness, numbness and headaches.  Hematological: Negative for easy bruising or bleeding Psychiatric/Behavioral:Positive  for depression, anxiety.  Objective:   Filed Vitals:   07/02/15 0811  BP: 144/95  Pulse: 102  Temp: 98 F (36.7 C)    Physical Exam: Constitutional: Patient appears well-developed and well-nourished. No distress. HENT: Normocephalic, atraumatic, External right and left ear normal. Oropharynx is clear and moist.  Eyes: Conjunctivae and EOM are normal. PERRLA, no scleral icterus. Neck: Normal ROM. Neck supple. No lymphadenopathy, No thyromegaly. CVS: RRR, S1/S2 +, no murmurs, no gallops, no rubs Pulmonary: Effort and breath sounds normal, no stridor, rhonchi, wheezes, rales.  Abdominal: Soft. Normoactive BS,, no distension, tenderness, rebound or guarding.  Musculoskeletal: Normal range of motion. No edema and no tenderness. (Left ankle and foot in support due to ankle fracture in late November. Recently out of rigid support. Neuro: Alert.Normal muscle tone coordination. Non-focal Skin: Skin is warm and dry. No rash noted. Not diaphoretic. No erythema. No pallor. Psychiatric: Normal mood and affect. Behavior, judgment, thought content normal.  Lab Results  Component Value Date   WBC 14.0* 01/23/2014   HGB 14.6 06/16/2015   HCT 43.0 06/16/2015   MCV 87.4 01/23/2014   PLT 205 01/23/2014   Lab Results  Component Value Date   CREATININE 0.60 06/16/2015   BUN 10 06/16/2015   NA 140 06/16/2015   K 3.7 06/16/2015   CL 104 06/16/2015   CO2 26  01/23/2014    No results found for: HGBA1C Lipid Panel  No results found for: CHOL, TRIG, HDL, CHOLHDL, VLDL, LDLCALC     Assessment and plan:   1. Health care maintenance  - COMPLETE METABOLIC PANEL WITH GFR - CBC w/Diff - Lipid panel - TSH  2. Cyst of right ovary  - Ambulatory referral to Gynecology  3. Renal calculi - Ambulatory referral to Urology  4. Depression  - Ambulatory referral to Psychiatry  5. Need for prophylactic vaccination and inoculation against influenza - Flu Vaccine QUAD 36+ mos IM (Fluarix & Fluzone Quad PF   Return in about 6 months (around 12/30/2015).  And prn. The patient was given clear instructions to go to ER or return to medical center if symptoms don't improve, worsen or new problems develop. The patient verbalized understanding.    Micheline Chapman FNP  07/02/2015, 9:12 AM

## 2015-07-02 NOTE — Patient Instructions (Signed)
Someone will call you about your referrals. You may call GYN in a few days.

## 2015-07-03 LAB — TSH: TSH: 1.945 u[IU]/mL (ref 0.350–4.500)

## 2015-07-06 ENCOUNTER — Other Ambulatory Visit (HOSPITAL_COMMUNITY): Payer: Self-pay | Admitting: Family Medicine

## 2015-07-06 ENCOUNTER — Telehealth: Payer: Self-pay

## 2015-07-06 MED ORDER — PRAVASTATIN SODIUM 40 MG PO TABS: 40.0000 mg | ORAL_TABLET | Freq: Every day | ORAL | Status: DC

## 2015-07-06 NOTE — Telephone Encounter (Signed)
-----   Message from Micheline Chapman, NP sent at 07/06/2015  8:06 AM EST ----- Cmet normal. Cholesterol 223, Trig 218, LDL 144. Need to prescribe something for cholesterol. CBC wnl.

## 2015-07-06 NOTE — Telephone Encounter (Signed)
CMA called patient, patient verified name and DOB. Patient was given lab results, verbalized that she understood. Patient asked about referrals to OBgyn, Psychiatrist, and Neurologist.   CMA called patient and verified name and DOB. Patient was given the information that the referrals to psychiatrist and neurologist will be contacting her for an appt. Obgyn,she would have to call to make an appt and to verify they received the referral. Patient verbalized that she understood with no further questions.

## 2015-08-04 ENCOUNTER — Encounter (HOSPITAL_COMMUNITY): Payer: Self-pay | Admitting: Obstetrics and Gynecology

## 2015-08-04 DIAGNOSIS — N801 Endometriosis of ovary: Secondary | ICD-10-CM

## 2015-08-04 DIAGNOSIS — N80129 Deep endometriosis of ovary, unspecified ovary: Secondary | ICD-10-CM

## 2015-08-04 HISTORY — DX: Deep endometriosis of ovary, unspecified ovary: N80.129

## 2015-08-04 HISTORY — DX: Endometriosis of ovary: N80.1

## 2015-08-04 NOTE — H&P (Signed)
[name] is an 40 y.o. [phone] with pelvic pain an R ovarian endometriomas x 2 on TVUS.  Also has follicular appearing ovarian cysts x 2 on left.  Pt has h/o LAVH salpingectomy for adenomyosis.  D/W pt r/b/a of USO, poss left ovarian cystectomy.  Pt voices understanding.  On review of [place] on Left - cysts appear follicular, poss hemorrhagic cyst.  Cyst on R are 3 x 2.5 x 2.5 cm and 2 x 2 cm ground glass appearing cysts.  These cysts are c/w endometriomas.    Pertinent Gynecological History: S/p LAVH, B salpingectomy 8/15 - Adenomyosis BTL 1/14 No abn pap No STD SVD 7#11-8#15  Menstrual History:  Patient's last menstrual period was 01/08/2014.    Past Medical History  Diagnosis Date  . Fibroids   . History of preterm labor   . History of recurrent UTI (urinary tract infection)   . Uterine fibroids affecting pregnancy   . Obese   . Polyhydramnios 09/20/2011  . S/P cesarean section 09/20/2011  . Encounter for female sterilization procedure 12/06/2011  . S/P tubal ligation 12/07/2011  . SVD (spontaneous vaginal delivery)     x 2  . Mild preeclampsia 09/20/2011    Hix with 2013 pregnancy  PIH - resolved, no longer an issue  . Depression     no meds  . Anxiety     no meds  . Fibromyalgia     no meds  . Intramural leiomyoma of uterus 01/21/2014  . Adenomyosis 01/21/2014  . Abnormal uterine bleeding (AUB) 01/21/2014  . Pelvic pain in female 01/21/2014  . Endometrioma of ovary 08/04/2015    Past Surgical History  Procedure Laterality Date  . Knee surgery      right - dislocated knee cap  . Cesarean section  09/20/2011    Procedure: CESAREAN SECTION;  Surgeon: Thornell Sartorius, MD;  Location: [facility];  Service: Gynecology;  Laterality: N/A;  . Laparoscopic tubal ligation  12/07/2011    Procedure: LAPAROSCOPIC TUBAL LIGATION;  Surgeon: Thornell Sartorius, MD;  Location: [facility];  Service: Gynecology;  Laterality: Bilateral;  . Cholecystectomy      Lap  . Laparoscopic assisted vaginal hysterectomy N/A  01/22/2014    Procedure: LAPAROSCOPIC ASSISTED VAGINAL HYSTERECTOMY, Bilateral Salpingectomy;  Surgeon: Janyth Contes, MD;  Location: [facility];  Service: Gynecology;  Laterality: N/A;    Family History  Problem Relation Age of Onset  . Cancer Mother     breast  . Hypertension Mother   . Heart disease Father   . Hypertension Father   . Asthma Sister   . Mental illness Sister     bipolar, schizophrenia,   . Ovarian cysts Sister   . Seizures Sister   . Diabetes Maternal Grandmother   . Depression Maternal Grandfather     suicide    Social History:  reports that she has been smoking Cigarettes.  She has been smoking about 1.00 pack per day. She has never used smokeless tobacco. She reports that she drinks alcohol. She reports that she does not use illicit drugs. SAHM  Allergies:  Allergies  Allergen Reactions  . Hydrocodone-Acetaminophen Itching and Nausea And Vomiting    And dizziness  Codeine, Macrobid, Phenergan  Meds: Camila, Zofran, Zoloft   Review of Systems  Constitutional: Negative.   HENT: Negative.   Eyes: Negative.   Respiratory: Negative.   Cardiovascular: Negative.   Gastrointestinal:       Pelvic pain, intermittent  Genitourinary: Negative.   Musculoskeletal: Negative.  Skin: Negative.   Neurological: Negative.   Psychiatric/Behavioral: Negative.     Last menstrual period 01/08/2014. Physical Exam  Constitutional: She is oriented to person, place, and time. She appears well-developed and well-nourished.  HENT:  Head: Normocephalic and atraumatic.  Cardiovascular: Normal rate and regular rhythm.   Respiratory: Effort normal and breath sounds normal. No respiratory distress. She has no wheezes.  GI: Soft. Bowel sounds are normal. She exhibits no distension. There is no tenderness.  Musculoskeletal: Normal range of motion.  Neurological: She is alert and oriented to person, place, and time.  Skin: Skin is warm and dry.  Psychiatric: She has a  normal mood and affect. Her behavior is normal.   Nl TSH [place] L ovary - 1.3 x 1.5  cm echofree, also avascular 1.12 x 1.3, appears to be resolving HCL R ovary 3 x 2.5 x 2.5; 2 x 2cm. Some endometrioma - homogenous low-level echos, avascular - some excresences Nl appearing cuff  Assessment/Plan: [id number] with R ovary with endometriomas, L ovary with benign appearing cysts For RSO, poss L ovarian cystectomy Pt voices understanding may need future surgery on L ovary D/w pt r/b/a of laparoscopic USO, voices understanding - will proceed.    Bovard-Stuckert, Blythe Veach 08/04/2015, 12:47 PM

## 2015-08-04 NOTE — Anesthesia Preprocedure Evaluation (Addendum)
Anesthesia Evaluation  Patient identified by MRN, date of birth, ID band Patient awake    Reviewed: Allergy & Precautions, H&P , NPO status , Patient's Chart, lab work & pertinent test results, reviewed documented beta blocker date and time   History of Anesthesia Complications Negative for: history of anesthetic complications  Airway Mallampati: II  TM Distance: >3 FB Neck ROM: full    Dental  (+) Teeth Intact   Pulmonary Current Smoker,    Pulmonary exam normal breath sounds clear to auscultation       Cardiovascular Exercise Tolerance: Good hypertension,  Rhythm:regular Rate:Normal     Neuro/Psych PSYCHIATRIC DISORDERS (depression) Anxiety Depression    GI/Hepatic negative GI ROS, Neg liver ROS,   Endo/Other  negative endocrine ROS  Renal/GU negative Renal ROS  negative genitourinary   Musculoskeletal  (+) Fibromyalgia -, narcotic dependent  Abdominal   Peds  Hematology negative hematology ROS (+)   Anesthesia Other Findings   Reproductive/Obstetrics negative OB ROS                           Anesthesia Physical  Anesthesia Plan  ASA: II  Anesthesia Plan: General   Post-op Pain Management:    Induction: Intravenous  Airway Management Planned: Oral ETT  Additional Equipment:   Intra-op Plan:   Post-operative Plan: Extubation in OR  Informed Consent: I have reviewed the patients History and Physical, chart, labs and discussed the procedure including the risks, benefits and alternatives for the proposed anesthesia with the patient or authorized representative who has indicated his/her understanding and acceptance.   Dental advisory given  Plan Discussed with: CRNA  Anesthesia Plan Comments:         Anesthesia Quick Evaluation

## 2015-08-05 ENCOUNTER — Ambulatory Visit (HOSPITAL_COMMUNITY): Payer: Medicaid Other | Admitting: Anesthesiology

## 2015-08-05 ENCOUNTER — Ambulatory Visit (HOSPITAL_COMMUNITY)
Admission: RE | Admit: 2015-08-05 | Discharge: 2015-08-05 | Disposition: A | Discharge status: Home / Self Care | Payer: Medicaid Other | Source: Ambulatory Visit | Attending: Obstetrics and Gynecology | Admitting: Obstetrics and Gynecology

## 2015-08-05 ENCOUNTER — Encounter (HOSPITAL_COMMUNITY)
Admission: RE | Disposition: A | Discharge status: Home / Self Care | Payer: Self-pay | Source: Ambulatory Visit | Attending: Obstetrics and Gynecology

## 2015-08-05 ENCOUNTER — Encounter (HOSPITAL_COMMUNITY): Payer: Self-pay | Admitting: *Deleted

## 2015-08-05 DIAGNOSIS — F17210 Nicotine dependence, cigarettes, uncomplicated: Secondary | ICD-10-CM | POA: Diagnosis not present

## 2015-08-05 DIAGNOSIS — N801 Endometriosis of ovary: Secondary | ICD-10-CM | POA: Insufficient documentation

## 2015-08-05 DIAGNOSIS — Z9851 Tubal ligation status: Secondary | ICD-10-CM | POA: Diagnosis not present

## 2015-08-05 DIAGNOSIS — R102 Pelvic and perineal pain: Secondary | ICD-10-CM | POA: Insufficient documentation

## 2015-08-05 DIAGNOSIS — N80129 Deep endometriosis of ovary, unspecified ovary: Secondary | ICD-10-CM | POA: Diagnosis present

## 2015-08-05 HISTORY — PX: LAPAROSCOPIC LYSIS OF ADHESIONS: SHX5905

## 2015-08-05 HISTORY — PX: LAPAROSCOPIC OVARIAN CYSTECTOMY: SHX6248

## 2015-08-05 HISTORY — PX: OOPHORECTOMY: SHX6387

## 2015-08-05 HISTORY — DX: Endometriosis of ovary: N80.1

## 2015-08-05 LAB — TYPE AND SCREEN
ABO/RH(D): O POS
ANTIBODY SCREEN: NEGATIVE

## 2015-08-05 LAB — CBC
HEMATOCRIT: 41.8 % (ref 36.0–46.0)
Hemoglobin: 14.6 g/dL (ref 12.0–15.0)
MCH: 31 pg (ref 26.0–34.0)
MCHC: 34.9 g/dL (ref 30.0–36.0)
MCV: 88.7 fL (ref 78.0–100.0)
Platelets: 297 10*3/uL (ref 150–400)
RBC: 4.71 MIL/uL (ref 3.87–5.11)
RDW: 12.6 % (ref 11.5–15.5)
WBC: 10.8 10*3/uL — AB (ref 4.0–10.5)

## 2015-08-05 SURGERY — EXCISION, CYST, OVARY, LAPAROSCOPIC
Anesthesia: General | Site: Abdomen | Laterality: Right

## 2015-08-05 MED ORDER — HYDROMORPHONE HCL 1 MG/ML IJ SOLN
INTRAMUSCULAR | Status: DC
Start: 2015-08-05 — End: 2015-08-05
  Filled 2015-08-05: qty 1

## 2015-08-05 MED ORDER — OXYCODONE-ACETAMINOPHEN 5-325 MG PO TABS
1.0000 | ORAL_TABLET | ORAL | Status: DC | PRN
  Administered 2015-08-05: 1 via ORAL

## 2015-08-05 MED ORDER — ONDANSETRON HCL 4 MG/2ML IJ SOLN
INTRAMUSCULAR | Status: AC
  Filled 2015-08-05: qty 2

## 2015-08-05 MED ORDER — LABETALOL HCL 5 MG/ML IV SOLN
INTRAVENOUS | Status: DC | PRN
  Administered 2015-08-05 (×2): 5 mg via INTRAVENOUS

## 2015-08-05 MED ORDER — KETOROLAC TROMETHAMINE 30 MG/ML IJ SOLN
INTRAMUSCULAR | Status: DC | PRN
  Administered 2015-08-05: 30 mg via INTRAVENOUS

## 2015-08-05 MED ORDER — ROCURONIUM BROMIDE 100 MG/10ML IV SOLN
INTRAVENOUS | Status: DC | PRN
  Administered 2015-08-05 (×2): 10 mg via INTRAVENOUS
  Administered 2015-08-05: 40 mg via INTRAVENOUS

## 2015-08-05 MED ORDER — KETOROLAC TROMETHAMINE 30 MG/ML IJ SOLN
INTRAMUSCULAR | Status: AC
  Filled 2015-08-05: qty 1

## 2015-08-05 MED ORDER — FENTANYL CITRATE (PF) 250 MCG/5ML IJ SOLN
INTRAMUSCULAR | Status: AC
  Filled 2015-08-05: qty 5

## 2015-08-05 MED ORDER — OXYCODONE-ACETAMINOPHEN 5-325 MG PO TABS: 1.0000 | ORAL_TABLET | Freq: Four times a day (QID) | ORAL | Status: DC | PRN

## 2015-08-05 MED ORDER — SCOPOLAMINE 1 MG/3DAYS TD PT72
MEDICATED_PATCH | TRANSDERMAL | Status: AC
  Administered 2015-08-05: 1.5 mg via TRANSDERMAL
  Filled 2015-08-05: qty 1

## 2015-08-05 MED ORDER — LABETALOL HCL 5 MG/ML IV SOLN
INTRAVENOUS | Status: AC
  Filled 2015-08-05: qty 4

## 2015-08-05 MED ORDER — GLYCOPYRROLATE 0.2 MG/ML IJ SOLN
INTRAMUSCULAR | Status: DC | PRN
  Administered 2015-08-05: 0.4 mg via INTRAVENOUS

## 2015-08-05 MED ORDER — BUPIVACAINE HCL (PF) 0.25 % IJ SOLN
INTRAMUSCULAR | Status: AC
  Filled 2015-08-05: qty 30

## 2015-08-05 MED ORDER — MIDAZOLAM HCL 2 MG/2ML IJ SOLN
INTRAMUSCULAR | Status: AC
  Filled 2015-08-05: qty 2

## 2015-08-05 MED ORDER — LACTATED RINGERS IV SOLN
INTRAVENOUS | Status: DC
  Administered 2015-08-05: 08:00:00 via INTRAVENOUS

## 2015-08-05 MED ORDER — PROPOFOL 10 MG/ML IV BOLUS
INTRAVENOUS | Status: AC
  Filled 2015-08-05: qty 20

## 2015-08-05 MED ORDER — GLYCOPYRROLATE 0.2 MG/ML IJ SOLN
INTRAMUSCULAR | Status: AC
  Filled 2015-08-05: qty 1

## 2015-08-05 MED ORDER — MIDAZOLAM HCL 2 MG/2ML IJ SOLN
INTRAMUSCULAR | Status: DC | PRN
  Administered 2015-08-05: 2 mg via INTRAVENOUS

## 2015-08-05 MED ORDER — OXYCODONE-ACETAMINOPHEN 5-325 MG PO TABS
ORAL_TABLET | ORAL | Status: DC
Start: 2015-08-05 — End: 2015-08-05
  Filled 2015-08-05: qty 1

## 2015-08-05 MED ORDER — ONDANSETRON HCL 4 MG/2ML IJ SOLN
INTRAMUSCULAR | Status: DC | PRN
  Administered 2015-08-05: 4 mg via INTRAVENOUS

## 2015-08-05 MED ORDER — PROPOFOL 10 MG/ML IV BOLUS
INTRAVENOUS | Status: DC | PRN
Start: 2015-08-05 — End: 2015-08-05
  Administered 2015-08-05: 200 mg via INTRAVENOUS

## 2015-08-05 MED ORDER — BUPIVACAINE HCL (PF) 0.25 % IJ SOLN
INTRAMUSCULAR | Status: DC | PRN
  Administered 2015-08-05: 20 mL

## 2015-08-05 MED ORDER — LIDOCAINE HCL (CARDIAC) 20 MG/ML IV SOLN
INTRAVENOUS | Status: DC | PRN
  Administered 2015-08-05: 60 mg via INTRAVENOUS

## 2015-08-05 MED ORDER — NEOSTIGMINE METHYLSULFATE 10 MG/10ML IV SOLN
INTRAVENOUS | Status: AC
  Filled 2015-08-05: qty 1

## 2015-08-05 MED ORDER — LIDOCAINE HCL (CARDIAC) 20 MG/ML IV SOLN
INTRAVENOUS | Status: AC
  Filled 2015-08-05: qty 5

## 2015-08-05 MED ORDER — MEPERIDINE HCL 25 MG/ML IJ SOLN: 6.2500 mg | INTRAMUSCULAR | Status: DC | PRN

## 2015-08-05 MED ORDER — NEOSTIGMINE METHYLSULFATE 10 MG/10ML IV SOLN
INTRAVENOUS | Status: DC | PRN
  Administered 2015-08-05: 2 mg via INTRAVENOUS

## 2015-08-05 MED ORDER — DEXAMETHASONE SODIUM PHOSPHATE 10 MG/ML IJ SOLN
INTRAMUSCULAR | Status: DC | PRN
  Administered 2015-08-05: 4 mg via INTRAVENOUS

## 2015-08-05 MED ORDER — FENTANYL CITRATE (PF) 100 MCG/2ML IJ SOLN
INTRAMUSCULAR | Status: DC | PRN
  Administered 2015-08-05: 100 ug via INTRAVENOUS
  Administered 2015-08-05: 50 ug via INTRAVENOUS
  Administered 2015-08-05 (×2): 25 ug via INTRAVENOUS
  Administered 2015-08-05 (×4): 50 ug via INTRAVENOUS
  Administered 2015-08-05: 100 ug via INTRAVENOUS

## 2015-08-05 MED ORDER — PROMETHAZINE HCL 25 MG/ML IJ SOLN: 6.2500 mg | INTRAMUSCULAR | Status: DC | PRN

## 2015-08-05 MED ORDER — HYDROMORPHONE HCL 1 MG/ML IJ SOLN
0.2500 mg | INTRAMUSCULAR | Status: DC | PRN
  Administered 2015-08-05 (×4): 0.5 mg via INTRAVENOUS

## 2015-08-05 MED ORDER — LACTATED RINGERS IV SOLN
INTRAVENOUS | Status: DC
  Administered 2015-08-05 (×3): via INTRAVENOUS

## 2015-08-05 MED ORDER — HYDROMORPHONE HCL 1 MG/ML IJ SOLN
INTRAMUSCULAR | Status: AC
  Filled 2015-08-05: qty 1

## 2015-08-05 MED ORDER — LACTATED RINGERS IR SOLN
Status: DC | PRN
  Administered 2015-08-05: 3000 mL

## 2015-08-05 MED ORDER — SCOPOLAMINE 1 MG/3DAYS TD PT72
1.0000 | MEDICATED_PATCH | Freq: Once | TRANSDERMAL | Status: DC
  Administered 2015-08-05: 1.5 mg via TRANSDERMAL

## 2015-08-05 MED ORDER — IBUPROFEN 800 MG PO TABS: 800.0000 mg | ORAL_TABLET | Freq: Three times a day (TID) | ORAL | Status: DC | PRN

## 2015-08-05 SURGICAL SUPPLY — 31 items
APPLICATOR ARISTA FLEXITIP XL (MISCELLANEOUS) ×4 IMPLANT
CABLE HIGH FREQUENCY MONO STRZ (ELECTRODE) IMPLANT
CHLORAPREP W/TINT 26ML (MISCELLANEOUS) ×4 IMPLANT
CLOTH BEACON ORANGE TIMEOUT ST (SAFETY) ×4 IMPLANT
DRSG COVADERM PLUS 2X2 (GAUZE/BANDAGES/DRESSINGS) IMPLANT
DRSG OPSITE POSTOP 3X4 (GAUZE/BANDAGES/DRESSINGS) ×4 IMPLANT
FILTER SMOKE EVAC LAPAROSHD (FILTER) ×4 IMPLANT
GAUZE VASELINE 3X9 (GAUZE/BANDAGES/DRESSINGS) ×4 IMPLANT
GLOVE BIO SURGEON STRL SZ 6.5 (GLOVE) ×3 IMPLANT
GLOVE BIO SURGEONS STRL SZ 6.5 (GLOVE) ×1
GLOVE BIOGEL PI IND STRL 7.0 (GLOVE) ×2 IMPLANT
GLOVE BIOGEL PI INDICATOR 7.0 (GLOVE) ×2
GOWN STRL REUS W/TWL LRG LVL3 (GOWN DISPOSABLE) ×8 IMPLANT
HEMOSTAT ARISTA ABSORB 3G PWDR (MISCELLANEOUS) ×4 IMPLANT
LIQUID BAND (GAUZE/BANDAGES/DRESSINGS) ×4 IMPLANT
NEEDLE INSUFFLATION 120MM (ENDOMECHANICALS) ×4 IMPLANT
NS IRRIG 1000ML POUR BTL (IV SOLUTION) ×4 IMPLANT
PACK LAPAROSCOPY BASIN (CUSTOM PROCEDURE TRAY) ×4 IMPLANT
PAD TRENDELENBURG POSITION (MISCELLANEOUS) ×4 IMPLANT
POUCH SPECIMEN RETRIEVAL 10MM (ENDOMECHANICALS) ×8 IMPLANT
SET IRRIG TUBING LAPAROSCOPIC (IRRIGATION / IRRIGATOR) ×4 IMPLANT
SHEARS HARMONIC ACE PLUS 36CM (ENDOMECHANICALS) ×4 IMPLANT
SLEEVE XCEL OPT CAN 5 100 (ENDOMECHANICALS) ×4 IMPLANT
SUT VICRYL 0 UR6 27IN ABS (SUTURE) ×4 IMPLANT
SUT VICRYL 4-0 PS2 18IN ABS (SUTURE) ×4 IMPLANT
TOWEL OR 17X24 6PK STRL BLUE (TOWEL DISPOSABLE) ×8 IMPLANT
TRAY FOLEY CATH SILVER 14FR (SET/KITS/TRAYS/PACK) ×4 IMPLANT
TROCAR XCEL NON-BLD 11X100MML (ENDOMECHANICALS) ×4 IMPLANT
TROCAR XCEL NON-BLD 5MMX100MML (ENDOMECHANICALS) ×4 IMPLANT
WARMER LAPAROSCOPE (MISCELLANEOUS) ×4 IMPLANT
WATER STERILE IRR 1000ML POUR (IV SOLUTION) IMPLANT

## 2015-08-05 NOTE — Brief Op Note (Signed)
08/05/2015  11:07 AM  PATIENT:  [name]  40 y.o. female  PRE-OPERATIVE DIAGNOSIS:  Multiple Endometriomas R ovary  POST-OPERATIVE DIAGNOSIS:  Multiple Endometriomas R ovary, severe adhesion  PROCEDURE:  Procedure(s): LAPAROSCOPY OOPHORECTOMY (Right) LAPAROSCOPIC LYSIS OF ADHESIONS  SURGEON:  Surgeon(s) and Role:    * Janyth Contes, MD - Primary  ANESTHESIA:   local and general  EBL:  Total I/O In: 2400 [I.V.:2400] Out: 300 [Urine:200; Blood:100]  BLOOD ADMINISTERED:none  DRAINS: none   LOCAL MEDICATIONS USED:  MARCAINE     SPECIMEN:  Source of Specimen:  R ovary  DISPOSITION OF SPECIMEN:  PATHOLOGY  COUNTS:  YES  TOURNIQUET:  * No tourniquets in log *  DICTATION: .Other Dictation: Dictation Number [id number]  PLAN OF CARE: Discharge to home after PACU  PATIENT DISPOSITION:  PACU - hemodynamically stable.   Delay start of Pharmacological VTE agent (>24hrs) due to surgical blood loss or risk of bleeding: not applicable

## 2015-08-05 NOTE — Transfer of Care (Signed)
Immediate Anesthesia Transfer of Care Note  Patient: [name]  Procedure(s) Performed: Procedure(s): LAPAROSCOPY OOPHORECTOMY (Right) LAPAROSCOPIC LYSIS OF ADHESIONS  Patient Location: PACU  Anesthesia Type:General  Level of Consciousness: sedated  Airway & Oxygen Therapy: Patient Spontanous Breathing  Post-op Assessment: Report given to RN  Post vital signs: Reviewed and stable  Last Vitals:  Filed Vitals:   08/05/15 0726 08/05/15 0752  BP:  148/105  Pulse: 84   Temp: 36.9 C   Resp: 18     Complications: No apparent anesthesia complications

## 2015-08-05 NOTE — Anesthesia Postprocedure Evaluation (Signed)
Anesthesia Post Note  Patient: [name]  Procedure(s) Performed: Procedure(s) (LRB): LAPAROSCOPY OOPHORECTOMY (Right) LAPAROSCOPIC LYSIS OF ADHESIONS  Patient location during evaluation: PACU Anesthesia Type: General Level of consciousness: awake Pain management: pain level controlled Vital Signs Assessment: post-procedure vital signs reviewed and stable Respiratory status: spontaneous breathing Cardiovascular status: stable Postop Assessment: no signs of nausea or vomiting Anesthetic complications: no    Last Vitals:  Filed Vitals:   08/05/15 1145 08/05/15 1200  BP: 127/80 125/78  Pulse: 70 72  Temp:    Resp: 12 12    Last Pain:  Filed Vitals:   08/05/15 1213  PainSc: [place]

## 2015-08-05 NOTE — Discharge Instructions (Addendum)
DISCHARGE INSTRUCTIONS: Laparoscopy  The following instructions have been prepared to help you care for yourself upon your return home today.  Wound care:  Do not get the incision wet for the first 24 hours. The incision should be kept clean and dry.  The Band-Aids or dressings may be removed the day after surgery.  Should the incision become sore, red, and swollen after the first week, check with your doctor.  Personal hygiene:  Shower the day after your procedure.  Activity and limitations:  Do NOT drive or operate any equipment today.  Do NOT lift anything more than 15 pounds for 2-3 weeks after surgery.  Do NOT rest in bed all day.  Walking is encouraged. Walk each day, starting slowly with 5-minute walks 3 or 4 times a day. Slowly increase the length of your walks.  Walk up and down stairs slowly.  Do NOT do strenuous activities, such as golfing, playing tennis, bowling, running, biking, weight lifting, gardening, mowing, or vacuuming for 2-4 weeks. Ask your doctor when it is okay to start.  Diet: Eat a light meal as desired this evening. You may resume your usual diet tomorrow.  Return to work: This is dependent on the type of work you do. For the most part you can return to a desk job within a week of surgery. If you are more active at work, please discuss this with your doctor.  What to expect after your surgery: You may have a slight burning sensation when you urinate on the first day. You may have a very small amount of blood in the urine. Expect to have a small amount of vaginal discharge/light bleeding for 1-2 weeks. It is not unusual to have abdominal soreness and bruising for up to 2 weeks. You may be tired and need more rest for about 1 week. You may experience shoulder pain for 24-72 hours. Lying flat in bed may relieve it.  Call your doctor for any of the following:  Develop a fever of 100.4 or greater  Inability to urinate 6 hours after discharge from  hospital  Severe pain not relieved by pain medications  Persistent of heavy bleeding at incision site  Redness or swelling around incision site after a week  Increasing nausea or vomiting  NO IBUPROFEN PRODUCTS UNTIL 4:45 PM  Patient Signature________________________________________ Nurse Signature_________________________________________ Post Anesthesia Home Care Instructions  Activity: Get plenty of rest for the remainder of the day. A responsible adult should stay with you for 24 hours following the procedure.  For the next 24 hours, DO NOT: -Drive a car -Paediatric nurse -Drink alcoholic beverages -Take any medication unless instructed by your physician -Make any legal decisions or sign important papers.  Meals: Start with liquid foods such as gelatin or soup. Progress to regular foods as tolerated. Avoid greasy, spicy, heavy foods. If nausea and/or vomiting occur, drink only clear liquids until the nausea and/or vomiting subsides. Call your physician if vomiting continues.  Special Instructions/Symptoms: Your throat may feel dry or sore from the anesthesia or the breathing tube placed in your throat during surgery. If this causes discomfort, gargle with warm salt water. The discomfort should disappear within 24 hours.  If you had a scopolamine patch placed behind your ear for the management of post- operative nausea and/or vomiting:  1. The medication in the patch is effective for 72 hours, after which it should be removed.  Wrap patch in a tissue and discard in the trash. Wash hands thoroughly with soap and  include dry mouth, dizziness or visual disturbances. 3. Avoid touching the patch. Wash your hands with soap and water after contact with the patch.      

## 2015-08-05 NOTE — Anesthesia Procedure Notes (Signed)
Procedure Name: Intubation Date/Time: 08/05/2015 8:32 AM Performed by: Casimer Lanius A Pre-anesthesia Checklist: Patient identified, Emergency Drugs available, Suction available and Patient being monitored Patient Re-evaluated:Patient Re-evaluated prior to inductionOxygen Delivery Method: Circle system utilized and Simple face mask Preoxygenation: Pre-oxygenation with 100% oxygen Intubation Type: IV induction and Inhalational induction Ventilation: Mask ventilation without difficulty Laryngoscope Size: Mac and 3 Grade View: Grade II Tube type: Oral Tube size: 7.0 mm Number of attempts: 1 (by germeroth) Airway Equipment and Method: Stylet Placement Confirmation: ETT inserted through vocal cords under direct vision,  positive ETCO2 and breath sounds checked- equal and bilateral Secured at: 20 (right lip) cm Tube secured with: Tape Dental Injury: Teeth and Oropharynx as per pre-operative assessment

## 2015-08-05 NOTE — OR Nursing (Signed)
Patients husband updated on surgical and progress and patient condition

## 2015-08-05 NOTE — Interval H&P Note (Signed)
History and Physical Interval Note:  08/05/2015 8:17 AM  [name]  has presented today for surgery, with the diagnosis of Multiple Endometriomas  The various methods of treatment have been discussed with the patient and family. After consideration of risks, benefits and other options for treatment, the patient has consented to  Procedure(s): LAPAROSCOPIC BILATERAL  OVARIAN CYSTECTOMY (Bilateral) OOPHORECTOMY (Right) as a surgical intervention .  The patient's history has been reviewed, patient examined, no change in status, stable for surgery.  I have reviewed the patient's chart and labs.  Questions were answered to the patient's satisfaction.     Bovard-Stuckert, Haider Hornaday

## 2015-08-06 ENCOUNTER — Encounter (HOSPITAL_COMMUNITY): Payer: Self-pay | Admitting: Obstetrics and Gynecology

## 2015-08-06 NOTE — Op Note (Signed)
NAME:  [name], PASK NO.:  [id number]  MEDICAL RECORD NO.:  [id number]  LOCATION:  [facility]                          FACILITY:  [facility]  PHYSICIAN:  Thornell Sartorius, MD        DATE OF BIRTH:  09/21/1975  DATE OF PROCEDURE:  08/05/2015 DATE OF DISCHARGE:                              OPERATIVE REPORT   PREOPERATIVE DIAGNOSIS:  Pelvic pain, multiple endometriomas on the right ovary.  POSTOPERATIVE DIAGNOSIS:  Pelvic pain, multiple endometriomas on the right ovary, dense adhesions from the ovary to the cuff and peritoneal side wall.  PROCEDURE:  Laparoscopic right oophorectomy, extensive laparoscopic lysis of adhesions.  SURGEON:  Thornell Sartorius, MD.  Assistant: Gaylord Shih RNFA  ANESTHESIA:  Local and general anesthesia.  IV FLUIDS:  2400 mL.  URINE OUTPUT:  200 mL.  Clear urine at the end of the procedure.  EBL:  Approximately 100 mL.  COMPLICATIONS:  Extensive/Dense adhesions.  PATHOLOGY:  Right ovary to pathology.  PROCEDURE:  After informed consent was reviewed with the patient and her husband, she was transported to the OR, placed on the OR in supine position, and then in the Yellofins stirrups.  She was anesthetized appropriately and then prepped and draped in the normal sterile fashion.  A sponge stick was placed in her vagina.  As she had previously had a hysterectomy.  At her umbilicus, an approximately 1 cm vertical infraumbilical incision was made using a Veress needle and pneumoperitoneum was obtained.  The trocar was then placed with direct visualization.  Accessory ports were placed in the right and left with direct visualization.  It was noted that her left ovary had a cysts which was noted on the ultrasound had been reviewed and with the ultrasound tech and they look to be resolving follicular cysts and the decision had been made prior to the surgery not to operate on this ovary.  The patient is well aware that she might need a future surgery.   The right ovary was identified and noted to be densely adhesed to the cuff and right side wall. The uterine ovarian pedicle was identified. This was incised and the uterus was peeled from the cuff using Harmonic Scalpel as well as bipolar as well as blunt dissection.  It was removed in an EndoCatch bag through the 11 port at the umbilicus.  It was morcellated in the bag and removed in its entirety.  The cuff was noted to have diffuse oozing, nothing particular but needed to be stopped.  As the ovary was removed, the oozing was noted.  Arixtra was applied to the cuff area where it had been peeled.  It was noted to be hemostatic.  The gas was evacuated from the abdomen.  The 10 mm infraumbilical incision was closed with a deep suture of 0 Vicryl.  The subcu tissue was also closed, the skin was closed with 4-0 Vicryl in a subcuticular fashion as were the accessory ports.  Dermabond was then applied.  Prior to closing, the right ureter was identified and noted to be in the normal location and peristalsis was also noted.  The patient tolerated the procedure well.  Sponge,  lap, and needle counts were correct x2 per the operating staff.     Thornell Sartorius, MD     JB/MEDQ  D:  08/05/2015  T:  08/05/2015  Job:  [id number]

## 2015-08-16 ENCOUNTER — Emergency Department (INDEPENDENT_AMBULATORY_CARE_PROVIDER_SITE_OTHER)
Admission: EM | Admit: 2015-08-16 | Discharge: 2015-08-16 | Disposition: A | Discharge status: Home / Self Care | Payer: Medicaid Other | Source: Home / Self Care | Attending: Emergency Medicine | Admitting: Emergency Medicine

## 2015-08-16 ENCOUNTER — Encounter (HOSPITAL_COMMUNITY): Payer: Self-pay | Admitting: Emergency Medicine

## 2015-08-16 DIAGNOSIS — Z8659 Personal history of other mental and behavioral disorders: Secondary | ICD-10-CM

## 2015-08-16 DIAGNOSIS — L509 Urticaria, unspecified: Secondary | ICD-10-CM | POA: Diagnosis not present

## 2015-08-16 DIAGNOSIS — L299 Pruritus, unspecified: Secondary | ICD-10-CM

## 2015-08-16 MED ORDER — METHYLPREDNISOLONE ACETATE 80 MG/ML IJ SUSP
INTRAMUSCULAR | Status: AC
  Filled 2015-08-16: qty 1

## 2015-08-16 MED ORDER — LORATADINE 10 MG PO TABS: 10.0000 mg | ORAL_TABLET | Freq: Every day | ORAL | Status: DC

## 2015-08-16 MED ORDER — CLONAZEPAM 0.5 MG PO TABS: 0.5000 mg | ORAL_TABLET | Freq: Two times a day (BID) | ORAL | Status: DC | PRN

## 2015-08-16 MED ORDER — METHYLPREDNISOLONE ACETATE 80 MG/ML IJ SUSP
80.0000 mg | Freq: Once | INTRAMUSCULAR | Status: AC
  Administered 2015-08-16: 80 mg via INTRAMUSCULAR

## 2015-08-16 NOTE — Discharge Instructions (Signed)
Hives Hives are itchy, red, swollen areas of the skin. They can vary in size and location on your body. Hives can come and go for hours or several days (acute hives) or for several weeks (chronic hives). Hives do not spread from person to person (noncontagious). They may get worse with scratching, exercise, and emotional stress. CAUSES   Allergic reaction to food, additives, or drugs.  Infections, including the common cold.  Illness, such as vasculitis, lupus, or thyroid disease.  Exposure to sunlight, heat, or cold.  Exercise.  Stress.  Contact with chemicals. SYMPTOMS   Red or white swollen patches on the skin. The patches may change size, shape, and location quickly and repeatedly.  Itching.  Swelling of the hands, feet, and face. This may occur if hives develop deeper in the skin. DIAGNOSIS  Your caregiver can usually tell what is wrong by performing a physical exam. Skin or blood tests may also be done to determine the cause of your hives. In some cases, the cause cannot be determined. TREATMENT  Mild cases usually get better with medicines such as antihistamines. Severe cases may require an emergency epinephrine injection. If the cause of your hives is known, treatment includes avoiding that trigger.  HOME CARE INSTRUCTIONS   Avoid causes that trigger your hives.  Take antihistamines as directed by your caregiver to reduce the severity of your hives. Non-sedating or low-sedating antihistamines are usually recommended. Do not drive while taking an antihistamine.  Take any other medicines prescribed for itching as directed by your caregiver.  Wear loose-fitting clothing.  Keep all follow-up appointments as directed by your caregiver. SEEK MEDICAL CARE IF:   You have persistent or severe itching that is not relieved with medicine.  You have painful or swollen joints. SEEK IMMEDIATE MEDICAL CARE IF:   You have a fever.  Your tongue or lips are swollen.  You have  trouble breathing or swallowing.  You feel tightness in the throat or chest.  You have abdominal pain. These problems may be the first sign of a life-threatening allergic reaction. Call your local emergency services (911 in [place] MAKE SURE YOU:   Understand these instructions.  Will watch your condition.  Will get help right away if you are not doing well or get worse.   This information is not intended to replace advice given to you by your health care provider. Make sure you discuss any questions you have with your health care provider.   You have hives that have not responded well to Benadryl or Hydroxyzine. Will change to Claritin daily, and you were given DepoMedrol injection (steroid) here. You are aware this can heighten any anxiety so given Klonopin for bedtime only until f/u with you regular physician on Thursday. If you feel that you are worsening then please go to the ED   Document Released: 06/06/2005 Document Revised: 06/11/2013 Document Reviewed: 08/30/2011 Elsevier Interactive Patient Education [employer].

## 2015-08-16 NOTE — ED Provider Notes (Signed)
CSN: [id number]     Arrival date & time 08/16/15  1303 History   First MD Initiated Contact with Patient 08/16/15 1316     Chief Complaint  Patient presents with  . Urticaria   (Consider location/radiation/quality/duration/timing/severity/associated sxs/prior Treatment) HPI Comments: Patient presents with a pruritic rash x 3-4 days. She had a hysterectomy 2 weeks ago without complications. She reports this rash began 8 days following surgery. It is "driving her crazy". She call the OB who called in hydroxyzine and she is using this with Benadryl and reports it is getting worse. She denies any exposures or new changes in food.  She has no chest pain, throat swelling or SOB, but feels that her anxiety is returning because of the itching that is keeping her awake. She also reports that oatmeal baths have not been helpful. This occurred in 2015 and last 1-2 weeks after that surgery, but eventually improved.   Patient is a 40 y.o. female presenting with urticaria. The history is provided by the patient.  Urticaria Pertinent negatives include no abdominal pain.    Past Medical History  Diagnosis Date  . Fibroids   . History of preterm labor   . History of recurrent UTI (urinary tract infection)   . Uterine fibroids affecting pregnancy   . Obese   . Polyhydramnios 09/20/2011  . S/P cesarean section 09/20/2011  . Encounter for female sterilization procedure 12/06/2011  . S/P tubal ligation 12/07/2011  . SVD (spontaneous vaginal delivery)     x 2  . Mild preeclampsia 09/20/2011    Hix with 2013 pregnancy  PIH - resolved, no longer an issue  . Depression     no meds  . Anxiety     no meds  . Fibromyalgia     no meds  . Intramural leiomyoma of uterus 01/21/2014  . Adenomyosis 01/21/2014  . Abnormal uterine bleeding (AUB) 01/21/2014  . Pelvic pain in female 01/21/2014  . Endometrioma of ovary 08/04/2015  . Vaginal delivery 1995, 1998   Past Surgical History  Procedure Laterality Date  . Knee surgery       right - dislocated knee cap  . Cesarean section  09/20/2011    Procedure: CESAREAN SECTION;  Surgeon: Thornell Sartorius, MD;  Location: [facility];  Service: Gynecology;  Laterality: N/A;  . Laparoscopic tubal ligation  12/07/2011    Procedure: LAPAROSCOPIC TUBAL LIGATION;  Surgeon: Thornell Sartorius, MD;  Location: [facility];  Service: Gynecology;  Laterality: Bilateral;  . Cholecystectomy      Lap  . Laparoscopic assisted vaginal hysterectomy N/A 01/22/2014    Procedure: LAPAROSCOPIC ASSISTED VAGINAL HYSTERECTOMY, Bilateral Salpingectomy;  Surgeon: Janyth Contes, MD;  Location: [facility];  Service: Gynecology;  Laterality: N/A;  . Laparoscopic ovarian cystectomy  08/05/2015    Procedure: LAPAROSCOPY;  Surgeon: Janyth Contes, MD;  Location: [facility];  Service: Gynecology;;  . Oophorectomy Right 08/05/2015    Procedure: OOPHORECTOMY;  Surgeon: Janyth Contes, MD;  Location: [facility];  Service: Gynecology;  Laterality: Right;  . Laparoscopic lysis of adhesions  08/05/2015    Procedure: LAPAROSCOPIC LYSIS OF ADHESIONS;  Surgeon: Janyth Contes, MD;  Location: [facility];  Service: Gynecology;;   Family History  Problem Relation Age of Onset  . Cancer Mother     breast  . Hypertension Mother   . Heart disease Father   . Hypertension Father   . Asthma Sister   . Mental illness Sister     bipolar, schizophrenia,   . Ovarian cysts Sister   .  Seizures Sister   . Diabetes Maternal Grandmother   . Depression Maternal Grandfather     suicide   Social History  Substance Use Topics  . Smoking status: Current Some Day Smoker -- 1.00 packs/day    Types: Cigarettes  . Smokeless tobacco: Never Used  . Alcohol Use: Yes     Comment: Socially   OB History    Gravida Para Term Preterm AB TAB SAB Ectopic Multiple Living   4 3 1 2 1  0 1 0 0 3     Review of Systems  Constitutional: Negative for fever and fatigue.  Respiratory: Negative.   Gastrointestinal: Negative for abdominal pain.   Genitourinary: Negative for flank pain and pelvic pain.  Allergic/Immunologic: Negative.   Neurological: Negative.   Psychiatric/Behavioral:       Known history of anxiety on Zoloft    Allergies  Hydrocodone-acetaminophen  Home Medications   Prior to Admission medications   Medication Sig Start Date End Date Taking? Authorizing Provider  clonazePAM (KLONOPIN) 0.5 MG tablet Take 1 tablet (0.5 mg total) by mouth 2 (two) times daily as needed for anxiety. 08/16/15   Bjorn Pippin, PA-C  ibuprofen (ADVIL,MOTRIN) 800 MG tablet Take 1 tablet (800 mg total) by mouth every 8 (eight) hours as needed for fever or moderate pain (mild pain). 08/05/15   Janyth Contes, MD  loratadine (CLARITIN) 10 MG tablet Take 1 tablet (10 mg total) by mouth daily. 08/16/15   Bjorn Pippin, PA-C  Multiple Vitamin (MULTIVITAMIN WITH MINERALS) TABS tablet Take 1 tablet by mouth daily.    Historical Provider, MD  ondansetron (ZOFRAN) 4 MG tablet Take 4 mg by mouth every 8 (eight) hours as needed for nausea or vomiting.  06/17/15   Historical Provider, MD  oxyCODONE-acetaminophen (PERCOCET/ROXICET) 5-325 MG tablet Take 1-2 tablets by mouth every 6 (six) hours as needed for moderate pain or severe pain. 08/05/15   Janyth Contes, MD  pravastatin (PRAVACHOL) 40 MG tablet Take 1 tablet (40 mg total) by mouth daily. Patient not taking: Reported on 07/24/2015 07/06/15   Micheline Chapman, NP  sertraline (ZOLOFT) 50 MG tablet Take 50 mg by mouth every evening.    Historical Provider, MD   Meds Ordered and Administered this Visit   Medications  methylPREDNISolone acetate (DEPO-MEDROL) injection 80 mg (80 mg Intramuscular Given 08/16/15 1407)    BP 145/88 mmHg  Pulse 97  Temp(Src) 98.4 F (36.9 C) (Oral)  Resp 18  SpO2 98%  LMP 01/08/2014 No data found.   Physical Exam  Constitutional: She is oriented to person, place, and time. She appears well-developed and well-nourished.  Patient is non-toxic  appearing though somewhat anxious and scratching on exam table.  HENT:  Mouth/Throat: Oropharynx is clear and moist.  Neck: Normal range of motion. No tracheal deviation present.  Cardiovascular: Normal rate and regular rhythm.   Pulmonary/Chest: Effort normal and breath sounds normal.  Neurological: She is alert and oriented to person, place, and time.  Skin: Skin is warm and dry.  Thick hives along trunk, extremities and face  Psychiatric: Her behavior is normal.  Vitals reviewed.   ED Course  Procedures (including critical care time)  Labs Review Labs Reviewed - No data to display  Imaging Review No results found.   Visual Acuity Review  Right Eye Distance:   Left Eye Distance:   Bilateral Distance:    Right Eye Near:   Left Eye Near:    Bilateral Near:  MDM   1. Urticaria   2. History of anxiety disorder   3. Itching   1. Non-responsive to Benadryl and Hydroxyzine. Exam with significant hives. Elected to treat with 80mg  of DepoMedrol however with her anxiousness we discussed could get worse. She would like to proceed. I did provide 10 Klonopin (she has taken before) if needed for  anxiety until she can f/u with her physician on Thursday. No emergent needs today and reassurance is given. If she worsens suggest to go to the ED.      Bjorn Pippin, PA-C 08/16/15 1437

## 2015-08-16 NOTE — ED Notes (Signed)
Pt here with worsening urticaria post surgery 2 weeks ago States she broke in rash all over 8 days after surgery Prior surgery in 2015 rash occurred  Denies new medications or food allergy  Bilateral soles burning with itching Taking Benadryl and oatmeal baths with some relief

## 2015-09-14 ENCOUNTER — Ambulatory Visit (INDEPENDENT_AMBULATORY_CARE_PROVIDER_SITE_OTHER): Payer: Medicaid Other | Admitting: Family Medicine

## 2015-09-14 ENCOUNTER — Encounter: Payer: Self-pay | Admitting: Family Medicine

## 2015-09-14 VITALS — BP 144/98 | HR 102 | Temp 98.0°F | Ht 68.5 in | Wt 208.0 lb

## 2015-09-14 DIAGNOSIS — L509 Urticaria, unspecified: Secondary | ICD-10-CM | POA: Diagnosis not present

## 2015-09-14 MED ORDER — DIAZEPAM 5 MG PO TABS: 5.0000 mg | ORAL_TABLET | Freq: Four times a day (QID) | ORAL | Status: DC | PRN

## 2015-09-14 MED ORDER — PREDNISONE 20 MG PO TABS: ORAL_TABLET | ORAL | Status: DC

## 2015-09-14 NOTE — Progress Notes (Signed)
Patient ID: [name], female   DOB: Sep 25, 1975, 40 y.o.   MRN: [id number]   [name], is a 40 y.o. female  [id number]  [id number]  DOB - 02-27-1976  CC:  Chief Complaint  Patient presents with  . Rash    feb 15th right ovary removed Dr. Melba Coon, 8 days after surgery patinet started breaking out in hives, she thought maybe anxiety induced, but she also called in rx atarax, which has not helped, has had steroids from urgent care and this past weekend has terrible hives and swelling across lips, face, legs, abdomen, no relief, until she took her moms xanax and all symptoms resolved, needs referrals from [place] to evaluate        HPI: [name] is a 40 y.o. female here with hives. She had surgery on 2/25 and about 8 days later began to have itching and hives. She reports she had an episode like this here she was 67 and a 2nd time after surgery in 2015. She reports she is not on new medications. She is aware of any new products. She reports itching of throat and eyes, swelling of her lips and a generalized rash on her trunk, arms and legs. She would like a referral to dermatology. She has daignosed anxiety and is on Zoloft for depression and anxiety. She has been prescribed a small prescription of Klonipin, which she says has not helped. She took some of her mothers Valium and that did help her sleep. She reports the symptoms wax and wanes and she had not detected a pattern or precipitating factor. She denies fever or chills.  Allergies  Allergen Reactions  . Hydrocodone-Acetaminophen Itching and Nausea And Vomiting    And dizziness   Past Medical History  Diagnosis Date  . Fibroids   . History of preterm labor   . History of recurrent UTI (urinary tract infection)   . Uterine fibroids affecting pregnancy   . Obese   . Polyhydramnios 09/20/2011  . S/P cesarean section 09/20/2011  . Encounter for female sterilization procedure 12/06/2011  . S/P tubal ligation 12/07/2011  . SVD  (spontaneous vaginal delivery)     x 2  . Mild preeclampsia 09/20/2011    Hix with 2013 pregnancy  PIH - resolved, no longer an issue  . Depression     no meds  . Anxiety     no meds  . Fibromyalgia     no meds  . Intramural leiomyoma of uterus 01/21/2014  . Adenomyosis 01/21/2014  . Abnormal uterine bleeding (AUB) 01/21/2014  . Pelvic pain in female 01/21/2014  . Endometrioma of ovary 08/04/2015  . Vaginal delivery 1995, 1998   Current Outpatient Prescriptions on File Prior to Visit  Medication Sig Dispense Refill  . Multiple Vitamin (MULTIVITAMIN WITH MINERALS) TABS tablet Take 1 tablet by mouth daily.    . sertraline (ZOLOFT) 50 MG tablet Take 50 mg by mouth every evening.    Marland Kitchen ibuprofen (ADVIL,MOTRIN) 800 MG tablet Take 1 tablet (800 mg total) by mouth every 8 (eight) hours as needed for fever or moderate pain (mild pain). (Patient not taking: Reported on 09/14/2015) 45 tablet 1  . loratadine (CLARITIN) 10 MG tablet Take 1 tablet (10 mg total) by mouth daily. (Patient not taking: Reported on 09/14/2015) 30 tablet 0  . ondansetron (ZOFRAN) 4 MG tablet Take 4 mg by mouth every 8 (eight) hours as needed for nausea or vomiting. Reported on 09/14/2015  0  . oxyCODONE-acetaminophen (PERCOCET/ROXICET) 5-325 MG  tablet Take 1-2 tablets by mouth every 6 (six) hours as needed for moderate pain or severe pain. (Patient not taking: Reported on 09/14/2015) 30 tablet 0  . pravastatin (PRAVACHOL) 40 MG tablet Take 1 tablet (40 mg total) by mouth daily. (Patient not taking: Reported on 07/24/2015) 90 tablet 1   No current facility-administered medications on file prior to visit.   Family History  Problem Relation Age of Onset  . Cancer Mother     breast  . Hypertension Mother   . Heart disease Father   . Hypertension Father   . Asthma Sister   . Mental illness Sister     bipolar, schizophrenia,   . Ovarian cysts Sister   . Seizures Sister   . Diabetes Maternal Grandmother   . Depression Maternal  Grandfather     suicide   Social History   Social History  . Marital Status: Single    Spouse Name: N/A  . Number of Children: N/A  . Years of Education: N/A   Occupational History  . Not on file.   Social History Main Topics  . Smoking status: Current Some Day Smoker -- 1.00 packs/day    Types: Cigarettes  . Smokeless tobacco: Never Used  . Alcohol Use: Yes     Comment: Socially  . Drug Use: No  . Sexual Activity: Yes    Birth Control/ Protection: Surgical   Other Topics Concern  . Not on file   Social History Narrative    Review of Systems: Constitutional: Negative for fever, chills, appetite change, weight loss,  Fatigue. Skin: Positive for rashes or lesions of concern. HENT: Negative for ear pain, ear discharge.nose bleeds. Positive for itching throat Eyes: Negative for pain, discharge, redness, and visual disturbance.Positive for itching Neck: Negative for pain, stiffness Respiratory: Negative for cough, shortness of breath,   Cardiovascular: Negative for chest pain, palpitations and leg swelling. Gastrointestinal: Negative for abdominal pain, nausea, vomiting, diarrhea, constipations Genitourinary: Negative for dysuria, urgency, frequency, hematuria,  Musculoskeletal: Negative for back pain, joint pain, joint  swelling, and gait problem.Negative for weakness. Neurological: Negative for dizziness, tremors, seizures, syncope,   light-headedness, numbness and headaches.  Hematological: Negative for easy bruising or bleeding Psychiatric/Behavioral Positive for depression and anxiety:   Objective:   Filed Vitals:   09/14/15 1319  BP: 166/112  Pulse: 102  Temp: 98 F (36.7 C)    Physical Exam: Constitutional: Patient appears well-developed and well-nourished. No distress. HENT: Normocephalic, atraumatic, External right and left ear normal. Oropharynx is clear and moist.  Eyes: Conjunctivae and EOM are normal. PERRLA, no scleral icterus. Neck: Normal ROM.  Neck supple. No lymphadenopathy, No thyromegaly. CVS: RRR, S1/S2 +, no murmurs, no gallops, no rubs Pulmonary: Effort and breath sounds normal, no stridor, rhonchi, wheezes, rales.  Abdominal: Soft. Normoactive BS,, no distension, tenderness, rebound or guarding.  Musculoskeletal: Normal range of motion. No edema and no tenderness.  Neuro: Alert.Normal muscle tone coordination. Non-focal Skin: positive for hives scattered over her Psychiatric: Normal mood and affect. Behavior, judgment, thought content normal.  Lab Results  Component Value Date   WBC 10.8* 08/05/2015   HGB 14.6 08/05/2015   HCT 41.8 08/05/2015   MCV 88.7 08/05/2015   PLT 297 08/05/2015   Lab Results  Component Value Date   CREATININE 0.57 07/02/2015   BUN 12 07/02/2015   NA 139 07/02/2015   K 4.2 07/02/2015   CL 105 07/02/2015   CO2 20 07/02/2015    No results found for: HGBA1C  Lipid Panel     Component Value Date/Time   CHOL 223* 07/02/2015 0900   TRIG 218* 07/02/2015 0900   HDL 35* 07/02/2015 0900   CHOLHDL 6.4* 07/02/2015 0900   VLDL 44* 07/02/2015 0900   LDLCALC 144* 07/02/2015 0900       Assessment and plan:   1. Hives  - predniSONE (DELTASONE) 20 MG tablet; Take 3 a day for 3 days, 2 a day for 3 days, then one a day for 3 days.  Dispense: 18 tablet; Refill: 0 - diazepam (VALIUM) 5 MG tablet; Take 1 tablet (5 mg total) by mouth every 6 (six) hours as needed for anxiety.  Dispense: 10 tablet; Refill: 0 - Ambulatory referral to Allergy   Return if symptoms worsen or fail to improve.  The patient was given clear instructions to go to ER or return to medical center if symptoms don't improve, worsen or new problems develop. The patient verbalized understanding.    Micheline Chapman FNP  09/14/2015, 1:50 PM

## 2015-09-14 NOTE — Patient Instructions (Signed)
I would recommend only taking the Valium at bedtime for rest. I have put in referral to allergist. Some one will call you If you have a severe reaction with throat swelling and/or lips swelling, go to ED/

## 2015-09-24 ENCOUNTER — Encounter (HOSPITAL_COMMUNITY): Payer: Self-pay | Admitting: Emergency Medicine

## 2015-09-24 ENCOUNTER — Emergency Department (HOSPITAL_COMMUNITY)
Admission: EM | Admit: 2015-09-24 | Discharge: 2015-09-24 | Disposition: A | Discharge status: Home / Self Care | Payer: Medicaid Other | Attending: Emergency Medicine | Admitting: Emergency Medicine

## 2015-09-24 DIAGNOSIS — F329 Major depressive disorder, single episode, unspecified: Secondary | ICD-10-CM | POA: Insufficient documentation

## 2015-09-24 DIAGNOSIS — Z87440 Personal history of urinary (tract) infections: Secondary | ICD-10-CM | POA: Insufficient documentation

## 2015-09-24 DIAGNOSIS — E669 Obesity, unspecified: Secondary | ICD-10-CM | POA: Insufficient documentation

## 2015-09-24 DIAGNOSIS — L509 Urticaria, unspecified: Secondary | ICD-10-CM

## 2015-09-24 DIAGNOSIS — F17210 Nicotine dependence, cigarettes, uncomplicated: Secondary | ICD-10-CM | POA: Diagnosis not present

## 2015-09-24 DIAGNOSIS — Z79899 Other long term (current) drug therapy: Secondary | ICD-10-CM | POA: Diagnosis not present

## 2015-09-24 DIAGNOSIS — Z86018 Personal history of other benign neoplasm: Secondary | ICD-10-CM | POA: Diagnosis not present

## 2015-09-24 DIAGNOSIS — F419 Anxiety disorder, unspecified: Secondary | ICD-10-CM | POA: Insufficient documentation

## 2015-09-24 DIAGNOSIS — J069 Acute upper respiratory infection, unspecified: Secondary | ICD-10-CM | POA: Insufficient documentation

## 2015-09-24 DIAGNOSIS — Z8742 Personal history of other diseases of the female genital tract: Secondary | ICD-10-CM | POA: Insufficient documentation

## 2015-09-24 DIAGNOSIS — Z7952 Long term (current) use of systemic steroids: Secondary | ICD-10-CM | POA: Insufficient documentation

## 2015-09-24 DIAGNOSIS — R05 Cough: Secondary | ICD-10-CM | POA: Diagnosis present

## 2015-09-24 MED ORDER — ALBUTEROL SULFATE HFA 108 (90 BASE) MCG/ACT IN AERS
2.0000 | INHALATION_SPRAY | RESPIRATORY_TRACT | Status: DC | PRN
  Administered 2015-09-24: 2 via RESPIRATORY_TRACT
  Filled 2015-09-24: qty 6.7

## 2015-09-24 MED ORDER — PREDNISONE 20 MG PO TABS
60.0000 mg | ORAL_TABLET | Freq: Once | ORAL | Status: AC
  Administered 2015-09-24: 60 mg via ORAL
  Filled 2015-09-24: qty 3

## 2015-09-24 MED ORDER — PREDNISONE 10 MG PO TABS: 20.0000 mg | ORAL_TABLET | Freq: Every day | ORAL | Status: DC

## 2015-09-24 NOTE — ED Notes (Signed)
Pt. reports itchy hives at face and forehead onset yesterday with productive cough , nasal congestion and sore throat . Denies fever or chills. Airway intact / no SOB or oral swelling .

## 2015-09-24 NOTE — ED Provider Notes (Signed)
CSN: [id number]     Arrival date & time 09/24/15  0606 History   First MD Initiated Contact with Patient 09/24/15 [phone]     Chief Complaint  Patient presents with  . Urticaria  . Cough  . Sore Throat     (Consider location/radiation/quality/duration/timing/severity/associated sxs/prior Treatment) HPI This is a 40 year old female who presents today complaining of URI and urticaria. She states that she has had problems with recurrent hives. Each time this occurred after surgery. Her last surgery was in February. She has had multiple episodes of hives since then. She has recently finished a course of prednisone. The hives had resolved until last night when she began having URI symptoms. At that time her hives were recurring on her extremities. She was very itchy. She has been taking Benadryl around the clock. In the early morning hours she felt like her At Her Throat or Itching. She Did Not Have Difficulty Breathing but Felt like She Was Having Some Wheezing. She Now Feels like Those Symptoms Have Improved. Past Medical History  Diagnosis Date  . Fibroids   . History of preterm labor   . History of recurrent UTI (urinary tract infection)   . Uterine fibroids affecting pregnancy   . Obese   . Polyhydramnios 09/20/2011  . S/P cesarean section 09/20/2011  . Encounter for female sterilization procedure 12/06/2011  . S/P tubal ligation 12/07/2011  . SVD (spontaneous vaginal delivery)     x 2  . Mild preeclampsia 09/20/2011    Hix with 2013 pregnancy  PIH - resolved, no longer an issue  . Depression     no meds  . Anxiety     no meds  . Fibromyalgia     no meds  . Intramural leiomyoma of uterus 01/21/2014  . Adenomyosis 01/21/2014  . Abnormal uterine bleeding (AUB) 01/21/2014  . Pelvic pain in female 01/21/2014  . Endometrioma of ovary 08/04/2015  . Vaginal delivery 1995, 1998   Past Surgical History  Procedure Laterality Date  . Knee surgery      right - dislocated knee cap  . Cesarean section   09/20/2011    Procedure: CESAREAN SECTION;  Surgeon: Thornell Sartorius, MD;  Location: [facility];  Service: Gynecology;  Laterality: N/A;  . Laparoscopic tubal ligation  12/07/2011    Procedure: LAPAROSCOPIC TUBAL LIGATION;  Surgeon: Thornell Sartorius, MD;  Location: [facility];  Service: Gynecology;  Laterality: Bilateral;  . Cholecystectomy      Lap  . Laparoscopic assisted vaginal hysterectomy N/A 01/22/2014    Procedure: LAPAROSCOPIC ASSISTED VAGINAL HYSTERECTOMY, Bilateral Salpingectomy;  Surgeon: Janyth Contes, MD;  Location: [facility];  Service: Gynecology;  Laterality: N/A;  . Laparoscopic ovarian cystectomy  08/05/2015    Procedure: LAPAROSCOPY;  Surgeon: Janyth Contes, MD;  Location: [facility];  Service: Gynecology;;  . Oophorectomy Right 08/05/2015    Procedure: OOPHORECTOMY;  Surgeon: Janyth Contes, MD;  Location: [facility];  Service: Gynecology;  Laterality: Right;  . Laparoscopic lysis of adhesions  08/05/2015    Procedure: LAPAROSCOPIC LYSIS OF ADHESIONS;  Surgeon: Janyth Contes, MD;  Location: [facility];  Service: Gynecology;;   Family History  Problem Relation Age of Onset  . Cancer Mother     breast  . Hypertension Mother   . Heart disease Father   . Hypertension Father   . Asthma Sister   . Mental illness Sister     bipolar, schizophrenia,   . Ovarian cysts Sister   . Seizures Sister   . Diabetes Maternal  Grandmother   . Depression Maternal Grandfather     suicide   Social History  Substance Use Topics  . Smoking status: Current Some Day Smoker -- 0.00 packs/day    Types: Cigarettes  . Smokeless tobacco: Never Used  . Alcohol Use: Yes     Comment: Socially   OB History    Gravida Para Term Preterm AB TAB SAB Ectopic Multiple Living   4 3 1 2 1  0 1 0 0 3     Review of Systems  All other systems reviewed and are negative.     Allergies  Hydrocodone-acetaminophen  Home Medications   Prior to Admission medications   Medication Sig Start Date End Date  Taking? Authorizing Provider  diazepam (VALIUM) 5 MG tablet Take 1 tablet (5 mg total) by mouth every 6 (six) hours as needed for anxiety. 09/14/15   Micheline Chapman, NP  ibuprofen (ADVIL,MOTRIN) 800 MG tablet Take 1 tablet (800 mg total) by mouth every 8 (eight) hours as needed for fever or moderate pain (mild pain). Patient not taking: Reported on 09/14/2015 08/05/15   Janyth Contes, MD  loratadine (CLARITIN) 10 MG tablet Take 1 tablet (10 mg total) by mouth daily. Patient not taking: Reported on 09/14/2015 08/16/15   Bjorn Pippin, PA-C  Multiple Vitamin (MULTIVITAMIN WITH MINERALS) TABS tablet Take 1 tablet by mouth daily.    Historical Provider, MD  ondansetron (ZOFRAN) 4 MG tablet Take 4 mg by mouth every 8 (eight) hours as needed for nausea or vomiting. Reported on 09/14/2015 06/17/15   Historical Provider, MD  oxyCODONE-acetaminophen (PERCOCET/ROXICET) 5-325 MG tablet Take 1-2 tablets by mouth every 6 (six) hours as needed for moderate pain or severe pain. Patient not taking: Reported on 09/14/2015 08/05/15   Janyth Contes, MD  pravastatin (PRAVACHOL) 40 MG tablet Take 1 tablet (40 mg total) by mouth daily. Patient not taking: Reported on 07/24/2015 07/06/15   Micheline Chapman, NP  predniSONE (DELTASONE) 20 MG tablet Take 3 a day for 3 days, 2 a day for 3 days, then one a day for 3 days. 09/14/15   Micheline Chapman, NP  sertraline (ZOLOFT) 50 MG tablet Take 50 mg by mouth every evening.    Historical Provider, MD   BP 152/106 mmHg  Pulse 77  Temp(Src) 98.8 F (37.1 C) (Oral)  Resp 16  Ht 5\' 9"  (1.753 m)  Wt 92.987 kg  BMI 30.26 kg/m2  SpO2 96%  LMP 01/08/2014 Physical Exam  Constitutional: She is oriented to person, place, and time. She appears well-developed and well-nourished.  HENT:  Head: Normocephalic and atraumatic.  Right Ear: External ear normal.  Left Ear: External ear normal.  Nose: Nose normal.  Mouth/Throat: Oropharynx is clear and moist.  Oropharynx  clear no evidence of swelling  Eyes: Conjunctivae and EOM are normal. Pupils are equal, round, and reactive to light.  Neck: Normal range of motion. Neck supple.  Cardiovascular: Normal rate, regular rhythm, normal heart sounds and intact distal pulses.   Pulmonary/Chest: Effort normal and breath sounds normal.  Some diffuse basilar rhonchi with scattered expiratory wheezes  Abdominal: Soft. Bowel sounds are normal.  Musculoskeletal: Normal range of motion.  Neurological: She is alert and oriented to person, place, and time. She has normal reflexes.  Skin: Skin is warm and dry. Rash noted.  Scattered excoriated areas consistent with her scratching and some bruises on her medial thighs she states also from scratches she has one area of 3 x 4 cm  hives on the right posterior upper arm.  Psychiatric: She has a normal mood and affect. Her behavior is normal. Judgment and thought content normal.  Nursing note and vitals reviewed.   ED Course  Procedures (including critical care time) Labs Review Labs Reviewed - No data to display  Imaging Review No results found. I have personally reviewed and evaluated these images and lab results as part of my medical decision-making.   EKG Interpretation None      MDM   Final diagnoses:  URI (upper respiratory infection)  Hives   patient given prednisone and observed here. She is also given a albuterol nebulizer. Her lungs are clear. She is started back on a course of prednisone. She is given strict return precautions including any difficulty swallowing or breathing and voices understanding.  Pattricia Boss, MD 09/24/15 [phone]

## 2015-09-29 ENCOUNTER — Encounter: Payer: Self-pay | Admitting: Allergy and Immunology

## 2015-09-29 ENCOUNTER — Ambulatory Visit (INDEPENDENT_AMBULATORY_CARE_PROVIDER_SITE_OTHER): Payer: Medicaid Other | Admitting: Allergy and Immunology

## 2015-09-29 VITALS — BP 140/90 | HR 88 | Temp 98.0°F | Resp 18 | Ht 68.11 in | Wt 206.8 lb

## 2015-09-29 DIAGNOSIS — J3089 Other allergic rhinitis: Secondary | ICD-10-CM | POA: Insufficient documentation

## 2015-09-29 DIAGNOSIS — R062 Wheezing: Secondary | ICD-10-CM | POA: Diagnosis not present

## 2015-09-29 DIAGNOSIS — J4520 Mild intermittent asthma, uncomplicated: Secondary | ICD-10-CM | POA: Diagnosis not present

## 2015-09-29 DIAGNOSIS — T7840XA Allergy, unspecified, initial encounter: Secondary | ICD-10-CM

## 2015-09-29 MED ORDER — EPINEPHRINE 0.3 MG/0.3ML IJ SOAJ: INTRAMUSCULAR | Status: DC

## 2015-09-29 MED ORDER — LEVOCETIRIZINE DIHYDROCHLORIDE 5 MG PO TABS: ORAL_TABLET | ORAL | Status: DC

## 2015-09-29 MED ORDER — PREDNISONE 1 MG PO TABS: 10.0000 mg | ORAL_TABLET | ORAL | Status: DC

## 2015-09-29 MED ORDER — RANITIDINE HCL 150 MG PO TABS: ORAL_TABLET | ORAL | Status: DC

## 2015-09-29 MED ORDER — CETIRIZINE HCL 10 MG PO TABS: ORAL_TABLET | ORAL | Status: DC

## 2015-09-29 NOTE — Assessment & Plan Note (Addendum)
Allergic reaction to latex versus to carry with associated angioedema.  Skin tests today was positive to latex. Meticulous avoidance of latex.  A prescription has been provided for EpiPen 0.3 mg 2 pack along with instructions for its proper administration.  Prednisone has been provided, 20 mg x 4 days, 10 mg x1 day, then stop.  Instructions have been provided and discussed for H1/H2 receptor blockade with step-wise increase/decrease to find lowest effective dose.  Should symptoms recur, a journal is to be kept recording any foods eaten, beverages consumed, medications taken within a 6 hour period prior to the onset of symptoms, as well as record activities being performed, and environmental conditions. For any symptoms concerning for anaphylaxis, epinephrine is to be administered and 911 is to be called immediately.

## 2015-09-29 NOTE — Assessment & Plan Note (Signed)
   Aeroallergen avoidance measures have been discussed and provided in written form.  A prescription has been provided for fluticasone nasal spray, 2 sprays per nostril daily as needed. Proper nasal spray technique has been discussed and demonstrated.  A prescription has been provided for levocetirizine, 5 mg daily as needed.

## 2015-09-29 NOTE — Progress Notes (Addendum)
New Patient Note  RE: [name] MRN: [id number] DOB: [birth year] Date of Office Visit: 09/29/2015  Referring provider: Micheline Chapman, NP Primary care provider: Sharon Seller, NP  Chief Complaint: Allergic Reaction; Urticaria; and Angioedema   History of present illness: HPI Comments: [name] is a 40 y.o. female presenting today for consultation of allergic reactions.  In August 2015, she had a partial hysterectomy in within a week of the procedure she developed generalized urticaria which lasted for 2 weeks.  Her gynecologist believed that the urticaria may have been related to stress due to blood loss and the procedure.  February 15, she had a right-sided oophorectomy approximately one week later she developed generalized urticaria as well as lipid eyelid angioedema requiring 2 trips to the emergency department.  She is uncertain if her throat felt tight or if she was experiencing anxiety.  Her symptoms a been unresponsive to hydroxyzine and loratadine.  Prednisone has suppressed the urticaria and angioedema, however the symptoms returned soon as the prednisone is discontinued. [name] experiences nasal congestion, rhinorrhea, and sneezing.  These nasal symptoms occur year around but tend to be more severe during the springtime.  She experiences coughing, chest tightness, and wheezing, typically with upper respiratory tract infections, and therefore has albuterol HFA for rescue.   Assessment and plan: Allergic reaction Allergic reaction to latex versus to carry with associated angioedema.  Skin tests today was positive to latex. Meticulous avoidance of latex.  A prescription has been provided for EpiPen 0.3 mg 2 pack along with instructions for its proper administration.  Prednisone has been provided, 20 mg x 4 days, 10 mg x1 day, then stop.  Instructions have been provided and discussed for H1/H2 receptor blockade with step-wise increase/decrease to find lowest effective  dose.  Should symptoms recur, a journal is to be kept recording any foods eaten, beverages consumed, medications taken within a 6 hour period prior to the onset of symptoms, as well as record activities being performed, and environmental conditions. For any symptoms concerning for anaphylaxis, epinephrine is to be administered and 911 is to be called immediately.  Perennial and seasonal allergic rhinitis  Aeroallergen avoidance measures have been discussed and provided in written form.  A prescription has been provided for fluticasone nasal spray, 2 sprays per nostril daily as needed. Proper nasal spray technique has been discussed and demonstrated.  A prescription has been provided for levocetirizine, 5 mg daily as needed.  Mild intermittent asthma  Tobacco cessation is encouraged.  Continue albuterol HFA, 1-2 inhalations every 4-6 hours as needed.  Subjective and objective measures of pulmonary function will be followed and the treatment plan will be adjusted accordingly.    Meds ordered this encounter  Medications  . levocetirizine (XYZAL) 5 MG tablet    Sig: TAKE ONE TABLET DAILY AS DIRECTED    Dispense:  30 tablet    Refill:  5  . cetirizine (ZYRTEC) 10 MG tablet    Sig: TAKE ONE TABLET DAILY AS DIRECTED    Dispense:  30 tablet    Refill:  5  . ranitidine (ZANTAC) 150 MG tablet    Sig: TAKE ONE TABLET ONCE OR TWICE DAILY AS DIRECTED    Dispense:  60 tablet    Refill:  5  . EPINEPHrine 0.3 mg/0.3 mL IJ SOAJ injection    Sig: USE AS DIRECTED FOR LIFE THREATENING ALLERGIC REACTIONS    Dispense:  2 Device    Refill:  3  DISPENSE [place]. NO ADRENACLICK.  Marland Kitchen predniSONE (DELTASONE) tablet 10 mg    Sig:     Diagnositics: Spirometry: FVC was 2.66 L and FEV1 was 2.35 L (75% predicted) with partial (180 mL, 8%) post bronchodilator improvement. Environmental skin testing: Positive to Tree pollen, mold, and dust mite antigen. Food allergen skin testing: Negative  despite a positive histamine control. Latex skin testing: Borderline positive at 1:100 epicutaneous and positive for 1:10 epicutaneous.    Physical examination: Blood pressure 140/90, pulse 88, temperature 98 F (36.7 C), temperature source Oral, resp. rate 18, height 5' 8.11" (1.73 m), weight 206 lb 12.7 oz (93.8 kg), last menstrual period 01/08/2014.  General: Alert, interactive, in no acute distress. HEENT: TMs pearly gray, turbinates edematous with clear discharge, post-pharynx moderately erythematous. Neck: Supple without lymphadenopathy. Lungs: Clear to auscultation without wheezing, rhonchi or rales. CV: Normal S1, S2 without murmurs. Abdomen: Nondistended, nontender. Skin: Scattered erythematous urticarial type lesions primarily located arms and chin , nonvesicular. Extremities:  No clubbing, cyanosis or edema. Neuro:   Grossly intact.  Review of systems:  Review of Systems  Constitutional: Negative for fever, chills and weight loss.  HENT: Positive for congestion. Negative for nosebleeds.   Eyes: Negative for blurred vision.  Respiratory: Positive for cough, shortness of breath and wheezing. Negative for hemoptysis.   Cardiovascular: Negative for chest pain.  Gastrointestinal: Negative for diarrhea and constipation.  Genitourinary: Negative for dysuria.  Musculoskeletal: Negative for myalgias and joint pain.  Skin: Positive for itching and rash.  Neurological: Positive for headaches. Negative for dizziness.  Endo/Heme/Allergies: Positive for environmental allergies. Does not bruise/bleed easily.    Past medical history:  Past Medical History  Diagnosis Date  . Fibroids   . History of preterm labor   . History of recurrent UTI (urinary tract infection)   . Uterine fibroids affecting pregnancy   . Obese   . Polyhydramnios 09/20/2011  . S/P cesarean section 09/20/2011  . Encounter for female sterilization procedure 12/06/2011  . S/P tubal ligation 12/07/2011  . SVD  (spontaneous vaginal delivery)     x 2  . Mild preeclampsia 09/20/2011    Hix with 2013 pregnancy  PIH - resolved, no longer an issue  . Depression     no meds  . Anxiety     no meds  . Fibromyalgia     no meds  . Intramural leiomyoma of uterus 01/21/2014  . Adenomyosis 01/21/2014  . Abnormal uterine bleeding (AUB) 01/21/2014  . Pelvic pain in female 01/21/2014  . Endometrioma of ovary 08/04/2015  . Vaginal delivery 1995, 1998    Past surgical history:  Past Surgical History  Procedure Laterality Date  . Knee surgery      right - dislocated knee cap  . Cesarean section  09/20/2011    Procedure: CESAREAN SECTION;  Surgeon: Thornell Sartorius, MD;  Location: [facility];  Service: Gynecology;  Laterality: N/A;  . Laparoscopic tubal ligation  12/07/2011    Procedure: LAPAROSCOPIC TUBAL LIGATION;  Surgeon: Thornell Sartorius, MD;  Location: [facility];  Service: Gynecology;  Laterality: Bilateral;  . Cholecystectomy      Lap  . Laparoscopic assisted vaginal hysterectomy N/A 01/22/2014    Procedure: LAPAROSCOPIC ASSISTED VAGINAL HYSTERECTOMY, Bilateral Salpingectomy;  Surgeon: Janyth Contes, MD;  Location: [facility];  Service: Gynecology;  Laterality: N/A;  . Laparoscopic ovarian cystectomy  08/05/2015    Procedure: LAPAROSCOPY;  Surgeon: Janyth Contes, MD;  Location: [facility];  Service: Gynecology;;  . Oophorectomy Right 08/05/2015  Procedure: OOPHORECTOMY;  Surgeon: Janyth Contes, MD;  Location: [facility];  Service: Gynecology;  Laterality: Right;  . Laparoscopic lysis of adhesions  08/05/2015    Procedure: LAPAROSCOPIC LYSIS OF ADHESIONS;  Surgeon: Janyth Contes, MD;  Location: [facility];  Service: Gynecology;;    Family history: Family History  Problem Relation Age of Onset  . Cancer Mother     breast  . Hypertension Mother   . Heart disease Father   . Hypertension Father   . Asthma Sister   . Mental illness Sister     bipolar, schizophrenia,   . Ovarian cysts Sister   . Seizures Sister     . Allergic rhinitis Sister   . Diabetes Maternal Grandmother   . Depression Maternal Grandfather     suicide    Social history: Social History   Social History  . Marital Status: Single    Spouse Name: N/A  . Number of Children: N/A  . Years of Education: N/A   Occupational History  . Not on file.   Social History Main Topics  . Smoking status: Current Some Day Smoker -- 0.00 packs/day    Types: Cigarettes  . Smokeless tobacco: Never Used  . Alcohol Use: Yes     Comment: Socially  . Drug Use: No  . Sexual Activity: Yes    Birth Control/ Protection: Surgical   Other Topics Concern  . Not on file   Social History Narrative   Environmental History: The patient lives in a 42 year old mobile home with carpeting in the bedroom, central heat and window air-conditioning units.  There are 3 dogs in the mobile home which do not have access to her bedroom. She has smoked a half-pack of cigarettes per day on average since 1997.    Medication List       This list is accurate as of: 09/29/15  7:29 PM.  Always use your most recent med list.               cetirizine 10 MG tablet  Commonly known as:  ZYRTEC  TAKE ONE TABLET DAILY AS DIRECTED     EPINEPHrine 0.3 mg/0.3 mL Soaj injection  Commonly known as:  EPI-PEN  USE AS DIRECTED FOR LIFE THREATENING ALLERGIC REACTIONS     ibuprofen 800 MG tablet  Commonly known as:  ADVIL,MOTRIN  Take 1 tablet (800 mg total) by mouth every 8 (eight) hours as needed for fever or moderate pain (mild pain).     levocetirizine 5 MG tablet  Commonly known as:  XYZAL  TAKE ONE TABLET DAILY AS DIRECTED     multivitamin with minerals Tabs tablet  Take 1 tablet by mouth daily.     pravastatin 40 MG tablet  Commonly known as:  PRAVACHOL  Take 1 tablet (40 mg total) by mouth daily.     ranitidine 150 MG tablet  Commonly known as:  ZANTAC  TAKE ONE TABLET ONCE OR TWICE DAILY AS DIRECTED     ZOLOFT 50 MG tablet  Generic drug:  sertraline   Take 50 mg by mouth daily.        Known medication allergies: Allergies  Allergen Reactions  . Hydrocodone-Acetaminophen Itching and Nausea And Vomiting    And dizziness    I appreciate the opportunity to take part in this Antonique's care. Please do not hesitate to contact me with questions.  Sincerely,   R. Edgar Frisk, MD

## 2015-09-29 NOTE — Assessment & Plan Note (Addendum)
   Tobacco cessation is encouraged.  Continue albuterol HFA, 1-2 inhalations every 4-6 hours as needed.  Subjective and objective measures of pulmonary function will be followed and the treatment plan will be adjusted accordingly.

## 2015-09-29 NOTE — Patient Instructions (Addendum)
Allergic reaction Allergic reaction to latex versus to carry with associated angioedema.  Skin tests today was positive to latex. Meticulous avoidance of latex.  A prescription has been provided for EpiPen 0.3 mg 2 pack along with instructions for its proper administration.  Prednisone has been provided, 20 mg x 4 days, 10 mg x1 day, then stop.  Instructions have been provided and discussed for H1/H2 receptor blockade with step-wise increase/decrease to find lowest effective dose.  Should symptoms recur, a journal is to be kept recording any foods eaten, beverages consumed, medications taken within a 6 hour period prior to the onset of symptoms, as well as record activities being performed, and environmental conditions. For any symptoms concerning for anaphylaxis, epinephrine is to be administered and 911 is to be called immediately.  Perennial and seasonal allergic rhinitis  Aeroallergen avoidance measures have been discussed and provided in written form.  A prescription has been provided for fluticasone nasal spray, 2 sprays per nostril daily as needed. Proper nasal spray technique has been discussed and demonstrated.  A prescription has been provided for levocetirizine, 5 mg daily as needed.  Mild intermittent asthma  Tobacco cessation is encouraged.  Continue albuterol HFA, 1-2 inhalations every 4-6 hours as needed.  Subjective and objective measures of pulmonary function will be followed and the treatment plan will be adjusted accordingly.    Return in about 6 weeks (around 11/10/2015), or if symptoms worsen or fail to improve.  Urticaria (Hives)  . Levocetirizine (Xyzal) 5 mg in morning and Cetirizine (Zyrtec) 10mg  at night and ranitidine (Zantac) 150 mg twice a day. If no symptoms for 7-14 days then decrease to. . Levocetirizine (Xyzal) 5 mg in morning and Cetirizine (Zyrtec) 10mg  at night and ranitidine (Zantac) 150 mg once a day.  If no symptoms for 7-14 days then decrease  to. . Levocetirizine (Xyzal) 5 mg in morning and Cetirizine (Zyrtec) 10mg  at night.  If no symptoms for 7-14 days then decrease to. . Levocetirizine (Xyzal) 5 mg once a day.  May use Benadryl (diphenhydramine) as needed for breakthrough symptoms       If symptoms return, then step up dosage  Control of [place] dust mites play a major role in allergic asthma and rhinitis.  They occur in environments with high humidity wherever human skin, the food for dust mites is found. High levels have been detected in dust obtained from mattresses, pillows, carpets, upholstered furniture, bed covers, clothes and soft toys.  The principal allergen of the house dust mite is found in its feces.  A gram of dust may contain 1,000 mites and 250,000 fecal particles.  Mite antigen is easily measured in the air during house cleaning activities.    1. Encase mattresses, including the box spring, and pillow, in an air tight cover.  Seal the zipper end of the encased mattresses with wide adhesive tape. 2. Wash the bedding in water of 130 degrees Farenheit weekly.  Avoid cotton comforters/quilts and flannel bedding: the most ideal bed covering is the dacron comforter. 3. Remove all upholstered furniture from the bedroom. 4. Remove carpets, carpet padding, rugs, and non-washable window drapes from the bedroom.  Wash drapes weekly or use plastic window coverings. 5. Remove all non-washable stuffed toys from the bedroom.  Wash stuffed toys weekly. 6. Have the room cleaned frequently with a vacuum cleaner and a damp dust-mop.  The patient should not be in a room which is being cleaned and should wait 1 hour  after cleaning before going into the room. 7. Close and seal all heating outlets in the bedroom.  Otherwise, the room will become filled with dust-laden air.  An electric heater can be used to heat the room. 8. Reduce indoor humidity to less than 50%.  Do not use a humidifier.

## 2015-12-31 ENCOUNTER — Ambulatory Visit: Payer: Medicaid Other | Admitting: Family Medicine

## 2016-05-05 ENCOUNTER — Other Ambulatory Visit: Payer: Self-pay | Admitting: Allergy and Immunology

## 2016-05-05 DIAGNOSIS — J3089 Other allergic rhinitis: Secondary | ICD-10-CM

## 2016-08-07 ENCOUNTER — Encounter (HOSPITAL_COMMUNITY): Payer: Self-pay

## 2016-08-07 ENCOUNTER — Emergency Department (HOSPITAL_COMMUNITY)
Admission: EM | Admit: 2016-08-07 | Discharge: 2016-08-08 | Disposition: A | Discharge status: Home / Self Care | Payer: Medicaid Other | Attending: Emergency Medicine | Admitting: Emergency Medicine

## 2016-08-07 ENCOUNTER — Emergency Department (HOSPITAL_COMMUNITY): Payer: Medicaid Other

## 2016-08-07 DIAGNOSIS — R69 Illness, unspecified: Secondary | ICD-10-CM

## 2016-08-07 DIAGNOSIS — F17210 Nicotine dependence, cigarettes, uncomplicated: Secondary | ICD-10-CM | POA: Diagnosis not present

## 2016-08-07 DIAGNOSIS — J111 Influenza due to unidentified influenza virus with other respiratory manifestations: Secondary | ICD-10-CM | POA: Diagnosis not present

## 2016-08-07 DIAGNOSIS — R05 Cough: Secondary | ICD-10-CM | POA: Diagnosis present

## 2016-08-07 DIAGNOSIS — J45909 Unspecified asthma, uncomplicated: Secondary | ICD-10-CM | POA: Diagnosis not present

## 2016-08-07 LAB — BASIC METABOLIC PANEL
Anion gap: 12 (ref 5–15)
BUN: 10 mg/dL (ref 6–20)
CO2: 25 mmol/L (ref 22–32)
CREATININE: 0.66 mg/dL (ref 0.44–1.00)
Calcium: 9.8 mg/dL (ref 8.9–10.3)
Chloride: 100 mmol/L — ABNORMAL LOW (ref 101–111)
GFR calc Af Amer: >60 mL/min (ref >60)
GFR calc non Af Amer: >60 mL/min (ref >60)
GLUCOSE: 114 mg/dL — AB (ref 65–99)
Potassium: 3.3 mmol/L — ABNORMAL LOW (ref 3.5–5.1)
SODIUM: 137 mmol/L (ref 135–145)

## 2016-08-07 LAB — CBC
HCT: 37.7 % (ref 36.0–46.0)
Hemoglobin: 12.9 g/dL (ref 12.0–15.0)
MCH: 30.9 pg (ref 26.0–34.0)
MCHC: 34.2 g/dL (ref 30.0–36.0)
MCV: 90.4 fL (ref 78.0–100.0)
PLATELETS: 273 10*3/uL (ref 150–400)
RBC: 4.17 MIL/uL (ref 3.87–5.11)
RDW: 12.2 % (ref 11.5–15.5)
WBC: 20.5 10*3/uL — ABNORMAL HIGH (ref 4.0–10.5)

## 2016-08-07 LAB — I-STAT TROPONIN, ED: Troponin i, poc: 0 ng/mL (ref 0.00–0.08)

## 2016-08-07 MED ORDER — KETOROLAC TROMETHAMINE 30 MG/ML IJ SOLN
15.0000 mg | Freq: Once | INTRAMUSCULAR | Status: AC
  Administered 2016-08-08: 15 mg via INTRAVENOUS
  Filled 2016-08-07: qty 1

## 2016-08-07 MED ORDER — SODIUM CHLORIDE 0.9 % IV BOLUS (SEPSIS)
1000.0000 mL | Freq: Once | INTRAVENOUS | Status: AC
  Administered 2016-08-08: 1000 mL via INTRAVENOUS

## 2016-08-07 NOTE — ED Triage Notes (Signed)
Pt she started to feel like a cold was coming 3 days ago; pt states she started having chest pain with n/v and nose bleeds; Pt also c/o sore throat and hard to breath;  Pt states pain at 7/10 on arrival; Pt a&ox 4

## 2016-08-08 MED ORDER — ALBUTEROL SULFATE (2.5 MG/3ML) 0.083% IN NEBU
5.0000 mg | INHALATION_SOLUTION | Freq: Once | RESPIRATORY_TRACT | Status: AC
  Administered 2016-08-08: 5 mg via RESPIRATORY_TRACT
  Filled 2016-08-08: qty 6

## 2016-08-08 MED ORDER — IPRATROPIUM BROMIDE 0.02 % IN SOLN
0.5000 mg | Freq: Once | RESPIRATORY_TRACT | Status: AC
  Administered 2016-08-08: 0.5 mg via RESPIRATORY_TRACT
  Filled 2016-08-08: qty 2.5

## 2016-08-08 NOTE — ED Provider Notes (Signed)
[facility] DEPT Provider Note   CSN: [id number] Arrival date & time: 08/07/16  2231     History   Chief Complaint Chief Complaint  Patient presents with  . Sore Throat  . Chest Pain  . Epistaxis    HPI [name] is a 41 y.o. female.  Patient presents with complaint of sudden onset generalized and severe body aches associated with cough, nasal discharge and sore throat. She reports her daughter had similar symptoms last week and tested negative for flu, but had another family member test positive. No diarrhea. Today she started having nausea, vomiting and chest discomfort.    The history is provided by the patient. No language interpreter was used.  Sore Throat  Associated symptoms include chest pain. Pertinent negatives include no abdominal pain.  Chest Pain   Associated symptoms include cough, nausea and vomiting. Pertinent negatives include no abdominal pain and no fever.  Epistaxis      Past Medical History:  Diagnosis Date  . Abnormal uterine bleeding (AUB) 01/21/2014  . Adenomyosis 01/21/2014  . Anxiety    no meds  . Depression    no meds  . Encounter for female sterilization procedure 12/06/2011  . Endometrioma of ovary 08/04/2015  . Fibroids   . Fibromyalgia    no meds  . History of preterm labor   . History of recurrent UTI (urinary tract infection)   . Intramural leiomyoma of uterus 01/21/2014  . Mild preeclampsia 09/20/2011   Hix with 2013 pregnancy  PIH - resolved, no longer an issue  . Obese   . Pelvic pain in female 01/21/2014  . Polyhydramnios 09/20/2011  . S/P cesarean section 09/20/2011  . S/P tubal ligation 12/07/2011  . SVD (spontaneous vaginal delivery)    x 2  . Uterine fibroids affecting pregnancy   . Vaginal delivery 1995, 1998    Patient Active Problem List   Diagnosis Date Noted  . Allergic reaction 09/29/2015  . Perennial and seasonal allergic rhinitis 09/29/2015  . Mild intermittent asthma 09/29/2015  . Endometrioma of ovary 08/04/2015   . S/P laparoscopic assisted vaginal hysterectomy (LAVH) 01/22/2014  . Intramural leiomyoma of uterus 01/21/2014  . Adenomyosis 01/21/2014  . Abnormal uterine bleeding (AUB) 01/21/2014  . Pelvic pain in female 01/21/2014  . S/P tubal ligation 12/07/2011  . Polyhydramnios 09/20/2011  . Mild preeclampsia 09/20/2011  . S/P cesarean section 09/20/2011    Past Surgical History:  Procedure Laterality Date  . CESAREAN SECTION  09/20/2011   Procedure: CESAREAN SECTION;  Surgeon: Thornell Sartorius, MD;  Location: [facility];  Service: Gynecology;  Laterality: N/A;  . CHOLECYSTECTOMY     Lap  . KNEE SURGERY     right - dislocated knee cap  . LAPAROSCOPIC ASSISTED VAGINAL HYSTERECTOMY N/A 01/22/2014   Procedure: LAPAROSCOPIC ASSISTED VAGINAL HYSTERECTOMY, Bilateral Salpingectomy;  Surgeon: Janyth Contes, MD;  Location: [facility];  Service: Gynecology;  Laterality: N/A;  . LAPAROSCOPIC LYSIS OF ADHESIONS  08/05/2015   Procedure: LAPAROSCOPIC LYSIS OF ADHESIONS;  Surgeon: Janyth Contes, MD;  Location: [facility];  Service: Gynecology;;  . LAPAROSCOPIC OVARIAN CYSTECTOMY  08/05/2015   Procedure: LAPAROSCOPY;  Surgeon: Janyth Contes, MD;  Location: [facility];  Service: Gynecology;;  . LAPAROSCOPIC TUBAL LIGATION  12/07/2011   Procedure: LAPAROSCOPIC TUBAL LIGATION;  Surgeon: Thornell Sartorius, MD;  Location: [facility];  Service: Gynecology;  Laterality: Bilateral;  . OOPHORECTOMY Right 08/05/2015   Procedure: OOPHORECTOMY;  Surgeon: Janyth Contes, MD;  Location: [facility];  Service: Gynecology;  Laterality:  Right;    OB History    Gravida Para Term Preterm AB Living   4 3 1 2 1 3    SAB TAB Ectopic Multiple Live Births   1 0 0 0 3       Home Medications    Prior to Admission medications   Medication Sig Start Date End Date Taking? Authorizing Provider  cetirizine (ZYRTEC) 10 MG tablet TAKE ONE TABLET DAILY AS DIRECTED 05/06/16   Adelina Mings, MD  EPINEPHrine 0.3 mg/0.3 mL IJ SOAJ injection  USE AS DIRECTED FOR LIFE THREATENING ALLERGIC REACTIONS 09/29/15   Adelina Mings, MD  ibuprofen (ADVIL,MOTRIN) 800 MG tablet Take 1 tablet (800 mg total) by mouth every 8 (eight) hours as needed for fever or moderate pain (mild pain). 08/05/15   Janyth Contes, MD  levocetirizine (XYZAL) 5 MG tablet TAKE ONE TABLET DAILY AS DIRECTED 09/29/15   Adelina Mings, MD  Multiple Vitamin (MULTIVITAMIN WITH MINERALS) TABS tablet Take 1 tablet by mouth daily.    Historical Provider, MD  pravastatin (PRAVACHOL) 40 MG tablet Take 1 tablet (40 mg total) by mouth daily. 07/06/15   Micheline Chapman, NP  ranitidine (ZANTAC) 150 MG tablet TAKE ONE TABLET ONCE OR TWICE DAILY AS DIRECTED 09/29/15   Adelina Mings, MD  sertraline (ZOLOFT) 50 MG tablet Take 50 mg by mouth daily.    Historical Provider, MD    Family History Family History  Problem Relation Age of Onset  . Cancer Mother     breast  . Hypertension Mother   . Heart disease Father   . Hypertension Father   . Asthma Sister   . Mental illness Sister     bipolar, schizophrenia,   . Ovarian cysts Sister   . Seizures Sister   . Allergic rhinitis Sister   . Diabetes Maternal Grandmother   . Depression Maternal Grandfather     suicide    Social History Social History  Substance Use Topics  . Smoking status: Current Some Day Smoker    Packs/day: 0.00    Types: Cigarettes  . Smokeless tobacco: Never Used  . Alcohol use Yes     Comment: Socially     Allergies   Hydrocodone-acetaminophen   Review of Systems Review of Systems  Constitutional: Negative for chills and fever.  HENT: Positive for congestion, nosebleeds and sore throat. Negative for trouble swallowing.   Respiratory: Positive for cough and chest tightness.   Cardiovascular: Positive for chest pain.  Gastrointestinal: Positive for nausea and vomiting. Negative for abdominal pain, constipation and diarrhea.  Genitourinary: Negative.   Musculoskeletal:  Positive for arthralgias and myalgias.  Neurological: Negative.      Physical Exam Updated Vital Signs BP (!) 150/103   Pulse 92   Temp 98.9 F (37.2 C) (Oral)   Resp 22   Ht 5\' 8"  (1.727 m)   Wt 81.6 kg   LMP 01/08/2014   SpO2 99%   BMI 27.37 kg/m   Physical Exam  Constitutional: She is oriented to person, place, and time. She appears well-developed and well-nourished.  Patient appears uncomfortable.   HENT:  Head: Normocephalic.  Nose: Mucosal edema present.  Mouth/Throat: Oropharynx is clear and moist.  Neck: Normal range of motion. Neck supple.  Cardiovascular: Normal rate and regular rhythm.   Pulmonary/Chest: Effort normal and breath sounds normal.  Expiratory wheezing bilaterally. Full air movement.   Abdominal: Soft. Bowel sounds are normal. There is no tenderness. There is no rebound and no  guarding.  Musculoskeletal: Normal range of motion.  Neurological: She is alert and oriented to person, place, and time.  Skin: Skin is warm and dry. No rash noted.  Psychiatric: She has a normal mood and affect.     ED Treatments / Results  Labs (all labs ordered are listed, but only abnormal results are displayed) Labs Reviewed  BASIC METABOLIC PANEL - Abnormal; Notable for the following:       Result Value   Potassium 3.3 (*)    Chloride 100 (*)    Glucose, Bld 114 (*)    All other components within normal limits  CBC - Abnormal; Notable for the following:    WBC 20.5 (*)    All other components within normal limits  I-STAT TROPOININ, ED    EKG  EKG Interpretation  Date/Time:  Sunday August 07 2016 22:43:06 EST Ventricular Rate:  93 PR Interval:  152 QRS Duration: 98 QT Interval:  374 QTC Calculation: 465 R Axis:   74 Text Interpretation:  Normal sinus rhythm Normal ECG No old tracing to compare Confirmed by [facility]  MD, DAVID ([id number]) on 08/07/2016 11:30:34 PM       Radiology Dg Chest 2 View  Result Date: 08/07/2016 CLINICAL DATA:  41 year old  female with chest pain and shortness of breath. EXAM: CHEST  2 VIEW COMPARISON:  None. FINDINGS: The heart size and mediastinal contours are within normal limits. Both lungs are clear. The visualized skeletal structures are unremarkable. Right upper quadrant cholecystectomy clips noted. IMPRESSION: No active cardiopulmonary disease. Electronically Signed   By: Anner Crete M.D.   On: 08/07/2016 23:41    Procedures Procedures (including critical care time)  Medications Ordered in ED Medications  sodium chloride 0.9 % bolus 1,000 mL (not administered)  ketorolac (TORADOL) 30 MG/ML injection 15 mg (not administered)  albuterol (PROVENTIL) (2.5 MG/3ML) 0.083% nebulizer solution 5 mg (not administered)  ipratropium (ATROVENT) nebulizer solution 0.5 mg (not administered)     Initial Impression / Assessment and Plan / ED Course  I have reviewed the triage vital signs and the nursing notes.  Pertinent labs & imaging results that were available during my care of the patient were reviewed by me and considered in my medical decision making (see chart for details).     Patient presents with flu like symptoms of body aches, congestion, cough. She feels much better after IVF's and albuterol. She appears much improved. She can be discharged home with return precautions.   Final Clinical Impressions(s) / ED Diagnoses   Final diagnoses:  None   1. Influenza like illness  New Prescriptions New Prescriptions   No medications on file     Charlann Lange, PA-C 08/08/16 0114    Everlene Balls, MD 08/08/16 [phone]

## 2016-08-23 ENCOUNTER — Encounter (HOSPITAL_COMMUNITY): Payer: Self-pay | Admitting: Family Medicine

## 2016-08-23 ENCOUNTER — Ambulatory Visit (HOSPITAL_COMMUNITY)
Admission: EM | Admit: 2016-08-23 | Discharge: 2016-08-23 | Disposition: A | Discharge status: Home / Self Care | Payer: Medicaid Other | Attending: Internal Medicine | Admitting: Internal Medicine

## 2016-08-23 DIAGNOSIS — J0100 Acute maxillary sinusitis, unspecified: Secondary | ICD-10-CM | POA: Diagnosis not present

## 2016-08-23 MED ORDER — BENZONATATE 100 MG PO CAPS: 100.0000 mg | ORAL_CAPSULE | Freq: Three times a day (TID) | ORAL | 0 refills | Status: DC

## 2016-08-23 MED ORDER — AMOXICILLIN-POT CLAVULANATE 875-125 MG PO TABS: 1.0000 | ORAL_TABLET | Freq: Two times a day (BID) | ORAL | 0 refills | Status: DC

## 2016-08-23 MED ORDER — PREDNISONE 50 MG PO TABS: ORAL_TABLET | ORAL | 0 refills | Status: DC

## 2016-08-23 NOTE — ED Provider Notes (Signed)
CSN: [id number]     Arrival date & time 08/23/16  1356 History   First MD Initiated Contact with Patient 08/23/16 1517     Chief Complaint  Patient presents with  . Cough  . Nasal Congestion  . Ear Fullness   (Consider location/radiation/quality/duration/timing/severity/associated sxs/prior Treatment) 41 year old female presents to clinic with chief complaint of sinus pain, sinus pressure, congestion, fever, and green nasal discharge. States her symptoms of been worsening over the previous 48 hours, she states she was recently diagnosed, and treated with the flu and that she states she has not fully recovered, however she was starting to feel better until the last 2 days when she felt worse. She denies chest pain, shortness of breath, wheezing, does have a cough is dry, hacking, nonproductive, denies sore throat, denies any weakness, dizziness, loss of appetite, denies any abdominal pain, nausea, vomiting, diarrhea, she denies any dysuria, swelling in her hands, feet, or ankles.   The history is provided by the patient.    Past Medical History:  Diagnosis Date  . Abnormal uterine bleeding (AUB) 01/21/2014  . Adenomyosis 01/21/2014  . Anxiety    no meds  . Depression    no meds  . Encounter for female sterilization procedure 12/06/2011  . Endometrioma of ovary 08/04/2015  . Fibroids   . Fibromyalgia    no meds  . History of preterm labor   . History of recurrent UTI (urinary tract infection)   . Intramural leiomyoma of uterus 01/21/2014  . Mild preeclampsia 09/20/2011   Hix with 2013 pregnancy  PIH - resolved, no longer an issue  . Obese   . Pelvic pain in female 01/21/2014  . Polyhydramnios 09/20/2011  . S/P cesarean section 09/20/2011  . S/P tubal ligation 12/07/2011  . SVD (spontaneous vaginal delivery)    x 2  . Uterine fibroids affecting pregnancy   . Vaginal delivery 1995, 1998   Past Surgical History:  Procedure Laterality Date  . CESAREAN SECTION  09/20/2011   Procedure: CESAREAN  SECTION;  Surgeon: Thornell Sartorius, MD;  Location: [facility];  Service: Gynecology;  Laterality: N/A;  . CHOLECYSTECTOMY     Lap  . KNEE SURGERY     right - dislocated knee cap  . LAPAROSCOPIC ASSISTED VAGINAL HYSTERECTOMY N/A 01/22/2014   Procedure: LAPAROSCOPIC ASSISTED VAGINAL HYSTERECTOMY, Bilateral Salpingectomy;  Surgeon: Janyth Contes, MD;  Location: [facility];  Service: Gynecology;  Laterality: N/A;  . LAPAROSCOPIC LYSIS OF ADHESIONS  08/05/2015   Procedure: LAPAROSCOPIC LYSIS OF ADHESIONS;  Surgeon: Janyth Contes, MD;  Location: [facility];  Service: Gynecology;;  . LAPAROSCOPIC OVARIAN CYSTECTOMY  08/05/2015   Procedure: LAPAROSCOPY;  Surgeon: Janyth Contes, MD;  Location: [facility];  Service: Gynecology;;  . LAPAROSCOPIC TUBAL LIGATION  12/07/2011   Procedure: LAPAROSCOPIC TUBAL LIGATION;  Surgeon: Thornell Sartorius, MD;  Location: [facility];  Service: Gynecology;  Laterality: Bilateral;  . OOPHORECTOMY Right 08/05/2015   Procedure: OOPHORECTOMY;  Surgeon: Janyth Contes, MD;  Location: [facility];  Service: Gynecology;  Laterality: Right;   Family History  Problem Relation Age of Onset  . Cancer Mother     breast  . Hypertension Mother   . Heart disease Father   . Hypertension Father   . Asthma Sister   . Mental illness Sister     bipolar, schizophrenia,   . Ovarian cysts Sister   . Seizures Sister   . Allergic rhinitis Sister   . Diabetes Maternal Grandmother   . Depression Maternal Grandfather  suicide   Social History  Substance Use Topics  . Smoking status: Current Some Day Smoker    Packs/day: 0.00    Types: Cigarettes  . Smokeless tobacco: Never Used  . Alcohol use Yes     Comment: Socially   OB History    Gravida Para Term Preterm AB Living   4 3 1 2 1 3    SAB TAB Ectopic Multiple Live Births   1 0 0 0 3     Review of Systems  Reason unable to perform ROS: as covered in HPI.  All other systems reviewed and are negative.   Allergies   Hydrocodone-acetaminophen  Home Medications   Prior to Admission medications   Medication Sig Start Date End Date Taking? Authorizing Provider  acetaminophen (TYLENOL) 325 MG tablet Take 325 mg by mouth every 6 (six) hours as needed for mild pain.    Historical Provider, MD  amoxicillin-clavulanate (AUGMENTIN) 875-125 MG tablet Take 1 tablet by mouth 2 (two) times daily. 08/23/16   Barnet Glasgow, NP  benzonatate (TESSALON) 100 MG capsule Take 1 capsule (100 mg total) by mouth every 8 (eight) hours. 08/23/16   Barnet Glasgow, NP  cetirizine (ZYRTEC) 10 MG tablet TAKE ONE TABLET DAILY AS DIRECTED 05/06/16   Adelina Mings, MD  EPINEPHrine 0.3 mg/0.3 mL IJ SOAJ injection USE AS DIRECTED FOR LIFE THREATENING ALLERGIC REACTIONS 09/29/15   Adelina Mings, MD  ibuprofen (ADVIL,MOTRIN) 800 MG tablet Take 1 tablet (800 mg total) by mouth every 8 (eight) hours as needed for fever or moderate pain (mild pain). 08/05/15   Janyth Contes, MD  levocetirizine (XYZAL) 5 MG tablet TAKE ONE TABLET DAILY AS DIRECTED 09/29/15   Adelina Mings, MD  Multiple Vitamin (MULTIVITAMIN WITH MINERALS) TABS tablet Take 1 tablet by mouth daily.    Historical Provider, MD  pravastatin (PRAVACHOL) 40 MG tablet Take 1 tablet (40 mg total) by mouth daily. 07/06/15   Micheline Chapman, NP  predniSONE (DELTASONE) 50 MG tablet Take 1 tablet daily with food 08/23/16   Barnet Glasgow, NP  sertraline (ZOLOFT) 50 MG tablet Take 50 mg by mouth daily.    Historical Provider, MD   Meds Ordered and Administered this Visit  Medications - No data to display  BP (!) 148/108   Pulse 72   Temp 98.5 F (36.9 C) Comment: with tylenol  Resp 18   LMP 01/08/2014   SpO2 97%  No data found.   Physical Exam  Constitutional: She is oriented to person, place, and time. She appears well-developed and well-nourished. She does not have a sickly appearance. She does not appear ill. No distress.  HENT:  Head: Normocephalic  and atraumatic.  Right Ear: Tympanic membrane and external ear normal.  Left Ear: Tympanic membrane and external ear normal.  Nose: Mucosal edema and rhinorrhea present. Right sinus exhibits maxillary sinus tenderness. Right sinus exhibits no frontal sinus tenderness. Left sinus exhibits no maxillary sinus tenderness and no frontal sinus tenderness.  Mouth/Throat: Uvula is midline and oropharynx is clear and moist. No oropharyngeal exudate.  Eyes: Pupils are equal, round, and reactive to light.  Neck: Normal range of motion. Neck supple. No JVD present.  Cardiovascular: Normal rate and regular rhythm.   Pulmonary/Chest: Effort normal and breath sounds normal. No respiratory distress. She has no wheezes.  Abdominal: Soft. Bowel sounds are normal. She exhibits no distension. There is no tenderness. There is no guarding.  Lymphadenopathy:       Head (right  side): No submental, no submandibular, no tonsillar and no preauricular adenopathy present.       Head (left side): No submental, no submandibular, no tonsillar and no preauricular adenopathy present.    She has no cervical adenopathy.  Neurological: She is alert and oriented to person, place, and time.  Skin: Skin is warm and dry. Capillary refill takes less than 2 seconds. She is not diaphoretic.  Psychiatric: She has a normal mood and affect.  Nursing note and vitals reviewed.   Urgent Care Course     Procedures (including critical care time)  Labs Review Labs Reviewed - No data to display  Imaging Review No results found.    MDM   1. Acute non-recurrent maxillary sinusitis    I am treating your for acute sinusitis. I have prescribed Augmentin 1 tablet twice a day for 10 days, prednisone 1 tablet daily with food, For cough, I have prescribed a medication called Tessalon. Take 1 tablet every 8 hours as needed for your cough. For fever, take tylenol every 4 hours or ibuprofen every 6 hours as needed.     Barnet Glasgow,  NP 08/23/16 1531

## 2016-08-23 NOTE — Discharge Instructions (Addendum)
I am treating your for acute sinusitis. I have prescribed Augmentin 1 tablet twice a day for 10 days, prednisone 1 tablet daily with food, For cough, I have prescribed a medication called Tessalon. Take 1 tablet every 8 hours as needed for your cough. For fever, take tylenol every 4 hours or ibuprofen every 6 hours as needed.

## 2016-08-23 NOTE — ED Triage Notes (Signed)
Pt here for URI symptoms. sts recently treated for the flu.

## 2016-09-15 ENCOUNTER — Other Ambulatory Visit: Payer: Self-pay | Admitting: Allergy and Immunology

## 2016-09-15 DIAGNOSIS — J3089 Other allergic rhinitis: Secondary | ICD-10-CM

## 2016-09-20 ENCOUNTER — Other Ambulatory Visit: Payer: Self-pay | Admitting: Allergy and Immunology

## 2016-09-20 DIAGNOSIS — J3089 Other allergic rhinitis: Secondary | ICD-10-CM

## 2017-10-30 ENCOUNTER — Encounter: Payer: Self-pay | Admitting: Psychology

## 2017-10-30 ENCOUNTER — Ambulatory Visit (INDEPENDENT_AMBULATORY_CARE_PROVIDER_SITE_OTHER): Payer: Medicaid Other | Admitting: Family Medicine

## 2017-10-30 ENCOUNTER — Other Ambulatory Visit: Payer: Self-pay

## 2017-10-30 ENCOUNTER — Encounter: Payer: Self-pay | Admitting: Family Medicine

## 2017-10-30 VITALS — BP 172/116 | HR 77 | Temp 99.4°F | Ht 68.0 in | Wt 209.0 lb

## 2017-10-30 DIAGNOSIS — R32 Unspecified urinary incontinence: Secondary | ICD-10-CM | POA: Diagnosis not present

## 2017-10-30 DIAGNOSIS — N800 Endometriosis of uterus: Secondary | ICD-10-CM

## 2017-10-30 DIAGNOSIS — F4310 Post-traumatic stress disorder, unspecified: Secondary | ICD-10-CM

## 2017-10-30 DIAGNOSIS — I10 Essential (primary) hypertension: Secondary | ICD-10-CM

## 2017-10-30 DIAGNOSIS — R5382 Chronic fatigue, unspecified: Secondary | ICD-10-CM

## 2017-10-30 DIAGNOSIS — E782 Mixed hyperlipidemia: Secondary | ICD-10-CM

## 2017-10-30 DIAGNOSIS — J4520 Mild intermittent asthma, uncomplicated: Secondary | ICD-10-CM

## 2017-10-30 DIAGNOSIS — F332 Major depressive disorder, recurrent severe without psychotic features: Secondary | ICD-10-CM | POA: Diagnosis present

## 2017-10-30 DIAGNOSIS — N809 Endometriosis, unspecified: Secondary | ICD-10-CM

## 2017-10-30 DIAGNOSIS — N8003 Adenomyosis of the uterus: Secondary | ICD-10-CM

## 2017-10-30 LAB — POCT URINALYSIS DIP (MANUAL ENTRY)
BILIRUBIN UA: NEGATIVE
Glucose, UA: NEGATIVE mg/dL
Ketones, POC UA: NEGATIVE mg/dL
LEUKOCYTES UA: NEGATIVE
NITRITE UA: NEGATIVE
PH UA: 7 (ref 5.0–8.0)
Spec Grav, UA: 1.015 (ref 1.010–1.025)
UROBILINOGEN UA: 0.2 U/dL

## 2017-10-30 LAB — POCT UA - MICROSCOPIC ONLY

## 2017-10-30 MED ORDER — AMLODIPINE BESYLATE 5 MG PO TABS: 5.0000 mg | ORAL_TABLET | Freq: Every day | ORAL | 3 refills | Status: DC
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
## 2017-10-30 NOTE — Patient Instructions (Signed)
It was a pleasure to see you today! Thank you for choosing [facility] for your primary care. [name] was seen for new patient.   Our plans for today were:  Come back and see me in 2 weeks.   Increase your sertraline to 75mg .   Start a new blood pressure medicine called amlodipine 5mg .   You should return to our clinic to see Dr. Lindell Noe in 2 weeks for mood.   Best,  Dr. Lindell Noe

## 2017-10-30 NOTE — Assessment & Plan Note (Addendum)
Sees allergy. FVC was 2.66 L and FEV1 was 2.35 L (75% predicted) with partial (180 mL, 8%) post bronchodilator improvement at last visit. No inhalers currently. No wheezing on my exam. Will consider PRN inhalers at next visit, much of our time spent on mood today.
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
## 2017-10-30 NOTE — Assessment & Plan Note (Signed)
Assessment / Plan / Recommendations: [name] has numerous stressors in her life, such as past trauma, being a caregiver to her son and to her mother, severe anxiety, severe depression, and food insecurity. The patient was extremely tearful and distressed during our time together. She is debilitated by anxious thoughts that her partner or mother will die. [name] felt that she functioned better when she was working full-time; she felt a sense of accomplishment. However, now that she has a six-year-old son, is caring for her mother and experiencing both anxiety and urinary incontinence, she does not feel that she can work. I provided empathy and reflective listening. We worked on the skill of diaphragmatic breathing. I will consult with Dr. Gwenlyn Saran regarding the best course of action for this patient. She would likely benefit from the Written Exposure Therapy protocol for PTSD (PCL-5 score of 72), which can be accomplished in five sessions. However, this may not be feasible in this Primary Care Setting. If so, a referral to [facility] or [facility] for PTSD treatment may be appropriate; we could continue to work on anxiety reduction techniques and perhaps some CBT to address both anxiety and depression in this patient.

## 2017-10-31 ENCOUNTER — Telehealth: Payer: Self-pay | Admitting: Family Medicine

## 2017-10-31 DIAGNOSIS — R32 Unspecified urinary incontinence: Secondary | ICD-10-CM

## 2017-10-31 DIAGNOSIS — E782 Mixed hyperlipidemia: Secondary | ICD-10-CM | POA: Insufficient documentation

## 2017-10-31 DIAGNOSIS — F339 Major depressive disorder, recurrent, unspecified: Secondary | ICD-10-CM | POA: Insufficient documentation

## 2017-10-31 DIAGNOSIS — I10 Essential (primary) hypertension: Secondary | ICD-10-CM | POA: Insufficient documentation

## 2017-10-31 HISTORY — DX: Unspecified urinary incontinence: R32

## 2017-10-31 LAB — CMP14+EGFR
A/G RATIO: 1.8 (ref 1.2–2.2)
ALBUMIN: 5 g/dL (ref 3.5–5.5)
ALT: 33 IU/L — ABNORMAL HIGH (ref 0–32)
AST: 25 IU/L (ref 0–40)
Alkaline Phosphatase: 82 IU/L (ref 39–117)
BUN / CREAT RATIO: 18 (ref 9–23)
BUN: 12 mg/dL (ref 6–24)
Bilirubin Total: 0.3 mg/dL (ref 0.0–1.2)
CALCIUM: 10.1 mg/dL (ref 8.7–10.2)
CO2: 19 mmol/L — ABNORMAL LOW (ref 20–29)
Chloride: 103 mmol/L (ref 96–106)
Creatinine, Ser: 0.66 mg/dL (ref 0.57–1.00)
GFR, EST AFRICAN AMERICAN: 124 mL/min/{1.73_m2} (ref >59)
GFR, EST NON AFRICAN AMERICAN: 108 mL/min/{1.73_m2} (ref >59)
Globulin, Total: 2.8 g/dL (ref 1.5–4.5)
Glucose: 100 mg/dL — ABNORMAL HIGH (ref 65–99)
POTASSIUM: 3.9 mmol/L (ref 3.5–5.2)
SODIUM: 142 mmol/L (ref 134–144)
TOTAL PROTEIN: 7.8 g/dL (ref 6.0–8.5)

## 2017-10-31 LAB — CBC
HEMATOCRIT: 42.7 % (ref 34.0–46.6)
Hemoglobin: 14.7 g/dL (ref 11.1–15.9)
MCH: 30.6 pg (ref 26.6–33.0)
MCHC: 34.4 g/dL (ref 31.5–35.7)
MCV: 89 fL (ref 79–97)
Platelets: 318 10*3/uL (ref 150–379)
RBC: 4.81 x10E6/uL (ref 3.77–5.28)
RDW: 13 % (ref 12.3–15.4)
WBC: 10.3 10*3/uL (ref 3.4–10.8)

## 2017-10-31 LAB — TSH: TSH: 1.36 u[IU]/mL (ref 0.450–4.500)

## 2017-10-31 NOTE — Assessment & Plan Note (Signed)
BP elevated on my manual recheck. Suspect combination of both anxiety today and likely longstanding HTN since last pregnancy 6 years ago. Start norvasc as no recent labs, recheck in 2 weeks.

## 2017-10-31 NOTE — Assessment & Plan Note (Addendum)
No SI/HI today but severe symptoms of tearfulness, anorexia, insomnia, fear around driving. Increase sertraline to 75mg , follow up in 2 weeks. Consider psych referral, although patient seems to have concerns around trusting additional providers. Will check TSH to evaluate if thyroid abnormalities could be worsening mood.

## 2017-10-31 NOTE — Assessment & Plan Note (Signed)
Noted this on chart review from previous encounter after patient had left, will recheck at next visit.

## 2017-10-31 NOTE — Telephone Encounter (Deleted)
Called patient to discuss lab findings:

## 2017-10-31 NOTE — Assessment & Plan Note (Signed)
Referred back to gyn for monitoring of ovarian changes.
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
## 2017-10-31 NOTE — Assessment & Plan Note (Signed)
History suggests severe, question prolapse or post surgical anatomical issue although patient hasn't felt this nor been told about it from GYN. Will get UA, culture to rule out concomitant infection. Will also go ahead and refer to urology as patient requests this and is severely debilitated by symptoms.

## 2017-11-01 LAB — URINE CULTURE: Organism ID, Bacteria: NO GROWTH

## 2017-11-02 NOTE — Telephone Encounter (Signed)
Called patient to discuss lab findings. Protein in urine with normal Cr. Will plan to recheck UA at next visit. Otherwise labs normal. She states she was able to pick up norvasc, took first dose yesterday and is feeling well. Reviewed upcoming appts with her.

## 2017-11-06 ENCOUNTER — Encounter: Payer: Self-pay | Admitting: Psychology

## 2017-11-06 ENCOUNTER — Ambulatory Visit: Payer: Medicaid Other | Admitting: Psychology

## 2017-11-06 DIAGNOSIS — F4310 Post-traumatic stress disorder, unspecified: Secondary | ICD-10-CM

## 2017-11-06 NOTE — Patient Instructions (Addendum)
Belicia -- it was good to see you today. As we discussed, I would like you to make an appointment at one of the following providers:  [facility] (there may be a waitlist for Medicaid) [address], 2nd floor [phone] Ask for an intake appointment  [facility] Address: [address] [place], [place] [address]  Hours: Phone: [phone]  [place],  [phone] [phone]  Call or return to see me if there is a problem getting set up with one of these referrals.  -[place]

## 2017-11-06 NOTE — Assessment & Plan Note (Addendum)
Assessment/Plan/Recommendations: The patient has experienced numerous instances of trauma and this seems to be contributing greatly to her symptoms of anxiety. She had a positive screen (77; well above cut-off of 33) on the PCL-5 during her last visit. Today, she had a positive screen on the MDQ, which may indicate that her medication should be changed/augmented (all symptoms endorsed except social, sex). The MDQ was given due to symptom severity and family history of Bipolar Disorder - sister. Upon discussing this positive screen, the patient disclosed that she was diagnosed with Bipolar Disorder 15 years ago by a psychiatrist. She was prescribed Lithium and Seroquel. She found lithium to be helpful, but quit taking it for financial reasons. The patient states that the Seroquel made her feel like a zombie so she discontinued it. She is currently taking Zoloft (75 mg) each day but has noted no improvement in her symptoms. I explained that her complex trauma history and severe symptomatology warrant a referral out for more intensive treatment. Accordingly, I provided her with referral information in her AVS ([facility], [facility] and the [facility]). She has Medicaid, which makes obtaining treatment possible. She will return if there any problems in obtaining a referral. I called after her appointment to check on the status of obtaining a referral. She had not made any calls yet, but promised to do so by the end of the day.

## 2017-11-06 NOTE — Progress Notes (Signed)
Dr. Lindell Noe advised that a referral to a psychiatrist is needed. Accordingly, I called the patient and gave her the following information:  [place]  call [phone]  [facility] Address: [address] [place], [place] [address] Phone: [phone]

## 2017-11-06 NOTE — Progress Notes (Signed)
Reason for follow-up:  [name] returns to discuss her symptoms of anxiety and depression.  Issues discussed:  The patient continues to feel intense anxiety and has difficulty functioning. I asked about her cigarette consumption and she stated that she smokes between half and a full pack of cigarettes each week. Some days she does not smoke at all. We discussed research indicating that nicotine makes anxiety worse. We briefly discussed smoking cessation strategies and I let her know that this is something with which we can help if she chooses.

## 2017-11-22 ENCOUNTER — Ambulatory Visit: Payer: Medicaid Other | Admitting: Family Medicine

## 2017-11-23 ENCOUNTER — Emergency Department (HOSPITAL_COMMUNITY)
Admission: EM | Admit: 2017-11-23 | Discharge: 2017-11-23 | Discharge status: Against Medical Advice | Payer: Medicaid Other

## 2017-11-23 NOTE — ED Triage Notes (Signed)
Pt did not answer when called to be triaged x 1

## 2017-11-23 NOTE — ED Notes (Signed)
Called Pt in lobby to be triaged, no response in lobby x3.

## 2017-11-23 NOTE — ED Notes (Signed)
Called Pt to be triaged, no response in lobby x2.

## 2017-12-04 ENCOUNTER — Other Ambulatory Visit: Payer: Self-pay

## 2017-12-04 ENCOUNTER — Encounter: Payer: Self-pay | Admitting: Family Medicine

## 2017-12-04 ENCOUNTER — Ambulatory Visit (INDEPENDENT_AMBULATORY_CARE_PROVIDER_SITE_OTHER): Payer: Medicaid Other | Admitting: Family Medicine

## 2017-12-04 VITALS — BP 128/84 | HR 75 | Temp 98.5°F | Ht 68.0 in | Wt 204.8 lb

## 2017-12-04 DIAGNOSIS — I10 Essential (primary) hypertension: Secondary | ICD-10-CM

## 2017-12-04 DIAGNOSIS — R32 Unspecified urinary incontinence: Secondary | ICD-10-CM

## 2017-12-04 DIAGNOSIS — E782 Mixed hyperlipidemia: Secondary | ICD-10-CM

## 2017-12-04 DIAGNOSIS — F4310 Post-traumatic stress disorder, unspecified: Secondary | ICD-10-CM

## 2017-12-04 NOTE — Assessment & Plan Note (Signed)
Patient has been seen at [facility], [place], but didn't get antipsychotic. She will call them today to clarify med management.

## 2017-12-04 NOTE — Progress Notes (Signed)
   CC: f/u mood, HTN   HPI  PTSD/bipolar - saw [facility] here, then did walk in at [facility]. Has another appt there on 6/25. Told her to keep doing the zoloft, they added vistaril and she tried it PRN without relief. They wanted to add an antipsychotic, but her pharmacy never received the order. Unsure of the name. She went to [facility] last week for her first intake visit.   Seeing [facility] tomorrow.   HTN - picked up norvasc, has not noted any side effects, BP was better at [facility] too; she is pleased.   Working out - power walk and [employer] and yoga and meditating. This has helped her mood.   HLD - was on "med w a p" before. fam hx of early MI with dad at 61.   Incontinence - persistent, no change in symptoms, denies fever.  ROS: Denies CP, SOB, abdominal pain, dysuria, changes in BMs.   CC, SH/smoking status, and VS noted  Objective: BP 128/84   Pulse 75   Temp 98.5 F (36.9 C) (Oral)   Ht 5\' 8"  (1.727 m)   Wt 204 lb 12.8 oz (92.9 kg)   LMP 01/08/2014   SpO2 98%   BMI 31.14 kg/m  Gen: NAD, alert, cooperative, and pleasant. HEENT: NCAT, EOMI, PERRL CV: RRR, no murmur Resp: CTAB, no wheezes, non-labored Ext: No edema, warm Neuro: Alert and oriented, Speech clear, No gross deficits  Assessment and plan:  HTN (hypertension) BP much improved with decreased emotional stress and norvasc. Continue current plan, recheck 3-6 months.   PTSD (post-traumatic stress disorder) Patient has been seen at [facility], but didn't get antipsychotic. She will call them today to clarify med management.   Incontinence in female Will refer to urology given persistent and history of several surgeries.  Mixed hyperlipidemia Recheck lipids today as these were quite high several years ago and family history of early MI.   Orders Placed This Encounter  Procedures  . Lipid panel  . Ambulatory referral to Urology    Referral Priority:   Routine    Referral Type:   Consultation    Referral  Reason:   Specialty Services Required    Requested Specialty:   Urology    Number of Visits Requested:   1    No orders of the defined types were placed in this encounter.   Ralene Ok, MD, PGY2 12/05/2017 1:31 PM

## 2017-12-04 NOTE — Patient Instructions (Signed)
It was a pleasure to see you today! Thank you for choosing [facility] for your primary care. [name] was seen for HTN, incontinence.   Our plans for today were:  Keep your blood pressure medicine the same, great job with the working out!   Call [facility] today to clarify your medications, and let me know what they start you on.   I will call you or message you about your cholesterol results and if we need to start a new medicine.   You should return to our clinic to see Dr. Lindell Noe in 6 months for HTN.   Best,  Dr. Lindell Noe

## 2017-12-04 NOTE — Assessment & Plan Note (Signed)
BP much improved with decreased emotional stress and norvasc. Continue current plan, recheck 3-6 months.

## 2017-12-05 ENCOUNTER — Other Ambulatory Visit: Payer: Self-pay | Admitting: Family Medicine

## 2017-12-05 DIAGNOSIS — E782 Mixed hyperlipidemia: Secondary | ICD-10-CM

## 2017-12-05 DIAGNOSIS — Z1231 Encounter for screening mammogram for malignant neoplasm of breast: Secondary | ICD-10-CM | POA: Diagnosis not present

## 2017-12-05 LAB — LIPID PANEL
CHOL/HDL RATIO: 7.9 ratio — AB (ref 0.0–4.4)
Cholesterol, Total: 267 mg/dL — ABNORMAL HIGH (ref 100–199)
HDL: 34 mg/dL — AB (ref >39)
LDL CALC: 199 mg/dL — AB (ref 0–99)
Triglycerides: 169 mg/dL — ABNORMAL HIGH (ref 0–149)
VLDL Cholesterol Cal: 34 mg/dL (ref 5–40)

## 2017-12-05 MED ORDER — ATORVASTATIN CALCIUM 40 MG PO TABS: 40.0000 mg | ORAL_TABLET | Freq: Every day | ORAL | 3 refills | Status: DC

## 2017-12-05 NOTE — Assessment & Plan Note (Signed)
Will refer to urology given persistent and history of several surgeries.

## 2017-12-05 NOTE — Assessment & Plan Note (Signed)
Recheck lipids today as these were quite high several years ago and family history of early MI.

## 2017-12-29 DIAGNOSIS — F319 Bipolar disorder, unspecified: Secondary | ICD-10-CM | POA: Diagnosis not present

## 2017-12-29 DIAGNOSIS — F4310 Post-traumatic stress disorder, unspecified: Secondary | ICD-10-CM | POA: Diagnosis not present

## 2018-01-23 DIAGNOSIS — F4310 Post-traumatic stress disorder, unspecified: Secondary | ICD-10-CM | POA: Diagnosis not present

## 2018-01-23 DIAGNOSIS — F319 Bipolar disorder, unspecified: Secondary | ICD-10-CM | POA: Diagnosis not present

## 2018-01-29 DIAGNOSIS — F4310 Post-traumatic stress disorder, unspecified: Secondary | ICD-10-CM | POA: Diagnosis not present

## 2018-01-29 DIAGNOSIS — F319 Bipolar disorder, unspecified: Secondary | ICD-10-CM | POA: Diagnosis not present

## 2018-02-21 ENCOUNTER — Other Ambulatory Visit: Payer: Self-pay

## 2018-02-21 ENCOUNTER — Encounter: Payer: Self-pay | Admitting: Family Medicine

## 2018-02-21 ENCOUNTER — Ambulatory Visit (INDEPENDENT_AMBULATORY_CARE_PROVIDER_SITE_OTHER): Payer: Medicaid Other | Admitting: Family Medicine

## 2018-02-21 VITALS — BP 128/86 | HR 70 | Temp 98.6°F | Ht 68.0 in | Wt 207.4 lb

## 2018-02-21 DIAGNOSIS — Z23 Encounter for immunization: Secondary | ICD-10-CM

## 2018-02-21 DIAGNOSIS — M2550 Pain in unspecified joint: Secondary | ICD-10-CM | POA: Diagnosis not present

## 2018-02-21 DIAGNOSIS — I10 Essential (primary) hypertension: Secondary | ICD-10-CM

## 2018-02-21 DIAGNOSIS — R809 Proteinuria, unspecified: Secondary | ICD-10-CM | POA: Insufficient documentation

## 2018-02-21 LAB — POCT URINALYSIS DIP (MANUAL ENTRY)
BILIRUBIN UA: NEGATIVE mg/dL
Bilirubin, UA: NEGATIVE
Blood, UA: NEGATIVE
Glucose, UA: NEGATIVE mg/dL
LEUKOCYTES UA: NEGATIVE
Nitrite, UA: NEGATIVE
PROTEIN UA: NEGATIVE mg/dL
SPEC GRAV UA: 1.01 (ref 1.010–1.025)
Urobilinogen, UA: 0.2 E.U./dL
pH, UA: 6.5 (ref 5.0–8.0)

## 2018-02-21 NOTE — Assessment & Plan Note (Signed)
Unclear etiology at this time, given patient age and history of chronic urticaria and family history, I do question an autoimmune source of her chronic muscle soreness and tightness.  Will screen with ANA, rheumatoid factor, anti-CCP.  Will refer to rheumatology for further work-up pending these labs.  Will call patient with results.

## 2018-02-21 NOTE — Assessment & Plan Note (Signed)
Improved control today, continue Norvasc

## 2018-02-21 NOTE — Assessment & Plan Note (Signed)
Present on UA at last visit.  Repeat UA today as well as protein creatinine ratio if protein positive.

## 2018-02-21 NOTE — Patient Instructions (Signed)
It was a pleasure to see you today! Thank you for choosing [facility] for your primary care. [name] was seen for muscle tightness.   Our plans for today were:  We will test your urine and blood today to look for autoimmune causes of your muscle pains. I will call you with results.   Best,  Dr. Lindell Noe
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
## 2018-02-23 ENCOUNTER — Telehealth: Payer: Self-pay | Admitting: Family Medicine

## 2018-02-23 DIAGNOSIS — M2550 Pain in unspecified joint: Secondary | ICD-10-CM

## 2018-02-23 LAB — RHEUMATOID FACTOR: Rhuematoid fact SerPl-aCnc: <10 IU/mL (ref 0.0–13.9)

## 2018-02-23 LAB — BASIC METABOLIC PANEL
BUN / CREAT RATIO: 16 (ref 9–23)
BUN: 11 mg/dL (ref 6–24)
CHLORIDE: 102 mmol/L (ref 96–106)
CO2: 21 mmol/L (ref 20–29)
CREATININE: 0.68 mg/dL (ref 0.57–1.00)
Calcium: 9.6 mg/dL (ref 8.7–10.2)
GFR calc Af Amer: 123 mL/min/{1.73_m2} (ref >59)
GFR calc non Af Amer: 107 mL/min/{1.73_m2} (ref >59)
GLUCOSE: 91 mg/dL (ref 65–99)
Potassium: 3.7 mmol/L (ref 3.5–5.2)
Sodium: 139 mmol/L (ref 134–144)

## 2018-02-23 LAB — CYCLIC CITRUL PEPTIDE ANTIBODY, IGG/IGA: Cyclic Citrullin Peptide Ab: 3 units (ref 0–19)

## 2018-02-23 LAB — PROTEIN / CREATININE RATIO, URINE
Creatinine, Urine: 61.3 mg/dL
PROTEIN/CREAT RATIO: 78 mg/g{creat} (ref 0–200)
Protein, Ur: 4.8 mg/dL

## 2018-02-23 LAB — ANA: ANA: NEGATIVE

## 2018-02-23 NOTE — Telephone Encounter (Signed)
Called patient to discuss lab results. All screens for RA and lupus are negative. Her phone did not let me leave a VM. If she returns call, you can tell her the following:  -Your tests for lupus and rheumatoid arthritis were not positive. These are preliminary tests, so we can't say for 100% sure that you don't have an autoimmune component of your muscle and joint pain.  -I think it would be reasonable to have you see a rheumatology doctor to discuss your symptoms and whether they think you need more specific blood testing.   If she wants to do this, please let me know and I will place rheum referral.

## 2018-02-23 NOTE — Telephone Encounter (Signed)
Pt called and message given and she said yes she would like to have a referral placed. [name], [name], [place]

## 2018-03-09 ENCOUNTER — Telehealth: Payer: Self-pay

## 2018-03-09 NOTE — Telephone Encounter (Signed)
Pt called nurse line requesting her pcp to send her in gabapentin. Pt stated I have had this before (did not see on current med list.) Pt stated she is still having nerve ending and joint pain, "I am barely able to function." Please advise. She would like this sent to [facility].

## 2018-03-12 DIAGNOSIS — Z209 Contact with and (suspected) exposure to unspecified communicable disease: Secondary | ICD-10-CM | POA: Diagnosis not present

## 2018-03-14 MED ORDER — GABAPENTIN 100 MG PO CAPS: 100.0000 mg | ORAL_CAPSULE | Freq: Three times a day (TID) | ORAL | 0 refills | Status: DC

## 2018-03-14 NOTE — Addendum Note (Signed)
Addended by: Glenis Smoker on: 03/14/2018 07:20 PM   Modules accepted: Orders

## 2018-03-14 NOTE — Telephone Encounter (Signed)
It's reasonable to try low dose gabapentin for her pain. I will send starting dose, she should follow up with me in 1 month to reassess.

## 2018-03-15 NOTE — Telephone Encounter (Signed)
Tried to contact pt to inform her of below and phone only rang and then said that my call could not be completed at this time. If pt calls back please inform her of below. Katharina Caper, April D, [place]

## 2018-04-11 ENCOUNTER — Other Ambulatory Visit: Payer: Self-pay | Admitting: Family Medicine

## 2018-05-10 ENCOUNTER — Other Ambulatory Visit: Payer: Self-pay | Admitting: Family Medicine

## 2018-05-14 DIAGNOSIS — F4310 Post-traumatic stress disorder, unspecified: Secondary | ICD-10-CM | POA: Diagnosis not present

## 2018-05-14 DIAGNOSIS — F319 Bipolar disorder, unspecified: Secondary | ICD-10-CM | POA: Diagnosis not present

## 2018-05-31 DIAGNOSIS — F4310 Post-traumatic stress disorder, unspecified: Secondary | ICD-10-CM | POA: Diagnosis not present

## 2018-05-31 DIAGNOSIS — F319 Bipolar disorder, unspecified: Secondary | ICD-10-CM | POA: Diagnosis not present

## 2018-06-01 ENCOUNTER — Telehealth: Payer: Self-pay

## 2018-06-01 ENCOUNTER — Other Ambulatory Visit: Payer: Self-pay | Admitting: Family Medicine

## 2018-06-01 NOTE — Telephone Encounter (Signed)
Patient left message asking for dose of Gabapentin to be increased until she sees a new rheumatologist. Has appt with PCP on 07/06/18.  Call back number is [phone]  Danley Danker, RN [facility] [place])

## 2018-06-04 MED ORDER — GABAPENTIN 300 MG PO CAPS: 300.0000 mg | ORAL_CAPSULE | Freq: Three times a day (TID) | ORAL | 0 refills | Status: DC

## 2018-06-04 NOTE — Telephone Encounter (Signed)
I can increase the gabapentin to 300mg  TID, and she should keep her appt with me in January to reassess how this is going and make sure her kidney function is still ok for this dose. The increased dose may make her a little sleepy, so she should start over the next few days with 300mg  at night, and if this makes her really sleepy the next morning she may need to stay at 100mg  in the morning. She should call if she has excessive tiredness and we will make an alternative plan.

## 2018-06-04 NOTE — Telephone Encounter (Signed)
Pt called back and was informed. Torsha Lemus Dawn, CMA  

## 2018-06-04 NOTE — Telephone Encounter (Signed)
LVM for pt to call office back to inform her of below.Please inform her if she calls back.  Katharina Caper, April D, [place]

## 2018-06-10 ENCOUNTER — Other Ambulatory Visit: Payer: Self-pay

## 2018-06-10 ENCOUNTER — Encounter (HOSPITAL_COMMUNITY): Payer: Self-pay | Admitting: Emergency Medicine

## 2018-06-10 ENCOUNTER — Emergency Department (HOSPITAL_COMMUNITY)
Admission: EM | Admit: 2018-06-10 | Discharge: 2018-06-10 | Disposition: A | Discharge status: Home / Self Care | Payer: Medicaid Other | Attending: Emergency Medicine | Admitting: Emergency Medicine

## 2018-06-10 DIAGNOSIS — Z91040 Latex allergy status: Secondary | ICD-10-CM | POA: Insufficient documentation

## 2018-06-10 DIAGNOSIS — T783XXA Angioneurotic edema, initial encounter: Secondary | ICD-10-CM | POA: Diagnosis not present

## 2018-06-10 DIAGNOSIS — Z79899 Other long term (current) drug therapy: Secondary | ICD-10-CM | POA: Insufficient documentation

## 2018-06-10 DIAGNOSIS — F17210 Nicotine dependence, cigarettes, uncomplicated: Secondary | ICD-10-CM | POA: Insufficient documentation

## 2018-06-10 DIAGNOSIS — T782XXA Anaphylactic shock, unspecified, initial encounter: Secondary | ICD-10-CM | POA: Diagnosis not present

## 2018-06-10 DIAGNOSIS — T7800XA Anaphylactic reaction due to unspecified food, initial encounter: Secondary | ICD-10-CM | POA: Diagnosis not present

## 2018-06-10 DIAGNOSIS — J4520 Mild intermittent asthma, uncomplicated: Secondary | ICD-10-CM | POA: Insufficient documentation

## 2018-06-10 DIAGNOSIS — I10 Essential (primary) hypertension: Secondary | ICD-10-CM | POA: Diagnosis not present

## 2018-06-10 DIAGNOSIS — R0902 Hypoxemia: Secondary | ICD-10-CM | POA: Diagnosis not present

## 2018-06-10 DIAGNOSIS — E782 Mixed hyperlipidemia: Secondary | ICD-10-CM | POA: Diagnosis not present

## 2018-06-10 DIAGNOSIS — R1110 Vomiting, unspecified: Secondary | ICD-10-CM | POA: Diagnosis present

## 2018-06-10 DIAGNOSIS — T7840XA Allergy, unspecified, initial encounter: Secondary | ICD-10-CM | POA: Diagnosis not present

## 2018-06-10 DIAGNOSIS — R609 Edema, unspecified: Secondary | ICD-10-CM | POA: Diagnosis not present

## 2018-06-10 MED ORDER — PREDNISONE 20 MG PO TABS
60.0000 mg | ORAL_TABLET | Freq: Once | ORAL | Status: AC
  Administered 2018-06-10: 60 mg via ORAL
  Filled 2018-06-10: qty 3

## 2018-06-10 MED ORDER — PREDNISONE 20 MG PO TABS: ORAL_TABLET | ORAL | 0 refills | Status: DC

## 2018-06-10 MED ORDER — FAMOTIDINE IN NACL 20-0.9 MG/50ML-% IV SOLN: 20.0000 mg | Freq: Once | INTRAVENOUS | Status: DC

## 2018-06-10 MED ORDER — METHYLPREDNISOLONE SODIUM SUCC 125 MG IJ SOLR: 125.0000 mg | Freq: Once | INTRAMUSCULAR | Status: DC

## 2018-06-10 MED ORDER — EPINEPHRINE 0.3 MG/0.3ML IJ SOAJ
0.3000 mg | Freq: Once | INTRAMUSCULAR | Status: DC
  Filled 2018-06-10: qty 0.3

## 2018-06-10 MED ORDER — FAMOTIDINE 20 MG PO TABS
20.0000 mg | ORAL_TABLET | Freq: Once | ORAL | Status: AC
  Administered 2018-06-10: 20 mg via ORAL
  Filled 2018-06-10: qty 1

## 2018-06-10 MED ORDER — EPINEPHRINE PF 1 MG/10ML IJ SOSY: 0.1000 mg | PREFILLED_SYRINGE | Freq: Once | INTRAMUSCULAR | Status: DC

## 2018-06-10 NOTE — ED Notes (Signed)
Patient verbalizes understanding of discharge instructions. Opportunity for questioning and answers were provided. Armband removed by staff, pt discharged from ED ambulatory.   

## 2018-06-10 NOTE — ED Notes (Signed)
ED Provider at bedside. 

## 2018-06-10 NOTE — ED Triage Notes (Signed)
Pt arrives to ED from home with complaints of allergic rxn since this morning. EMS reports pt has referral to autoimmune specialist for this. Pt states her hands started itching and her tongue was swollen. Pt states she took 75mg  of benadryl at home prior to EMS arrival. Pt states she had some trouble swallowing at that time, but currently feels like her swelling is going down. Pt placed in position of comfort with bed locked and lowered, call bell in reach.

## 2018-06-10 NOTE — ED Provider Notes (Signed)
[facility] EMERGENCY DEPARTMENT Provider Note   CSN: [id number] Arrival date & time: 06/10/18  0533     History   Chief Complaint Chief Complaint  Patient presents with  . Allergic Reaction    HPI [name] is a 42 y.o. female.  Patient personal history of "autoimmune disorder" presents by EMS to the ED for hypersensitivity type I-like reaction.  Patient was awoken from sleep with vomiting, swollen tongue, abdominal pain, nausea, diffuse pruritic rash.  Patient denies eating anything abnormal today.  Patient states that she has had episodes like this before in the past.  Patient took 75 mg of Benadryl prior to arrival.  The history is provided by the patient.  Allergic Reaction  Presenting symptoms: itching, rash and swelling   Presenting symptoms comment:  Vomiting, abdominal pain, nausea Severity:  Moderate Duration:  1 hour Prior allergic episodes:  Unable to specify Context comment:  Sleeping at home.  Relieved by:  Nothing Worsened by:  Nothing Ineffective treatments:  Antihistamines   Past Medical History:  Diagnosis Date  . Abnormal uterine bleeding (AUB) 01/21/2014  . Adenomyosis 01/21/2014  . Anxiety    no meds  . Depression    no meds  . Encounter for female sterilization procedure 12/06/2011  . Endometrioma of ovary 08/04/2015  . Fibroids   . Fibromyalgia    no meds  . History of preterm labor   . History of recurrent UTI (urinary tract infection)   . Intramural leiomyoma of uterus 01/21/2014  . Mild preeclampsia 09/20/2011   Hix with 2013 pregnancy  PIH - resolved, no longer an issue  . Obese   . Pelvic pain in female 01/21/2014  . Polyhydramnios 09/20/2011  . S/P cesarean section 09/20/2011  . S/P tubal ligation 12/07/2011  . SVD (spontaneous vaginal delivery)    x 2  . Uterine fibroids affecting pregnancy   . Vaginal delivery 1995, 1998    Patient Active Problem List   Diagnosis Date Noted  . Proteinuria 02/21/2018  . Arthralgia of  multiple joints 02/21/2018  . Major depression, recurrent ([place]) 10/31/2017  . HTN (hypertension) 10/31/2017  . Incontinence in female 10/31/2017  . Mixed hyperlipidemia 10/31/2017  . PTSD (post-traumatic stress disorder) 10/30/2017  . Allergic reaction 09/29/2015  . Perennial and seasonal allergic rhinitis 09/29/2015  . Mild intermittent asthma 09/29/2015  . Endometrioma of ovary 08/04/2015  . S/P laparoscopic assisted vaginal hysterectomy (LAVH) 01/22/2014  . Intramural leiomyoma of uterus 01/21/2014  . Adenomyosis 01/21/2014  . Abnormal uterine bleeding (AUB) 01/21/2014  . S/P tubal ligation 12/07/2011    Past Surgical History:  Procedure Laterality Date  . CESAREAN SECTION  09/20/2011   Procedure: CESAREAN SECTION;  Surgeon: Thornell Sartorius, MD;  Location: [facility];  Service: Gynecology;  Laterality: N/A;  . CHOLECYSTECTOMY     Lap  . KNEE SURGERY     right - dislocated knee cap  . LAPAROSCOPIC ASSISTED VAGINAL HYSTERECTOMY N/A 01/22/2014   Procedure: LAPAROSCOPIC ASSISTED VAGINAL HYSTERECTOMY, Bilateral Salpingectomy;  Surgeon: Janyth Contes, MD;  Location: [facility];  Service: Gynecology;  Laterality: N/A;  . LAPAROSCOPIC LYSIS OF ADHESIONS  08/05/2015   Procedure: LAPAROSCOPIC LYSIS OF ADHESIONS;  Surgeon: Janyth Contes, MD;  Location: [facility];  Service: Gynecology;;  . LAPAROSCOPIC OVARIAN CYSTECTOMY  08/05/2015   Procedure: LAPAROSCOPY;  Surgeon: Janyth Contes, MD;  Location: [facility];  Service: Gynecology;;  . LAPAROSCOPIC TUBAL LIGATION  12/07/2011   Procedure: LAPAROSCOPIC TUBAL LIGATION;  Surgeon: Thornell Sartorius, MD;  Location: [facility];  Service: Gynecology;  Laterality: Bilateral;  . OOPHORECTOMY Right 08/05/2015   Procedure: OOPHORECTOMY;  Surgeon: Janyth Contes, MD;  Location: [facility];  Service: Gynecology;  Laterality: Right;     OB History    Gravida  4   Para  3   Term  1   Preterm  2   AB  1   Living  3     SAB  1   TAB  0   Ectopic  0    Multiple  0   Live Births  3            Home Medications    Prior to Admission medications   Medication Sig Start Date End Date Taking? Authorizing Provider  acetaminophen (TYLENOL) 325 MG tablet Take 325 mg by mouth every 6 (six) hours as needed for mild pain.    [provider]  amLODipine (NORVASC) 5 MG tablet Take 1 tablet (5 mg total) by mouth daily. 10/30/17   Sela Hilding, MD  amoxicillin-clavulanate (AUGMENTIN) 875-125 MG tablet Take 1 tablet by mouth 2 (two) times daily. 08/23/16   Barnet Glasgow, NP  atorvastatin (LIPITOR) 40 MG tablet Take 1 tablet (40 mg total) by mouth daily. 12/05/17   Sela Hilding, MD  benzonatate (TESSALON) 100 MG capsule Take 1 capsule (100 mg total) by mouth every 8 (eight) hours. 08/23/16   Barnet Glasgow, NP  cetirizine (ZYRTEC) 10 MG tablet TAKE ONE TABLET DAILY AS DIRECTED 05/06/16   Bobbitt, Sedalia Muta, MD  EPINEPHrine 0.3 mg/0.3 mL IJ SOAJ injection USE AS DIRECTED FOR LIFE THREATENING ALLERGIC REACTIONS 09/29/15   Bobbitt, Sedalia Muta, MD  gabapentin (NEURONTIN) 300 MG capsule Take 1 capsule (300 mg total) by mouth 3 (three) times daily. 06/04/18   Sela Hilding, MD  ibuprofen (ADVIL,MOTRIN) 800 MG tablet Take 1 tablet (800 mg total) by mouth every 8 (eight) hours as needed for fever or moderate pain (mild pain). 08/05/15   Bovard-Stuckert, Jeral Fruit, MD  levocetirizine (XYZAL) 5 MG tablet TAKE ONE TABLET DAILY AS DIRECTED 09/29/15   Bobbitt, Sedalia Muta, MD  Multiple Vitamin (MULTIVITAMIN WITH MINERALS) TABS tablet Take 1 tablet by mouth daily.    [provider]  pravastatin (PRAVACHOL) 40 MG tablet Take 1 tablet (40 mg total) by mouth daily. 07/06/15   Micheline Chapman, NP  predniSONE (DELTASONE) 50 MG tablet Take 1 tablet daily with food 08/23/16   Barnet Glasgow, NP  sertraline (ZOLOFT) 50 MG tablet Take 50 mg by mouth daily.    [provider]    Family History Family History  Problem Relation  Age of Onset  . Cancer Mother        breast  . Hypertension Mother   . Heart disease Father   . Hypertension Father   . Asthma Sister   . Mental illness Sister        bipolar, schizophrenia,   . Ovarian cysts Sister   . Seizures Sister   . Allergic rhinitis Sister   . Diabetes Maternal Grandmother   . Depression Maternal Grandfather        suicide    Social History Social History   Tobacco Use  . Smoking status: Current Some Day Smoker    Packs/day: 0.00    Types: Cigarettes  . Smokeless tobacco: Never Used  Substance Use Topics  . Alcohol use: Yes    Comment: Socially  . Drug use: No     Allergies   Latex and  Hydrocodone-acetaminophen   Review of Systems Review of Systems  Skin: Positive for itching and rash.     Physical Exam Updated Vital Signs BP (!) 125/94   Pulse 72   Temp (!) 97.4 F (36.3 C) (Oral)   Resp 16   LMP 01/08/2014   SpO2 99%   Physical Exam Constitutional:      Appearance: She is obese. She is not ill-appearing.  HENT:     Head:     Comments: Facial swelling    Mouth/Throat:     Comments: Macroglossia Neck:     Musculoskeletal: Normal range of motion and neck supple.  Cardiovascular:     Rate and Rhythm: Normal rate and regular rhythm.  Pulmonary:     Effort: Pulmonary effort is normal.     Breath sounds: Normal breath sounds.  Abdominal:     General: Abdomen is flat. There is no distension.  Musculoskeletal: Normal range of motion.  Skin:    Comments: Diffuse hives with excoriations due to scratching from patient's head to lower extremities and across trunk  Neurological:     General: No focal deficit present.     Mental Status: She is alert.  Psychiatric:        Mood and Affect: Mood normal.        Behavior: Behavior normal.      ED Treatments / Results  Labs (all labs ordered are listed, but only abnormal results are displayed) Labs Reviewed - No data to display  EKG None  Radiology No results  found.  Procedures Procedures (including critical care time)  Medications Ordered in ED Medications  EPINEPHrine (EPI-PEN) injection 0.3 mg (has no administration in time range)  famotidine (PEPCID) tablet 20 mg (has no administration in time range)  predniSONE (DELTASONE) tablet 60 mg (has no administration in time range)     Initial Impression / Assessment and Plan / ED Course  I have reviewed the triage vital signs and the nursing notes.  Pertinent labs & imaging results that were available during my care of the patient were reviewed by me and considered in my medical decision making (see chart for details).     Patient presenting with type I hypersensitivity reaction with GI and skin involvement.  Will treat with epinephrine, prednisone, famotidine and monitor for 4 hours.   Interval update Signed out to Dr. Tyrone Nine  Final Clinical Impressions(s) / ED Diagnoses   Final diagnoses:  Anaphylaxis, initial encounter    ED Discharge Orders    None       Bonnita Hollow, MD 06/10/18 [place], [place], DO 06/10/18 [phone]

## 2018-07-02 ENCOUNTER — Other Ambulatory Visit: Payer: Self-pay | Admitting: Family Medicine

## 2018-07-06 ENCOUNTER — Encounter: Payer: Self-pay | Admitting: Family Medicine

## 2018-07-06 ENCOUNTER — Ambulatory Visit: Payer: Medicaid Other | Admitting: Family Medicine

## 2018-07-06 ENCOUNTER — Other Ambulatory Visit: Payer: Self-pay

## 2018-07-06 VITALS — BP 128/96 | HR 79 | Temp 98.1°F | Ht 68.0 in | Wt 212.8 lb

## 2018-07-06 DIAGNOSIS — M2550 Pain in unspecified joint: Secondary | ICD-10-CM

## 2018-07-06 DIAGNOSIS — I10 Essential (primary) hypertension: Secondary | ICD-10-CM

## 2018-07-06 DIAGNOSIS — L509 Urticaria, unspecified: Secondary | ICD-10-CM | POA: Diagnosis not present

## 2018-07-06 MED ORDER — GABAPENTIN 300 MG PO CAPS
600.0000 mg | ORAL_CAPSULE | Freq: Two times a day (BID) | ORAL | 2 refills | Status: DC
Start: 2018-07-06 — End: 2018-10-19

## 2018-07-06 NOTE — Progress Notes (Signed)
   CC: HTN, chronic pain  HPI  HTN - BP was up at [facility], 169/90 at that visit. They encouraged her to call me.   Stress- seeing therapist and [facility]. Things seem to be going well there. Increased zoloft to 100mg . Added latuda, topomax. Anxiety is buspar and hydroxizine as needed.   Pain - taking gabapentin. Worse in cold weather. Days where it is hard for her to get out of bed. Pain with in shoulders, neck, back, hips. Gabapentin helps her get up and start moving. Stress seems to exacerbate this. Has been taking gabapentin 600mg  qAM and 300-600mg  every afternoon. Weather seems to be exacerbating this.   ROS: Denies CP, SOB, abdominal pain, dysuria, changes in BMs.   CC, SH/smoking status, and VS noted  Objective: BP (!) 128/96   Pulse 79   Temp 98.1 F (36.7 C) (Oral)   Ht 5\' 8"  (1.727 m)   Wt 212 lb 12.8 oz (96.5 kg)   LMP 01/08/2014   SpO2 97%   BMI 32.36 kg/m  Gen: NAD, alert, cooperative, and pleasant. HEENT: NCAT, EOMI, PERRL CV: RRR, no murmur Resp: CTAB, no wheezes, non-labored Ext: No edema, warm Neuro: Alert and oriented, Speech clear, No gross deficits  Assessment and plan:  HTN (hypertension) Stable today, suspect elevation at [facility] was 2/2 stress. Continue to monitor, continue norvasc.   Arthralgia of multiple joints Continued unclear etiology. Glad gabapentin is helping. Patient was taking 600mg  BID, which is reasonable. Check BMP to ensure stable renal function. Replaced referral to rheumatology as there were some scheduling complications previously I discussed this with Kennyth Lose and she recommended this was the most expedient way to get the patient an appointment.   Orders Placed This Encounter  Procedures  . Basic metabolic panel  . Ambulatory referral to Rheumatology    Referral Priority:   Routine    Referral Type:   Consultation    Referral Reason:   Specialty Services Required    Requested Specialty:   Rheumatology    Number of Visits  Requested:   1    Meds ordered this encounter  Medications  . gabapentin (NEURONTIN) 300 MG capsule    Sig: Take 2 capsules (600 mg total) by mouth 2 (two) times daily for 30 days.    Dispense:  120 capsule    Refill:  2    Ralene Ok, MD, PGY3 07/09/2018 10:32 AM

## 2018-07-06 NOTE — Patient Instructions (Signed)
It was a pleasure to see you today! Thank you for choosing [facility] for your primary care. [name] was seen for HTN, arthralgia.   Our plans for today were:  Keep your blood pressure medicine the same.   You are doing fine with the gabapentin, I will check your kidney function and send a refill for 600mg  twice per day.   Keep up the good work with [employer].   Best,  Dr. Lindell Noe

## 2018-07-07 LAB — BASIC METABOLIC PANEL
BUN/Creatinine Ratio: 13 (ref 9–23)
BUN: 9 mg/dL (ref 6–24)
CHLORIDE: 104 mmol/L (ref 96–106)
CO2: 19 mmol/L — AB (ref 20–29)
CREATININE: 0.69 mg/dL (ref 0.57–1.00)
Calcium: 9.5 mg/dL (ref 8.7–10.2)
GFR calc Af Amer: 122 mL/min/{1.73_m2} (ref >59)
GFR calc non Af Amer: 106 mL/min/{1.73_m2} (ref >59)
GLUCOSE: 91 mg/dL (ref 65–99)
POTASSIUM: 3.7 mmol/L (ref 3.5–5.2)
Sodium: 141 mmol/L (ref 134–144)

## 2018-07-09 ENCOUNTER — Telehealth: Payer: Self-pay | Admitting: *Deleted

## 2018-07-09 NOTE — Telephone Encounter (Signed)
Pt informed of below. Zimmerman Rumple, Arelene Moroni D, CMA  

## 2018-07-09 NOTE — Assessment & Plan Note (Addendum)
Stable today, suspect elevation at [facility] was 2/2 stress. Continue to monitor, continue norvasc.

## 2018-07-09 NOTE — Telephone Encounter (Signed)
-----   Message from Sela Hilding, MD sent at 07/09/2018 10:32 AM EST ----- Please let patient know her kidney numbers look excellent, and she can continue using the gabapentin at 600 mg twice per day.  No changes needed.

## 2018-07-09 NOTE — Assessment & Plan Note (Signed)
Continued unclear etiology. Glad gabapentin is helping. Patient was taking 600mg  BID, which is reasonable. Check BMP to ensure stable renal function. Replaced referral to rheumatology as there were some scheduling complications previously I discussed this with Kennyth Lose and she recommended this was the most expedient way to get the patient an appointment.

## 2018-08-17 DIAGNOSIS — F319 Bipolar disorder, unspecified: Secondary | ICD-10-CM | POA: Diagnosis not present

## 2018-08-17 DIAGNOSIS — F4310 Post-traumatic stress disorder, unspecified: Secondary | ICD-10-CM | POA: Diagnosis not present

## 2018-08-17 DIAGNOSIS — F411 Generalized anxiety disorder: Secondary | ICD-10-CM | POA: Diagnosis not present

## 2018-09-18 DIAGNOSIS — F319 Bipolar disorder, unspecified: Secondary | ICD-10-CM | POA: Diagnosis not present

## 2018-10-16 DIAGNOSIS — F319 Bipolar disorder, unspecified: Secondary | ICD-10-CM | POA: Diagnosis not present

## 2018-10-18 ENCOUNTER — Other Ambulatory Visit: Payer: Self-pay | Admitting: Family Medicine

## 2018-11-09 DIAGNOSIS — F4310 Post-traumatic stress disorder, unspecified: Secondary | ICD-10-CM | POA: Diagnosis not present

## 2018-11-09 DIAGNOSIS — F319 Bipolar disorder, unspecified: Secondary | ICD-10-CM | POA: Diagnosis not present

## 2018-11-09 DIAGNOSIS — F411 Generalized anxiety disorder: Secondary | ICD-10-CM | POA: Diagnosis not present

## 2018-11-15 ENCOUNTER — Other Ambulatory Visit: Payer: Self-pay | Admitting: Family Medicine

## 2019-01-01 ENCOUNTER — Telehealth: Payer: Self-pay | Admitting: *Deleted

## 2019-01-01 NOTE — Telephone Encounter (Signed)
Pt states that she has been taking her gabapentin at night as well (taking a total of three a day).  She is requesting a refill of this increased dose before her appt. Christen Bame, CMA

## 2019-01-03 MED ORDER — GABAPENTIN 300 MG PO CAPS: ORAL_CAPSULE | ORAL | 2 refills | Status: DC

## 2019-01-03 NOTE — Telephone Encounter (Signed)
Pt calling to check status. Ace Bergfeld, CMA  

## 2019-01-03 NOTE — Telephone Encounter (Signed)
I have refilled her medicine as previously prescribed. She has increased this medication greatly over the last 6 months, and would like to evaluate her at our  appt on the 29th. I did not change dosing. This Rx should get her through to next week.  Please call patient to let her know that the Rx has been filled.

## 2019-01-04 NOTE — Telephone Encounter (Signed)
LM with female to have pt call office back to inform her of below, also LVM on mobile as well.Marland KitchenApril Zimmerman Rumple, CMA

## 2019-01-10 NOTE — Telephone Encounter (Signed)
Pt informed of below.[name], CMA ? ?

## 2019-01-16 ENCOUNTER — Other Ambulatory Visit: Payer: Self-pay

## 2019-01-16 ENCOUNTER — Encounter: Payer: Self-pay | Admitting: Family Medicine

## 2019-01-16 ENCOUNTER — Ambulatory Visit (INDEPENDENT_AMBULATORY_CARE_PROVIDER_SITE_OTHER): Payer: Medicaid Other | Admitting: Family Medicine

## 2019-01-16 VITALS — BP 132/96 | HR 81 | Wt 230.8 lb

## 2019-01-16 DIAGNOSIS — E782 Mixed hyperlipidemia: Secondary | ICD-10-CM | POA: Diagnosis not present

## 2019-01-16 DIAGNOSIS — L509 Urticaria, unspecified: Secondary | ICD-10-CM

## 2019-01-16 DIAGNOSIS — I10 Essential (primary) hypertension: Secondary | ICD-10-CM | POA: Diagnosis not present

## 2019-01-16 DIAGNOSIS — M2550 Pain in unspecified joint: Secondary | ICD-10-CM | POA: Diagnosis not present

## 2019-01-16 LAB — POCT SEDIMENTATION RATE: POCT SED RATE: 3 mm/hr (ref 0–22)

## 2019-01-16 MED ORDER — GABAPENTIN 600 MG PO TABS: 600.0000 mg | ORAL_TABLET | Freq: Three times a day (TID) | ORAL | 5 refills | Status: DC

## 2019-01-16 NOTE — Patient Instructions (Signed)
Dear [name],   It was good to see you! Thank you for taking your time to come in to be seen. Today, we discussed the following:   Pain   Increased gabapentin to three times a day   Ruling out Autoimmune   I will follow up with you about your tests   Be well,   Zettie Cooley, M.D   [place] [phone]  *Sign up for MyChart for instant access to your health profile, labs, orders, upcoming appointments or to contact your provider with questions*  =================================================================================== Some important items for [name]'s health:    Your Blood Pressure.  Should be LESS THAN 140/90 or 150/90 if you are over 65 BP Readings from Last 3 Encounters:  01/16/19 (!) 132/96  07/06/18 (!) 128/96  06/10/18 (!) 141/84    Your Weight History Wt Readings from Last 3 Encounters:  01/16/19 230 lb 12.8 oz (104.7 kg)  07/06/18 212 lb 12.8 oz (96.5 kg)  02/21/18 207 lb 6.4 oz (94.1 kg)    Body mass index is 35.09 kg/m.  BMI Classes Classification BMI Category (kg/m2)  Underweight < 18.5  Normal Weight 18.5-24.9  Overweight  25.0-29.9  Obese Class I 30.0-34.9  Obese Class II 35.0-39.9  Obese Class III  > or  = 40.0      Your last A1C     Every 3-6 months if you have diabetes No results found for: HGBA1C  Your last Cholesterol   Every 1-5 years    Component Value Date/Time   CHOL 267 (H) 12/04/2017 1106   HDL 34 (L) 12/04/2017 1106   LDLCALC 199 (H) 12/04/2017 1106    Your last Blood Tests -  Once a year if you take medications    Component Value Date/Time   K 3.7 07/06/2018 1134   CREATININE 0.69 07/06/2018 1134   CREATININE 0.57 07/02/2015 0900   GLUCOSE 91 07/06/2018 1134   GLUCOSE 114 (H) 08/07/2016 2245    To Keep You Healthy Your are due for the following Health Maintenance Items:  Health Maintenance Due  Topic Date Due  . PAP SMEAR-Modifier  10/26/1994    Please schedule an appointment with your  healthcare provider for any questions or concerns regarding your health or any of the items above.
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
## 2019-01-17 LAB — BASIC METABOLIC PANEL
BUN/Creatinine Ratio: 10 (ref 9–23)
BUN: 6 mg/dL (ref 6–24)
CO2: 21 mmol/L (ref 20–29)
Calcium: 9.6 mg/dL (ref 8.7–10.2)
Chloride: 100 mmol/L (ref 96–106)
Creatinine, Ser: 0.62 mg/dL (ref 0.57–1.00)
GFR calc Af Amer: 126 mL/min/{1.73_m2} (ref >59)
GFR calc non Af Amer: 109 mL/min/{1.73_m2} (ref >59)
Glucose: 69 mg/dL (ref 65–99)
Potassium: 4.2 mmol/L (ref 3.5–5.2)
Sodium: 137 mmol/L (ref 134–144)

## 2019-01-17 LAB — TSH: TSH: 1.38 u[IU]/mL (ref 0.450–4.500)

## 2019-01-17 LAB — C-REACTIVE PROTEIN: CRP: 7 mg/L (ref 0–10)

## 2019-01-20 ENCOUNTER — Encounter: Payer: Self-pay | Admitting: Family Medicine

## 2019-01-20 DIAGNOSIS — L509 Urticaria, unspecified: Secondary | ICD-10-CM | POA: Insufficient documentation

## 2019-01-20 NOTE — Assessment & Plan Note (Signed)
Missed lipid testing today as we spoke about many other things.  Will order standing lab for patient to return for lipid panel as these have been very elevated in the past.  Patient is currently on atorvastatin 40 mg, and cannot go any higher, but can also add other supplements to adjunct.   -- encouraged weight loss and exercise  -- follow up repeat

## 2019-01-20 NOTE — Assessment & Plan Note (Signed)
Patient told in the past that she has an autoimmune disorder and she would like further work-up.  Dr. Lindell Noe previously planned rheumatoid arthritis testing given patient's joint pain and all of those results returned negative.  I collected an ESR and CRP on the day of this appointment and both of those have also come back negative.  I think this patient would benefit from further work-up at rheumatology if she continues to have joint pains.  She will also continue to follow at [facility].

## 2019-01-20 NOTE — Assessment & Plan Note (Addendum)
Continues to have unclear etiology.  Patient requests to increase her gabapentin today to 600 mg 3 times daily.  She reports that she takes it as soon as she wakes up in the morning, in the afternoon and it is worn off by night.  She reports that it has been very difficult for her to sleep without having it in her system. --We will increase gabapentin to 3 times daily

## 2019-01-20 NOTE — Assessment & Plan Note (Signed)
Patient's blood pressures have been stable at the most recent visit.  They have been mildly elevated but rarely over 140/90.  She continues to take Norvasc 5 mg daily.  We will not make any changes today.

## 2019-07-01 ENCOUNTER — Other Ambulatory Visit: Payer: Self-pay | Admitting: Family Medicine

## 2019-08-29 ENCOUNTER — Other Ambulatory Visit: Payer: Self-pay

## 2019-08-30 MED ORDER — ATORVASTATIN CALCIUM 40 MG PO TABS: 40.0000 mg | ORAL_TABLET | Freq: Every day | ORAL | 0 refills | Status: DC

## 2019-08-30 MED ORDER — AMLODIPINE BESYLATE 5 MG PO TABS: 5.0000 mg | ORAL_TABLET | Freq: Every day | ORAL | 0 refills | Status: DC

## 2019-08-30 NOTE — Telephone Encounter (Signed)
Patient needs annual visit for chronic disease management prior to any further future orders. Filled Rxes for one month.

## 2019-10-07 ENCOUNTER — Encounter: Payer: Self-pay | Admitting: Family Medicine

## 2019-10-07 ENCOUNTER — Other Ambulatory Visit: Payer: Self-pay

## 2019-10-07 ENCOUNTER — Ambulatory Visit (INDEPENDENT_AMBULATORY_CARE_PROVIDER_SITE_OTHER): Payer: Medicaid Other | Admitting: Family Medicine

## 2019-10-07 VITALS — BP 138/82 | HR 80 | Ht 68.0 in | Wt 228.0 lb

## 2019-10-07 DIAGNOSIS — R809 Proteinuria, unspecified: Secondary | ICD-10-CM

## 2019-10-07 DIAGNOSIS — I10 Essential (primary) hypertension: Secondary | ICD-10-CM

## 2019-10-07 DIAGNOSIS — Z6834 Body mass index (BMI) 34.0-34.9, adult: Secondary | ICD-10-CM | POA: Diagnosis not present

## 2019-10-07 DIAGNOSIS — E782 Mixed hyperlipidemia: Secondary | ICD-10-CM | POA: Diagnosis not present

## 2019-10-07 DIAGNOSIS — F332 Major depressive disorder, recurrent severe without psychotic features: Secondary | ICD-10-CM

## 2019-10-07 DIAGNOSIS — Z1231 Encounter for screening mammogram for malignant neoplasm of breast: Secondary | ICD-10-CM | POA: Diagnosis not present

## 2019-10-07 DIAGNOSIS — F419 Anxiety disorder, unspecified: Secondary | ICD-10-CM | POA: Diagnosis not present

## 2019-10-07 DIAGNOSIS — Z0000 Encounter for general adult medical examination without abnormal findings: Secondary | ICD-10-CM | POA: Diagnosis not present

## 2019-10-07 LAB — POCT GLYCOSYLATED HEMOGLOBIN (HGB A1C): Hemoglobin A1C: 5.6 % (ref 4.0–5.6)

## 2019-10-07 MED ORDER — CLONAZEPAM 0.5 MG PO TABS: 0.5000 mg | ORAL_TABLET | ORAL | 0 refills | Status: DC | PRN

## 2019-10-07 NOTE — Progress Notes (Addendum)
    SUBJECTIVE:   CHIEF COMPLAINT / HPI:   Anxiety  Has a lot on her plate with her kids and a second cancer diagnosis and her mother.  Patient reports that her anxiety is unbearable.  She currently receives her care at [facility] and is taking hydroxyzine PRN(rx'ed by OBGYN).she also takes sertraline (taking since 12 for depression) and Latuda (for depression) both prescribed by [employer].  Patient reports that she follows with a psychiatrist and therapist.  She has not seen her therapist in several months due to Covid.  Patient reports that she takes her sisters Xanax to help her feel better when she is very overwhelmed.  She takes Xanax about 3 times a month.  She reports that she is an adult and can get her own Xanax prescription. When feeling very overwhelmed, goes for walks, sit by herself.  Patient reports that she has panic attacks and moments where she feels She denies any SI.  Patient denies any chest pain, shortness of breath, lightheadedness, dizziness.  PERTINENT  PMH / PSH: Depression, PTSD, hypertension, chronic pain  OBJECTIVE:   BP 138/82   Pulse 80   Ht 5\' 8"  (1.727 m)   Wt 228 lb (103.4 kg)   LMP 01/08/2014   SpO2 97%   BMI 34.67 kg/m   General: Well-appearing, mildly distressed and tearful  ASSESSMENT/PLAN:   Major depression, recurrent ([place]) Continuing her care at [facility].  Encourage patient to increase visits with her therapist to help her with her recent increased anxiety.  Mixed hyperlipidemia Repeat lipid panel today.  Patient to continue 40 mg atorvastatin daily  Proteinuria Checking UPC today.  Patient with hypertension but not currently on ACE or ARB with history of proteinuria.  Anxiety Patient reporting acutely increased anxiety recently.  She describes having a lot of life stressors.  Talk to patient at length about benzodiazepine use and acute anxiety as it does not help to fix the problem.  Patient seems doubtful that she will ever get over anxiety as  she has a very strong family history of it.  Showed patient how to do the senses exercise when she is feeling panicked.  Expressed importance of seeing more often therapy to find different ways of dealing with her stress as benzodiazepines will not fix it.  Additionally, provided patient with list of providers for mental health who take Medicaid or do not require insurance.  If [facility] cannot see her more often, she should find someone on this list to follow with. Prescribing 0.5 mg of Klonopin #10 today for severe episodes of anxiety or panic attacks.  Made very clear that I will not be refilling this prescription and that she can use this while finding a therapist or increasing her [place] visits and until she follows with her psychiatrist at the end of the month for possible medication changes. (Patient notes that she was unable to get an appointment at [facility] sooner which is why she came today). Advised patient not to take her sister's prescription medication as it is not prescribed for her.  PDMP reviewed. No red flags.   HTN (hypertension) 138/82 in clinic today.  Obtaining BMP today  Healthcare maintenance Patient is status post hysterectomy and follows with an OB/GYN for her care.     Wilber Oliphant, MD [place]

## 2019-10-07 NOTE — Patient Instructions (Addendum)
Dear [name],   It was good to see you! Thank you for taking your time to come in to be seen. Today, we discussed the following:   Annual Exam   Call your therapist and start seeing them more often. I also attached a list of people to see.   Please follow up in 2-3 weeks  You are due for the following Health Maintenance items. Please schedule an appointment to address these prior to leaving.   Be well,   Zettie Cooley, M.D   [facility] [phone]  *Sign up for MyChart for instant access to your health profile, labs, orders, upcoming appointments or to contact your provider with questions*  ===================================================================================   Outpatient Mental Health Providers (No Insurance required or Self Pay)  [facility] Mon-Fri, 8:30-5:00  [address] [place], [place] [address]    [phone] [email or web address]  To schedule an appointment call [phone] (Immediate assistance)  [place] Mon-Fri, 8am-3pm [email or web address] [address], [place], [place]   [place] [phone]  [facility]   Walk-in-Clinic: Monday- Friday 9:00 AM - 4:00 PM [place], [place] [phone]  [facility] [place] ([place]) walk in M-F 8am-12pm and  1pm-3pm [place]  [place]  Phone: [phone]  [employer] ([place] and substance challenges) [address], [place] [phone]    [email or web address] .[place] of the [place], [place]     Phone:  [phone] [place]-  [place]  [place]  [place]  [phone] [email or web address]    [facility] ( virtual or zoom therapy) [email or web address]  [place]  [place]    [facility] [phone]  grief counseling, dementia and caregiver support    [facility] Walk-in MWF 12:30 to 3:00     [place] [address]  [phone]  [email or web address] call to schedule an appointment    [place] ,Support group, Peer support services, [address], [place], [place] [address] 336- [phone]  [email or web address]           National Alliance on Mental Illness (NAMI) [place]- Wellness classes, Support groups        [address] [place], [place] [address] [phone]   CurrentJokes.[email or web address]   [facility]  (Psycho-social Rehabilitation clubhouse, Individual and group therapy) [address], [place] [address]   [phone]  24- Hour Availability:  *[place] or [phone] * [facility] the [employer] (Domestic Violence, Rape, etc. )[phone] [place] [phone] or [phone] * [place] [phone] only) [phone] (after hours) *[facility] Crisis [facility] [phone] *[place] National Suicide Hotline [phone] Diamantina Monks)

## 2019-10-08 ENCOUNTER — Other Ambulatory Visit: Payer: Self-pay | Admitting: Family Medicine

## 2019-10-08 DIAGNOSIS — Z0000 Encounter for general adult medical examination without abnormal findings: Secondary | ICD-10-CM | POA: Insufficient documentation

## 2019-10-08 DIAGNOSIS — F419 Anxiety disorder, unspecified: Secondary | ICD-10-CM | POA: Insufficient documentation

## 2019-10-08 LAB — COMPREHENSIVE METABOLIC PANEL
ALT: 27 IU/L (ref 0–32)
AST: 20 IU/L (ref 0–40)
Albumin/Globulin Ratio: 1.8 (ref 1.2–2.2)
Albumin: 4.8 g/dL (ref 3.8–4.8)
Alkaline Phosphatase: 83 IU/L (ref 39–117)
BUN/Creatinine Ratio: 9 (ref 9–23)
BUN: 6 mg/dL (ref 6–24)
Bilirubin Total: 0.2 mg/dL (ref 0.0–1.2)
CO2: 21 mmol/L (ref 20–29)
Calcium: 10 mg/dL (ref 8.7–10.2)
Chloride: 104 mmol/L (ref 96–106)
Creatinine, Ser: 0.69 mg/dL (ref 0.57–1.00)
GFR calc Af Amer: 122 mL/min/{1.73_m2} (ref >59)
GFR calc non Af Amer: 105 mL/min/{1.73_m2} (ref >59)
Globulin, Total: 2.7 g/dL (ref 1.5–4.5)
Glucose: 68 mg/dL (ref 65–99)
Potassium: 4.3 mmol/L (ref 3.5–5.2)
Sodium: 141 mmol/L (ref 134–144)
Total Protein: 7.5 g/dL (ref 6.0–8.5)

## 2019-10-08 LAB — LIPID PANEL
Chol/HDL Ratio: 5.8 ratio — ABNORMAL HIGH (ref 0.0–4.4)
Cholesterol, Total: 214 mg/dL — ABNORMAL HIGH (ref 100–199)
HDL: 37 mg/dL — ABNORMAL LOW (ref >39)
LDL Chol Calc (NIH): 127 mg/dL — ABNORMAL HIGH (ref 0–99)
Triglycerides: 283 mg/dL — ABNORMAL HIGH (ref 0–149)
VLDL Cholesterol Cal: 50 mg/dL — ABNORMAL HIGH (ref 5–40)

## 2019-10-08 NOTE — Assessment & Plan Note (Signed)
Repeat lipid panel today.  Patient to continue 40 mg atorvastatin daily

## 2019-10-08 NOTE — Assessment & Plan Note (Addendum)
Patient reporting acutely increased anxiety recently.  She describes having a lot of life stressors.  Talk to patient at length about benzodiazepine use and acute anxiety as it does not help to fix the problem.  Patient seems doubtful that she will ever get over anxiety as she has a very strong family history of it.  Showed patient how to do the senses exercise when she is feeling panicked.  Expressed importance of seeing more often therapy to find different ways of dealing with her stress as benzodiazepines will not fix it.  Additionally, provided patient with list of providers for mental health who take Medicaid or do not require insurance.  If [facility] cannot see her more often, she should find someone on this list to follow with. Prescribing 0.5 mg of Klonopin #10 today for severe episodes of anxiety or panic attacks.  Made very clear that I will not be refilling this prescription and that she can use this while finding a therapist or increasing her [place] visits and until she follows with her psychiatrist at the end of the month for possible medication changes. (Patient notes that she was unable to get an appointment at [facility] sooner which is why she came today). Advised patient not to take her sister's prescription medication as it is not prescribed for her.  PDMP reviewed. No red flags.

## 2019-10-08 NOTE — Assessment & Plan Note (Addendum)
138/82 in clinic today.  Obtaining BMP today

## 2019-10-08 NOTE — Assessment & Plan Note (Signed)
Checking UPC today.  Patient with hypertension but not currently on ACE or ARB with history of proteinuria.

## 2019-10-08 NOTE — Assessment & Plan Note (Signed)
Continuing her care at [facility].  Encourage patient to increase visits with her therapist to help her with her recent increased anxiety.

## 2019-10-08 NOTE — Assessment & Plan Note (Signed)
Patient is status post hysterectomy and follows with an OB/GYN for her care.

## 2019-10-10 ENCOUNTER — Telehealth: Payer: Self-pay

## 2019-10-10 MED ORDER — ATORVASTATIN CALCIUM 80 MG PO TABS: 80.0000 mg | ORAL_TABLET | Freq: Every day | ORAL | 1 refills | Status: DC

## 2019-10-10 NOTE — Telephone Encounter (Signed)
-----   Message from Wilber Oliphant, MD sent at 10/10/2019 11:30 AM EDT ----- Patient's lipid profile is elevated across the board.  She is currently taking atorvastatin 40 mg.  I would like to increase patient's atorvastatin to 80 mg and recheck in 6 months.  Please let patient know that this prescription will be sent to the pharmacy.  If she has remaining atorvastatin pills now, she can simply double the dose.  If she has any questions, I am happy to answer them.  It looks like she has a follow-up appointment with me on May 4.  Please also remind patient that I would like to get a urine sample from her at her next appointment to measure her kidney function.

## 2019-10-10 NOTE — Telephone Encounter (Signed)
LVM for a return call to our office. If pt calls, please have her speak with a nurse or CMA to discuss message from Dr. [name] below. Ottis Stain, CMA

## 2019-10-10 NOTE — Addendum Note (Signed)
Addended by: Zettie Cooley E on: 10/10/2019 11:30 AM   Modules accepted: Orders

## 2019-10-11 NOTE — Telephone Encounter (Signed)
Contacted pt to inform her of below and she said that she had already spoken to someone yesterday.[name], CMA

## 2019-10-22 ENCOUNTER — Other Ambulatory Visit: Payer: Self-pay

## 2019-10-22 ENCOUNTER — Ambulatory Visit (INDEPENDENT_AMBULATORY_CARE_PROVIDER_SITE_OTHER): Payer: Medicaid Other | Admitting: Family Medicine

## 2019-10-22 ENCOUNTER — Encounter: Payer: Self-pay | Admitting: Family Medicine

## 2019-10-22 VITALS — BP 130/90 | HR 103 | Ht 68.0 in | Wt 230.0 lb

## 2019-10-22 DIAGNOSIS — Z6834 Body mass index (BMI) 34.0-34.9, adult: Secondary | ICD-10-CM | POA: Diagnosis not present

## 2019-10-22 DIAGNOSIS — E782 Mixed hyperlipidemia: Secondary | ICD-10-CM | POA: Diagnosis not present

## 2019-10-22 DIAGNOSIS — R809 Proteinuria, unspecified: Secondary | ICD-10-CM

## 2019-10-22 DIAGNOSIS — F4310 Post-traumatic stress disorder, unspecified: Secondary | ICD-10-CM | POA: Diagnosis not present

## 2019-10-22 DIAGNOSIS — F419 Anxiety disorder, unspecified: Secondary | ICD-10-CM | POA: Diagnosis not present

## 2019-10-22 DIAGNOSIS — I10 Essential (primary) hypertension: Secondary | ICD-10-CM | POA: Diagnosis not present

## 2019-10-22 NOTE — Patient Instructions (Signed)
Dear [name],   It was good to see you! Thank you for taking your time to come in to be seen. Today, we discussed the following:   1. Will reachout to [facility] psychiatrist for you to be seen sooner  2. Will call you about your urine and whether or not we should add medication    For all labs obtained today, I will send results via MyChart.  If anything is abnormal, we will call you for details on further management.  Please bring all of your medications to your next appointment.    Be well,   Zettie Cooley, M.D   [facility] [phone]  *Sign up for MyChart for instant access to your health profile, labs, orders, upcoming appointments or to contact your provider with questions*   Psychiatry Resource List (Adults and Children) Most of these providers will take Medicaid. please consult your insurance for a complete and updated list of available providers. When calling to make an appointment have your insurance information available to confirm you are covered.  [place]:   [place]: [address]     [phone]   [place]na Hoff: [place],        [phone] [place]: [place],    [place]: [place],                   [phone] [place]: [place] [address]         [phone]  [place]  (Psychiatry & counseling ; adults & children ; will take Medicaid) [place], [place], [place]        [phone]   [place]  (Psychiatry only; Adults only, will take Medicaid)  [place], [place], [place] [address]       [phone]   [place] (Psychiatry & counseling ; adults & children ; will take Medicaid [address]  [place] [address]   Go on-line to complete referral (  https://[email or web address].org/en/make-[email or web address]-referral [phone]    (Spanish therapist)  [facility]  Psychiatry & counseling; Adults and children;  Call Registration prior to scheduling an appointment [phone] [place] [address]    [place], [place] [address]    [phone]  [facility] (Psychiatry & counseling; adults & children; Medicare no Medicaid)  [place],   [address]      [phone]    [facility] (up to age 72)  Psychiatry & counseling ,will take Medicaid, must do counseling to receive psychiatry services  [address] [place] [address]        ([place] (Psychiatry & counseling; adults & children; will take Medicaid) Will need a referral from provider [address],  [place], [place]  [phone]  [facility]---  Walk-in Mon-Fri, 8:30-5:00 (will take Medicaid)  [address] [place], [place]  [phone]  To schedule an appointment call [phone]  [facility] --- Walk-In Mon-Friday 8am-3pm ( will take Medicaid, Psychiatry, Adults & children,  [address], [place], [place]   [phone]   [facility] [place]--, Walk-in M-F 8am-12pm and 1pm -3pm   (Counseling, Psychiatry, will take Medicaid, adults & children)  [address] [place], [place]  [phone]     ------------------------------------------------------------------------------------------------------------------------------------------------------------------ Support in a Crisis What if I or someone I know is in crisis?  . If you are thinking about harming yourself or having thoughts of suicide, or if you know someone who is, seek help right away. . Call your doctor or mental health care provider. . Call 911 or go to a hospital emergency room to get immediate help, or ask a friend or family member to help you do these things. . Call the [place]  National Suicide Prevention Lifeline's toll-free, 24-hour hotline at 1-800-273-TALK 580-350-5008) or TTY: 1-800-799-4 TTY (930) 054-0614) to talk to a trained counselor. . If you are in crisis, make sure you are not left alone.  . If someone else is in crisis, make sure he or she is not left alone  24 Hour Availability [facility]  [address], [place], [place] [address]  [phone] or [phone]  [facility] [employer] (Domestic Violence, Rape & Victim Assistance [phone]  [facility]  [address], [place]  [address]               [phone] or [phone]  [place]    (ONLY from 8am-4pm)    [phone]  [facility] Mobile Crisis Unit (24/7)   [phone]  [place] National Suicide Hotline   [phone] Diamantina Mon[place])

## 2019-10-22 NOTE — Progress Notes (Signed)
    SUBJECTIVE:  CHIEF COMPLAINT / HPI:   Hyperlipidemia follow up  Patient's lipid profile elevated. Increased atorvastatin to 80 mg after last visit. Patient does not have any side effects at this time.   HTN follow up Wanted to obtain urine protein creatinine ratio at last visit, but patient unable to void. Will obtain today.  Currently taking amlodipine 5 mg.   Anxiety, hx of complex PTSD At patient's last visit, agreed on one time prescription for Klonopin for emergency use until she was able to reach out to [facility].  [facility] also prescribes the remainder of her medication.  Patient reports that the Klonopin has been helpful, however, 0.5 mg is too small of the dose that she doubles up.  Patient reports that she feels more productive and is able to get more done during the day when she takes the Klonopin.  She reports that she took 1 mg of Klonopin prior to coming in which was helpful for the anxiety that she was experiencing prior to the car drive over.  She reports that she recently had an appointment with her psychiatrist for or her therapist  (was not clear) over video chat.  She has scheduled her next video chat with her therapist in June.  She also notes that her psychiatrist increased her dose of Latuda to 60 mg, which has been updated in the chart. Patient denies any SI today.  She reports she would never be able to as she has 3 children, and her oldest is 41.   OBJECTIVE:  BP 130/90   Pulse (!) 103   Ht 5\' 8"  (1.727 m)   Wt 230 lb (104.3 kg)   LMP 01/08/2014   SpO2 97%   BMI 34.97 kg/m   General: Nontoxic appearing female.  Mild distress.  She bites her nails and shakes her legs throughout the visit.  She appears tired.   ASSESSMENT/PLAN:  PTSD (post-traumatic stress disorder) Patient coming in to ask for another prescription of Klonopin.  Reminded patient that I specifically said at last visit that I would not be refilling this medication for her and this was for acute  use only.  We spoke previously that using benzodiazepines in her case does not fix the problem but just put a Band-Aid over the problem temporarily.  She reports that she is okay with just a Band-Aid.  She becomes tearful during this conversation.  She does not understand why she cannot get her own prescription of benzodiazepine.  I suggested that patient go to [facility] and ask if they will prescribe her the benzodiazepine as her psychiatrist prescribes the rest of her mental health medications.  It may be a better idea to have 1 provider prescribing her psych medications, especially in the case of acute titrations for acute worsening of anxiety, depression, posttraumatic stress disorder.  I am happy to call over to the provider, Ms. Glory Buff [place], [place] to see if patient can get an appointment sooner than 3 months.  Called over to [place] and spoke with.  He reports that Glory Buff has sent over a prescription for Klonopin for the patient, and also apologized about the "oversight" on her part after seeing the patient the other day.  Will defer all mental health management to [facility].     Mixed hyperlipidemia Recheck lipid panel in 3-6 months.  If TG elevated, consider ezetemibe.    Wilber Oliphant, MD [place]

## 2019-10-23 ENCOUNTER — Ambulatory Visit: Payer: Medicaid Other

## 2019-10-23 ENCOUNTER — Encounter: Payer: Self-pay | Admitting: Family Medicine

## 2019-10-23 DIAGNOSIS — Z6834 Body mass index (BMI) 34.0-34.9, adult: Secondary | ICD-10-CM | POA: Insufficient documentation

## 2019-10-23 NOTE — Assessment & Plan Note (Signed)
Recheck lipid panel in 3-6 months.  If TG elevated, consider ezetemibe.

## 2019-10-23 NOTE — Assessment & Plan Note (Addendum)
Patient coming in to ask for another prescription of Klonopin.  Reminded patient that I specifically said at last visit that I would not be refilling this medication for her and this was for acute use only.  We spoke previously that using benzodiazepines in her case does not fix the problem but just put a Band-Aid over the problem temporarily.  She reports that she is okay with just a Band-Aid.  She becomes tearful during this conversation.  She does not understand why she cannot get her own prescription of benzodiazepine.  I suggested that patient go to [facility] and ask if they will prescribe her the benzodiazepine as her psychiatrist prescribes the rest of her mental health medications.  It may be a better idea to have 1 provider prescribing her psych medications, especially in the case of acute titrations for acute worsening of anxiety, depression, posttraumatic stress disorder.  I am happy to call over to the provider, Ms. Glory Buff [place], [place] to see if patient can get an appointment sooner than 3 months.  Called over to [place] and spoke with.  He reports that Glory Buff has sent over a prescription for Klonopin for the patient, and also apologized about the "oversight" on her part after seeing the patient the other day.  Will defer all mental health management to [facility].

## 2019-10-25 LAB — MICROALBUMIN / CREATININE URINE RATIO
Creatinine, Urine: 28.9 mg/dL
Microalb/Creat Ratio: 11 mg/g creat (ref 0–29)
Microalbumin, Urine: 3.1 ug/mL

## 2019-11-16 ENCOUNTER — Other Ambulatory Visit: Payer: Self-pay | Admitting: Family Medicine

## 2019-12-04 DIAGNOSIS — F411 Generalized anxiety disorder: Secondary | ICD-10-CM | POA: Diagnosis not present

## 2019-12-11 ENCOUNTER — Other Ambulatory Visit: Payer: Self-pay

## 2019-12-11 ENCOUNTER — Ambulatory Visit
Admission: RE | Admit: 2019-12-11 | Discharge: 2019-12-11 | Disposition: A | Discharge status: Home / Self Care | Payer: Medicaid Other | Source: Ambulatory Visit | Attending: Family Medicine | Admitting: Family Medicine

## 2019-12-11 DIAGNOSIS — Z1231 Encounter for screening mammogram for malignant neoplasm of breast: Secondary | ICD-10-CM

## 2019-12-18 ENCOUNTER — Other Ambulatory Visit: Payer: Self-pay | Admitting: Family Medicine

## 2019-12-18 DIAGNOSIS — R928 Other abnormal and inconclusive findings on diagnostic imaging of breast: Secondary | ICD-10-CM

## 2019-12-19 ENCOUNTER — Other Ambulatory Visit: Payer: Self-pay | Admitting: Family Medicine

## 2019-12-31 ENCOUNTER — Other Ambulatory Visit: Payer: Self-pay

## 2019-12-31 ENCOUNTER — Ambulatory Visit: Payer: Medicaid Other

## 2019-12-31 ENCOUNTER — Ambulatory Visit
Admission: RE | Admit: 2019-12-31 | Discharge: 2019-12-31 | Disposition: A | Discharge status: Home / Self Care | Payer: Medicaid Other | Source: Ambulatory Visit | Attending: Family Medicine | Admitting: Family Medicine

## 2019-12-31 DIAGNOSIS — R928 Other abnormal and inconclusive findings on diagnostic imaging of breast: Secondary | ICD-10-CM

## 2019-12-31 DIAGNOSIS — R922 Inconclusive mammogram: Secondary | ICD-10-CM | POA: Diagnosis not present

## 2020-02-07 ENCOUNTER — Encounter: Payer: Self-pay | Admitting: Family Medicine

## 2020-02-07 ENCOUNTER — Other Ambulatory Visit: Payer: Self-pay

## 2020-02-07 ENCOUNTER — Ambulatory Visit (INDEPENDENT_AMBULATORY_CARE_PROVIDER_SITE_OTHER): Payer: Medicaid Other | Admitting: Family Medicine

## 2020-02-07 VITALS — BP 130/94 | HR 89 | Ht 68.0 in | Wt 230.8 lb

## 2020-02-07 DIAGNOSIS — F419 Anxiety disorder, unspecified: Secondary | ICD-10-CM

## 2020-02-07 DIAGNOSIS — Z23 Encounter for immunization: Secondary | ICD-10-CM

## 2020-02-07 NOTE — Progress Notes (Signed)
    SUBJECTIVE:   CHIEF COMPLAINT / HPI:   Anxiety Patient reporting severe anxiety about Covid and about her children.  Patient reports that she has a son who is a Dietitian and who lives in the house.  Additionally, she has a younger son who will have to return to in person school as the district gave her no other options.  She reports that she has been homeschooling her son over the last year.  She is very distressed about sending him to school and him getting Covid.  Patient continues to follow over at [facility], however, her recent psychiatric APP has left [facility].  She is frustrated that every time she gets comfortable with a provider, they seem to leave.  She still following with her therapist.  Patient is requesting benzodiazepines today.  Covid vaccination Patient is contemplating Covid vaccination.  She would like to discuss more today.  She is afraid of the vaccination but reports that she is more afraid of getting Covid to her kids or contracting herself.   PERTINENT  PMH / PSH: Severe anxiety   OBJECTIVE:   BP (!) 130/94   Pulse 89   Ht 5\' 8"  (1.727 m)   Wt 230 lb 12.8 oz (104.7 kg)   LMP 01/08/2014   SpO2 94%   BMI 35.09 kg/m   General: tearful, appears tired  Psych: Patient is with good hygiene, hair dishelved.  Speech is normal with normal rate, latency, volume and intonation.  Behavior is normal with normal eye contact.  Patient is friendly, cooperative.  Tight and logical thought process is.  Patient ruminates on stressors and her need for benzodiazepines.  Mood is good with appropriate affect and range of emotion.   ASSESSMENT/PLAN:   Anxiety Have discussed anxiety with patient previously and have deferred all medication for [facility]. Per PDMP, her provider had sent 3 rx for klonopin. Since her provider has left, she has not had any more. She does not know if she is getting another provider and they have no contacted her about it. I have encouraged patient to  call [facility] and ask for new provider for medication needs. I will not be filling chronic benzodiazepines for this patient given her complicated anxiety and behavioral health history, I am not comfortable prescribing any additional prescription medications for this patient while she is under the care of a psychiatrist.  Patient denies SI, HI today. GAD 7 is 21 and PHQ is 19 (last question 0).        Wilber Oliphant, MD [place]

## 2020-02-11 ENCOUNTER — Encounter: Payer: Self-pay | Admitting: Family Medicine

## 2020-02-11 NOTE — Assessment & Plan Note (Addendum)
Have discussed anxiety with patient previously and have deferred all medication for [facility]. Per PDMP, her provider had sent 3 rx for klonopin. Since her provider has left, she has not had any more. She does not know if she is getting another provider and they have no contacted her about it. I have encouraged patient to call [facility] and ask for new provider for medication needs. I will not be filling chronic benzodiazepines for this patient given her complicated anxiety and behavioral health history, I am not comfortable prescribing any additional prescription medications for this patient while she is under the care of a psychiatrist.  Patient denies SI, HI today. GAD 7 is 21 and PHQ is 19 (last question 0).

## 2020-02-19 DIAGNOSIS — F411 Generalized anxiety disorder: Secondary | ICD-10-CM | POA: Diagnosis not present

## 2020-02-19 DIAGNOSIS — F4310 Post-traumatic stress disorder, unspecified: Secondary | ICD-10-CM | POA: Diagnosis not present

## 2020-02-19 DIAGNOSIS — F319 Bipolar disorder, unspecified: Secondary | ICD-10-CM | POA: Diagnosis not present

## 2020-02-28 ENCOUNTER — Ambulatory Visit (INDEPENDENT_AMBULATORY_CARE_PROVIDER_SITE_OTHER): Payer: Medicaid Other

## 2020-02-28 ENCOUNTER — Other Ambulatory Visit: Payer: Self-pay

## 2020-02-28 DIAGNOSIS — Z23 Encounter for immunization: Secondary | ICD-10-CM | POA: Diagnosis present

## 2020-02-28 NOTE — Progress Notes (Signed)
   Covid-19 Vaccination Clinic  Name:  [name]    MRN: [id number] DOB: 02/16/1976  02/28/2020  Patient presents to nurse clinic for second [place] immunization. Patient denies previous allergic reaction and answers no to all screening questions. Administered in RD, site unremarkable, tolerated injection well.   [name] was observed post Covid-19 immunization for 15 minutes without incident. She was provided with Vaccine Information Sheet and instruction to access the V-Safe system.   [name] was instructed to call 911 with any severe reactions post vaccine: Marland Kitchen Difficulty breathing  . Swelling of face and throat  . A fast heartbeat  . A bad rash all over body  . Dizziness and weakness  Provided patient with updated immunization card and record.   Talbot Grumbling, RN

## 2020-03-09 DIAGNOSIS — F319 Bipolar disorder, unspecified: Secondary | ICD-10-CM | POA: Diagnosis not present

## 2020-03-09 DIAGNOSIS — F411 Generalized anxiety disorder: Secondary | ICD-10-CM | POA: Diagnosis not present

## 2020-03-09 DIAGNOSIS — F4310 Post-traumatic stress disorder, unspecified: Secondary | ICD-10-CM | POA: Diagnosis not present

## 2020-05-25 DIAGNOSIS — F411 Generalized anxiety disorder: Secondary | ICD-10-CM | POA: Diagnosis not present

## 2020-05-25 DIAGNOSIS — F4310 Post-traumatic stress disorder, unspecified: Secondary | ICD-10-CM | POA: Diagnosis not present

## 2020-05-25 DIAGNOSIS — F319 Bipolar disorder, unspecified: Secondary | ICD-10-CM | POA: Diagnosis not present

## 2020-06-11 ENCOUNTER — Other Ambulatory Visit: Payer: Self-pay | Admitting: Family Medicine

## 2020-07-21 DIAGNOSIS — F411 Generalized anxiety disorder: Secondary | ICD-10-CM | POA: Diagnosis not present

## 2020-07-21 DIAGNOSIS — F319 Bipolar disorder, unspecified: Secondary | ICD-10-CM | POA: Diagnosis not present

## 2020-07-21 DIAGNOSIS — F4310 Post-traumatic stress disorder, unspecified: Secondary | ICD-10-CM | POA: Diagnosis not present

## 2020-08-07 ENCOUNTER — Ambulatory Visit (INDEPENDENT_AMBULATORY_CARE_PROVIDER_SITE_OTHER): Payer: Medicaid Other | Admitting: Family Medicine

## 2020-08-07 ENCOUNTER — Other Ambulatory Visit: Payer: Self-pay

## 2020-08-07 VITALS — BP 140/80 | HR 99 | Ht 68.0 in | Wt 225.8 lb

## 2020-08-07 DIAGNOSIS — M62838 Other muscle spasm: Secondary | ICD-10-CM

## 2020-08-07 MED ORDER — DICLOFENAC SODIUM 1 % EX GEL: 2.0000 g | Freq: Four times a day (QID) | CUTANEOUS | 1 refills | Status: AC

## 2020-08-07 MED ORDER — TIZANIDINE HCL 2 MG PO TABS: 2.0000 mg | ORAL_TABLET | Freq: Four times a day (QID) | ORAL | 0 refills | Status: DC | PRN

## 2020-08-07 NOTE — Patient Instructions (Addendum)
Thank you for coming in to see [name] today! Please see below to review our plan for today's visit:  1. Tylenol 1000mg  three times daily with Ibuprofen 400mg  with the tylenol 3 times daily for pain.  2. Apply Diclofenac/Voltaren gel to your shoulder up to 4 times daily. Check the over the counter price.  3. Take Tizanidine 2mg  up to 4 times daily.  4. I have referred you to Sports medicine.   Please call the clinic at [phone] if your symptoms worsen or you have any concerns. It was our pleasure to serve you!   Dr. Milus Banister [facility]

## 2020-08-11 DIAGNOSIS — M62838 Other muscle spasm: Secondary | ICD-10-CM | POA: Insufficient documentation
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
## 2020-08-11 NOTE — Assessment & Plan Note (Signed)
Patient with left shoulder pain muscle spasms appreciated to her rhomboids, trapezius, and left-sided paravertebral spinal musculature of the thoracic region.  No gross deformity or functional deficit appreciated on physical exam.  Good range of motion however is very painful, could consider frozen shoulder/adhesive capsulitis considering her history of left shoulder injuries.  No obvious deficit on shoulder exam, and no recent inciting incident, to suggest rotator cuff etiology.  No gross deformity/swelling/bruising to suggest acute fracture or AC joint injury.  Due to severe nature of pain believe patient warrants further investigation with imaging to assess the cause of her pain. -Tylenol 1000mg  three times daily with Ibuprofen 400mg  with the tylenol 3 times daily for pain.  -Apply Diclofenac/Voltaren gel to shoulder up to 4 times daily.  -Take Tizanidine 2mg  up to 4 times daily.  -Referral made to Sports medicine.

## 2020-08-12 ENCOUNTER — Other Ambulatory Visit: Payer: Self-pay

## 2020-08-12 ENCOUNTER — Ambulatory Visit (INDEPENDENT_AMBULATORY_CARE_PROVIDER_SITE_OTHER): Payer: Medicaid Other | Admitting: Family Medicine

## 2020-08-12 ENCOUNTER — Ambulatory Visit: Payer: Self-pay

## 2020-08-12 VITALS — BP 120/84 | Ht 68.0 in | Wt 225.0 lb

## 2020-08-12 DIAGNOSIS — G8929 Other chronic pain: Secondary | ICD-10-CM

## 2020-08-12 DIAGNOSIS — M25512 Pain in left shoulder: Secondary | ICD-10-CM | POA: Diagnosis present

## 2020-08-12 MED ORDER — METHOCARBAMOL 500 MG PO TABS: 500.0000 mg | ORAL_TABLET | Freq: Four times a day (QID) | ORAL | 0 refills | Status: DC | PRN

## 2020-08-12 MED ORDER — PREDNISONE 10 MG PO TABS: ORAL_TABLET | ORAL | 0 refills | Status: DC

## 2020-08-12 NOTE — Patient Instructions (Signed)
It was great to meet you today! Thank you for letting me participate in your care!  Today, we discussed your right shoulder and right arm pain. Your exam and history suggest you may have damaged your shoulder from previous dislocations years ago. Thankfully, your ultrasound did not show any tendon tears or disruption. I have ordered x-rays and will follow up on those and call you once I have results.   In the meantime, I do want to start you on a prednisone dose pack that may help if your pain is coming from nerve impingement in your neck. Keep using the voltaren gel and I have prescribed you a new muscle relaxer.  I will plan on seeing you back in 3 weeks but will talk to you sooner to discuss next steps after I have x-ray results.  Be well, Harolyn Rutherford, DO PGY-4, Sports Medicine Fellow [place]

## 2020-08-12 NOTE — Progress Notes (Addendum)
SUBJECTIVE:   CHIEF COMPLAINT / HPI:   Left shoulder/Neck Pain [name] is a pleasant 45 year old female who is referred to me by Dr. Ouida Sills at [facility] family practice for left shoulder pain.  This is been ongoing for multiple years but in the past few months it is been increasing in both quality and becoming more constant.  She states the pain is mainly located over the left shoulder but sometimes radiates up into the neck and can radiate all the way down to her arm into her hand.  She does get hand numbness and tingling.  Some of the pain when it radiates down her arm is described as burning.  Overall she has at this point constant pain in her left shoulder makes it difficult for her to sleep she cannot lay on that side.  She has tried ibuprofen 600 mg 3 times per day as well as Voltaren gel as well as a muscle relaxer with very little relief.  She has a pertinent past medical history of shoulder dislocation x2.  She is also taking gabapentin for mild fibromyalgia.  PERTINENT  PMH / PSH: Previous history of left shoulder dislocation over several years ago, fibromyalgia, anxiety, history of major depression, history of PTSD  OBJECTIVE:   BP 120/84   Ht 5\' 8"  (1.727 m)   Wt 225 lb (102.1 kg)   LMP 01/08/2014   BMI 34.21 kg/m   No flowsheet data found.  Shoulder, left: No evidence of bony deformity, asymmetry, or muscle atrophy; Mild tenderness over long head of biceps (bicipital groove). No TTP at [facility] joint. Full active and passive range of motion but painful, Thumb unable to get to T12 without significant tenderness. Strength 5/5 throughout. Sensation intact. Peripheral pulses intact. Special Tests:   - Spurling's test: Positive   - Crossarm test: NEG - Jobe test: Equivical   - Hawkins: NEG   - Neer test: NEG   - Belly press test: NEG   - Drop arm test: NEG - Apprehension test: Equivical   - Obrien's test: Equivical   - Speeds test: NEG  [place] shoulder: -Biceps tendon: Well  visualized within the bicipital groove.  There is no calcification in the proximal biceps. -Pectoralis: Insertion visualized and without abnormalities. -Subscapularis: Well visualized to insertion point on humerus.  No abnormalities.  Dynamic testing over the coracoid did not show signs of impingement. -AC joint: No osteophytes and no fluid collection, no significant separation -Supraspinatus: Small area of calcification in the anterior fibers distal insertion of the supraspinatus.  Dynamic testing did not reveal signs of impingement. -Subacromial bursa: No bursal swelling -Infraspinatus/teres minor: Insertion point on posterior humerus visualized and without abnormalities. Conclusion: Normal US of the left shoulder   ASSESSMENT/PLAN:   Left shoulder pain Patient history, presentation, exam, and ultrasound findings unclear today.  I am ordering cervical spine x-rays as well as left shoulder x-rays for help with further evaluation.  Her pain pattern is very unclear but given her history of dislocation that was never fully evaluated in the past she could have some damage to the capsule and labrum of the shoulder as well as some early onset degenerative disease in the glenohumeral joint due to this.  Hopefully the x-rays of the shoulder will give [place] a better idea if this is the cause of her pain.  Ultrasound showed no tearing of the rotator cuff tendons and no pathology of the [facility] joint or biceps tendon.  Also symptoms did not suggest impingement.  Some of her symptoms were concerning for nerve impingement coming from the cervical spine so I did order x-rays for further evaluation of this. -Can continue with ibuprofen 800 3 times daily -Starting prednisone Dosepak, I am hopeful that this can be possibly therapeutic and diagnostic.  If this does help it does suggest more of a neurological impingement causing her pain -Switch to muscle relaxer to Robaxin to see if this will give her any better  relief -Once x-rays are back I will call her to discuss results and next steps      Nuala Alpha, DO PGY-4, Sports Medicine Fellow [place]  Addendum:  I was the preceptor for this visit and available for immediate consultation.  Karlton Lemon MD [place]

## 2020-08-13 DIAGNOSIS — M25512 Pain in left shoulder: Secondary | ICD-10-CM | POA: Insufficient documentation

## 2020-08-13 NOTE — Assessment & Plan Note (Signed)
Patient history, presentation, exam, and ultrasound findings unclear today.  I am ordering cervical spine x-rays as well as left shoulder x-rays for help with further evaluation.  Her pain pattern is very unclear but given her history of dislocation that was never fully evaluated in the past she could have some damage to the capsule and labrum of the shoulder as well as some early onset degenerative disease in the glenohumeral joint due to this.  Hopefully the x-rays of the shoulder will give [place] a better idea if this is the cause of her pain.  Ultrasound showed no tearing of the rotator cuff tendons and no pathology of the [facility] joint or biceps tendon.  Also symptoms did not suggest impingement.  Some of her symptoms were concerning for nerve impingement coming from the cervical spine so I did order x-rays for further evaluation of this. -Can continue with ibuprofen 800 3 times daily -Starting prednisone Dosepak, I am hopeful that this can be possibly therapeutic and diagnostic.  If this does help it does suggest more of a neurological impingement causing her pain -Switch to muscle relaxer to Robaxin to see if this will give her any better relief -Once x-rays are back I will call her to discuss results and next steps

## 2020-08-20 ENCOUNTER — Ambulatory Visit
Admission: RE | Admit: 2020-08-20 | Discharge: 2020-08-20 | Disposition: A | Discharge status: Home / Self Care | Payer: Medicaid Other | Source: Ambulatory Visit | Attending: Family Medicine | Admitting: Family Medicine

## 2020-08-20 DIAGNOSIS — G8929 Other chronic pain: Secondary | ICD-10-CM

## 2020-08-20 DIAGNOSIS — M25512 Pain in left shoulder: Secondary | ICD-10-CM

## 2020-09-02 ENCOUNTER — Other Ambulatory Visit: Payer: Self-pay

## 2020-09-02 ENCOUNTER — Ambulatory Visit: Payer: Medicaid Other | Admitting: Family Medicine

## 2020-09-02 VITALS — BP 128/92 | Ht 68.0 in | Wt 225.0 lb

## 2020-09-02 DIAGNOSIS — M25512 Pain in left shoulder: Secondary | ICD-10-CM

## 2020-09-02 DIAGNOSIS — G8929 Other chronic pain: Secondary | ICD-10-CM | POA: Diagnosis not present

## 2020-09-02 MED ORDER — TIZANIDINE HCL 4 MG PO TABS: 4.0000 mg | ORAL_TABLET | Freq: Four times a day (QID) | ORAL | 0 refills | Status: DC | PRN

## 2020-09-02 NOTE — Patient Instructions (Signed)
It was great to see you today! Thank you for letting me participate in your care!  Today, we discussed your continued left sided neck and shoulder pain. I am ordering am MRI of your spine. I will follow up with you after it is complete and we will discuss results.  In the meantime you can take Tizanidine for pain.  For your hip pain I am giving you some home exercises which I think will help.  Be well, Harolyn Rutherford, DO PGY-4, Sports Medicine Fellow [place]

## 2020-09-02 NOTE — Progress Notes (Addendum)
    SUBJECTIVE:   CHIEF COMPLAINT / HPI:   Left Sided Neck and Shoulder Pain [name] is unfortunately still having left sided neck pain worse in certain positions that radiates to her left shoulder and sometimes is associated with a "electric shock" feeling down her left arm. She states she has had minimal change in her symptoms but the prednisone did provide temporary relief. She did home exercises for the past 6 weeks with no benefit. We did discuss the results of her x-rays which did show some degenerative disc disease at the levels of C5-C7 with facet hypertrophy at the same levels with loss of normal cervical lordosis.  She also states she is having some lateral hip pain which is new.  PERTINENT  PMH / PSH:   OBJECTIVE:   BP (!) 128/92   Ht 5\' 8"  (1.727 m)   Wt 225 lb (102.1 kg)   LMP 01/08/2014   BMI 34.21 kg/m   No flowsheet data found.  MSK: TTP at the left lower cervical spine at the lateral processes. Decreased ROM of cervical spine in flexion and extension due to pain. Shoulder ROM normal in all planes. Left UE strength and sensation intact.  ASSESSMENT/PLAN:   Left shoulder pain Discussion with patient today that I believe her shoulder pain is originating from her neck. Given her history, exam, and x-ray findings and given that she is having severe symptoms not responding to conservative treatment I will order an MRI of the cervical spine. Unclear if nerve impingement or degenerative arthritic changes are the cause but could be combination. - F/u after MRI to discuss next steps.     Nuala Alpha, DO PGY-4, Sports Medicine Fellow [place]  Addendum:  I was the preceptor for this visit and available for immediate consultation.  Karlton Lemon MD [name]

## 2020-09-02 NOTE — Assessment & Plan Note (Addendum)
Discussion with patient today that I believe her shoulder pain is originating from her neck. Given her history, exam, and x-ray findings and given that she is having severe symptoms not responding to conservative treatment I will order an MRI of the cervical spine. Unclear if nerve impingement or degenerative arthritic changes are the cause but could be combination. - F/u after MRI to discuss next steps.

## 2020-09-24 ENCOUNTER — Other Ambulatory Visit: Payer: Self-pay

## 2020-09-24 ENCOUNTER — Ambulatory Visit
Admission: RE | Admit: 2020-09-24 | Discharge: 2020-09-24 | Disposition: A | Discharge status: Home / Self Care | Payer: Medicaid Other | Source: Ambulatory Visit | Attending: Sports Medicine | Admitting: Sports Medicine

## 2020-09-24 DIAGNOSIS — G8929 Other chronic pain: Secondary | ICD-10-CM

## 2020-09-24 DIAGNOSIS — M4802 Spinal stenosis, cervical region: Secondary | ICD-10-CM | POA: Diagnosis not present

## 2020-09-24 DIAGNOSIS — M25512 Pain in left shoulder: Secondary | ICD-10-CM

## 2020-10-06 ENCOUNTER — Ambulatory Visit: Payer: Medicaid Other | Admitting: Sports Medicine

## 2020-10-07 ENCOUNTER — Other Ambulatory Visit: Payer: Self-pay

## 2020-10-07 ENCOUNTER — Ambulatory Visit (INDEPENDENT_AMBULATORY_CARE_PROVIDER_SITE_OTHER): Payer: Medicaid Other | Admitting: Family Medicine

## 2020-10-07 VITALS — BP 124/86 | Ht 68.0 in | Wt 220.0 lb

## 2020-10-07 DIAGNOSIS — M25512 Pain in left shoulder: Secondary | ICD-10-CM

## 2020-10-07 NOTE — Patient Instructions (Addendum)
It was great to see you today! Today, we discussed your abnormal cervical MRI and I am referring you to a Neurosurgeon for discussion on what options to take to best address your pain and symptoms. Please be sure to get seen by them within the next few weeks.  Be well, Harolyn Rutherford, DO PGY-4, Sports Medicine Fellow [facility] [address] [place], [place] [address] [phone]  They will call you to schedule an appt.

## 2020-10-07 NOTE — Assessment & Plan Note (Signed)
Continues to have symptoms which are largely unchanged with a abnormal cervical MRI.  Cervical MRI was significant for severe left-sided foraminal impingement at the C5-C6 and C6-C7 levels.  Spinal cord flattening seen at C5-C6 level.  She also has multiple level degenerative disc disease with facet arthropathy. -Given abnormal MRI with severe left-sided foraminal impingement and some spinal cord flattening will refer to neurosurgery to discuss options.  Encouraging that she had no myelopathy symptoms or no myelopathy findings on exam today.

## 2020-10-07 NOTE — Progress Notes (Signed)
    SUBJECTIVE:   CHIEF COMPLAINT / HPI:   Left sided radiculopathy [name] presents today to discuss the results of her cervical MRI.  She continues to have pain and numbness and tingling radiating from the neck to the left shoulder intermittently down the left arm.  Distribution is in the C6-C7 nerve roots.  Endorses no weakness and says she has had no significant change in her symptoms since she was last seen in our office.  PERTINENT  PMH / PSH: HTN, Anxiety, HLD, Hx of PTSD, Hx of Depression  OBJECTIVE:   BP 124/86   Ht 5\' 8"  (1.727 m)   Wt 220 lb (99.8 kg)   LMP 01/08/2014   BMI 33.45 kg/m   No flowsheet data found.  MSK: Examination today of the upper extremities significant for equal symmetric grip strength, normal pincer strength, normal finger abduction strength.  Strength of wrist flexion and extension against resistance normal.  ASSESSMENT/PLAN:   Left shoulder pain Continues to have symptoms which are largely unchanged with a abnormal cervical MRI.  Cervical MRI was significant for severe left-sided foraminal impingement at the C5-C6 and C6-C7 levels.  Spinal cord flattening seen at C5-C6 level.  She also has multiple level degenerative disc disease with facet arthropathy. -Given abnormal MRI with severe left-sided foraminal impingement and some spinal cord flattening will refer to neurosurgery to discuss options.  Encouraging that she had no myelopathy symptoms or no myelopathy findings on exam today.     Nuala Alpha, DO PGY-4, Sports Medicine Fellow [place]  I was the preceptor for this visit and available for immediate consultation Shellia Cleverly, DO

## 2020-10-08 ENCOUNTER — Other Ambulatory Visit: Payer: Self-pay | Admitting: Family Medicine

## 2020-10-09 NOTE — Telephone Encounter (Signed)
Patient needs appt for future refills

## 2020-10-12 ENCOUNTER — Other Ambulatory Visit: Payer: Self-pay

## 2020-10-12 MED ORDER — GABAPENTIN 300 MG PO CAPS: ORAL_CAPSULE | ORAL | 2 refills | Status: DC

## 2020-10-12 MED ORDER — TIZANIDINE HCL 4 MG PO TABS: 4.0000 mg | ORAL_TABLET | Freq: Three times a day (TID) | ORAL | 0 refills | Status: DC | PRN

## 2020-10-12 NOTE — Progress Notes (Signed)
Pt will start taking gabapentin 300 mg in addition to her 600 mg gabapentin at nighttime for a total of 900 mg. If she needs to increase her dose she can for a total of 900 mg three times daily.

## 2020-10-12 NOTE — Telephone Encounter (Signed)
Called patient and scheduled appointment for medication refills.

## 2020-10-15 ENCOUNTER — Other Ambulatory Visit: Payer: Self-pay | Admitting: Neurosurgery

## 2020-10-15 DIAGNOSIS — M4802 Spinal stenosis, cervical region: Secondary | ICD-10-CM | POA: Diagnosis not present

## 2020-10-20 NOTE — Pre-Procedure Instructions (Signed)
Surgical Instructions    Your procedure is scheduled on Friday, May 6th.  Report to [facility] Main Entrance "A" at 7:45 A.M., then check in with the Admitting office.  Call this number if you have problems the morning of surgery:  [phone]   If you have any questions prior to your surgery date call [phone]: Open Monday-Friday 8am-4pm    Remember:  Do not eat or drink after midnight the night before your surgery    Take these medicines the morning of surgery with A SIP OF WATER  amLODipine (NORVASC)  gabapentin (NEURONTIN)  topiramate (TOPAMAX)   Take these AS NEEDED acetaminophen (TYLENOL)  hydrOXYzine (ATARAX/VISTARIL) tiZANidine (ZANAFLEX)  As of today, STOP taking any Aspirin (unless otherwise instructed by your surgeon) Aleve, Naproxen, Ibuprofen, Motrin, Advil, Goody's, BC's, all herbal medications, fish oil, and all vitamins. This includes: diclofenac Sodium (VOLTAREN) gel.                     Do NOT Smoke (Tobacco/Vaping) or drink Alcohol 24 hours prior to your procedure.  If you use a CPAP at night, you may bring all equipment for your overnight stay.   Contacts, glasses, piercing's, hearing aid's, dentures or partials may not be worn into surgery, please bring cases for these belongings.    For patients admitted to the hospital, discharge time will be determined by your treatment team.   Patients discharged the day of surgery will not be allowed to drive home, and someone needs to stay with them for 24 hours.    Special instructions:   [place]- Preparing For Surgery  Before surgery, you can play an important role. Because skin is not sterile, your skin needs to be as free of germs as possible. You can reduce the number of germs on your skin by washing with CHG (chlorahexidine gluconate) Soap before surgery.  CHG is an antiseptic cleaner which kills germs and bonds with the skin to continue killing germs even after washing.    Oral Hygiene is also  important to reduce your risk of infection.  Remember - BRUSH YOUR TEETH THE MORNING OF SURGERY WITH YOUR REGULAR TOOTHPASTE  Please do not use if you have an allergy to CHG or antibacterial soaps. If your skin becomes reddened/irritated stop using the CHG.  Do not shave (including legs and underarms) for at least 48 hours prior to first CHG shower. It is OK to shave your face.  Please follow these instructions carefully.   1. Shower the NIGHT BEFORE SURGERY and the MORNING OF SURGERY  2. If you chose to wash your hair, wash your hair first as usual with your normal shampoo.  3. After you shampoo, rinse your hair and body thoroughly to remove the shampoo.  4. Use CHG Soap as you would any other liquid soap. You can apply CHG directly to the skin and wash gently with a scrungie or a clean washcloth.   5. Apply the CHG Soap to your body ONLY FROM THE NECK DOWN.  Do not use on open wounds or open sores. Avoid contact with your eyes, ears, mouth and genitals (private parts). Wash Face and genitals (private parts)  with your normal soap.   6. Wash thoroughly, paying special attention to the area where your surgery will be performed.  7. Thoroughly rinse your body with warm water from the neck down.  8. DO NOT shower/wash with your normal soap after using and rinsing off the CHG Soap.  9.  Pat yourself dry with a CLEAN TOWEL.  10. Wear CLEAN PAJAMAS to bed the night before surgery  11. Place CLEAN SHEETS on your bed the night before your surgery  12. DO NOT SLEEP WITH PETS.   Day of Surgery: Shower with CHG soap. Do not wear jewelry, make up, or nail polish Do not wear lotions, powders, perfumes, or deodorant. Do not shave 48 hours prior to surgery.   Do not bring valuables to the hospital. [facility] is not responsible for any belongings or valuables. Wear Clean/Comfortable clothing the morning of surgery Remember to brush your teeth WITH YOUR REGULAR TOOTHPASTE.   Please read  over the following fact sheets that you were given.

## 2020-10-21 ENCOUNTER — Other Ambulatory Visit: Payer: Self-pay

## 2020-10-21 ENCOUNTER — Encounter (HOSPITAL_COMMUNITY): Payer: Self-pay

## 2020-10-21 ENCOUNTER — Encounter (HOSPITAL_COMMUNITY)
Admission: RE | Admit: 2020-10-21 | Discharge: 2020-10-21 | Disposition: A | Discharge status: Home / Self Care | Payer: Medicaid Other | Source: Ambulatory Visit | Attending: Neurosurgery | Admitting: Neurosurgery

## 2020-10-21 DIAGNOSIS — Z01812 Encounter for preprocedural laboratory examination: Secondary | ICD-10-CM | POA: Insufficient documentation

## 2020-10-21 DIAGNOSIS — Z20822 Contact with and (suspected) exposure to covid-19: Secondary | ICD-10-CM | POA: Insufficient documentation

## 2020-10-21 HISTORY — DX: Anemia, unspecified: D64.9

## 2020-10-21 LAB — CBC WITH DIFFERENTIAL/PLATELET
Abs Immature Granulocytes: 0.12 10*3/uL — ABNORMAL HIGH (ref 0.00–0.07)
Basophils Absolute: 0.1 10*3/uL (ref 0.0–0.1)
Basophils Relative: 1 %
Eosinophils Absolute: 0.3 10*3/uL (ref 0.0–0.5)
Eosinophils Relative: 2 %
HCT: 43.6 % (ref 36.0–46.0)
Hemoglobin: 14.6 g/dL (ref 12.0–15.0)
Immature Granulocytes: 1 %
Lymphocytes Relative: 26 %
Lymphs Abs: 4 10*3/uL (ref 0.7–4.0)
MCH: 30.7 pg (ref 26.0–34.0)
MCHC: 33.5 g/dL (ref 30.0–36.0)
MCV: 91.8 fL (ref 80.0–100.0)
Monocytes Absolute: 0.8 10*3/uL (ref 0.1–1.0)
Monocytes Relative: 5 %
Neutro Abs: 10.2 10*3/uL — ABNORMAL HIGH (ref 1.7–7.7)
Neutrophils Relative %: 65 %
Platelets: 284 10*3/uL (ref 150–400)
RBC: 4.75 MIL/uL (ref 3.87–5.11)
RDW: 12.8 % (ref 11.5–15.5)
WBC: 15.5 10*3/uL — ABNORMAL HIGH (ref 4.0–10.5)
nRBC: 0 % (ref 0.0–0.2)

## 2020-10-21 LAB — BASIC METABOLIC PANEL WITH GFR
Anion gap: 7 (ref 5–15)
BUN: 8 mg/dL (ref 6–20)
CO2: 23 mmol/L (ref 22–32)
Calcium: 9.2 mg/dL (ref 8.9–10.3)
Chloride: 107 mmol/L (ref 98–111)
Creatinine, Ser: 0.63 mg/dL (ref 0.44–1.00)
GFR, Estimated: >60 mL/min
Glucose, Bld: 98 mg/dL (ref 70–99)
Potassium: 3.6 mmol/L (ref 3.5–5.1)
Sodium: 137 mmol/L (ref 135–145)

## 2020-10-21 LAB — SARS CORONAVIRUS 2 (TAT 6-24 HRS): SARS Coronavirus 2: NEGATIVE

## 2020-10-21 LAB — SURGICAL PCR SCREEN
MRSA, PCR: NEGATIVE
Staphylococcus aureus: POSITIVE — AB

## 2020-10-21 NOTE — Progress Notes (Addendum)
PCP: Zettie Cooley, MD Cardiologist: denies  EKG: 10/21/20 CXR: na ECHO: denies Stress Test: denies Cardiac Cath: denies  Fasting Blood Sugar- na Checks Blood Sugar_na__ times a day  OSA/CPAP: No  ASA/Blood Thinners: No  Covid test 10/21/20 at PAT  Anesthesia Review: Yes, elevated WBC  Patient denies shortness of breath, fever, cough, and chest pain at PAT appointment.  Patient verbalized understanding of instructions provided today at the PAT appointment.  Patient asked to review instructions at home and day of surgery.

## 2020-10-22 ENCOUNTER — Telehealth: Payer: Self-pay | Admitting: Family Medicine

## 2020-10-22 NOTE — Anesthesia Preprocedure Evaluation (Addendum)
Anesthesia Evaluation  Patient identified by MRN, date of birth, ID band Patient awake    Reviewed: Allergy & Precautions, NPO status , Patient's Chart, lab work & pertinent test results  Airway Mallampati: II  TM Distance: >3 FB     Dental   Pulmonary Current Smoker and Patient abstained from smoking.,    breath sounds clear to auscultation       Cardiovascular hypertension,  Rhythm:Regular Rate:Normal     Neuro/Psych Anxiety Depression  Neuromuscular disease    GI/Hepatic negative GI ROS, Neg liver ROS,   Endo/Other    Renal/GU negative Renal ROS     Musculoskeletal  (+) Fibromyalgia -  Abdominal   Peds  Hematology  (+) anemia ,   Anesthesia Other Findings   Reproductive/Obstetrics                            Anesthesia Physical Anesthesia Plan  ASA: III  Anesthesia Plan: General   Post-op Pain Management:    Induction: Intravenous  PONV Risk Score and Plan: Ondansetron, Dexamethasone and Midazolam  Airway Management Planned: Oral ETT  Additional Equipment:   Intra-op Plan:   Post-operative Plan:   Informed Consent: I have reviewed the patients History and Physical, chart, labs and discussed the procedure including the risks, benefits and alternatives for the proposed anesthesia with the patient or authorized representative who has indicated his/her understanding and acceptance.     Dental advisory given  Plan Discussed with: Anesthesiologist, CRNA and Surgeon  Anesthesia Plan Comments:        Anesthesia Quick Evaluation

## 2020-10-22 NOTE — Telephone Encounter (Signed)
It looks like Dr. Garlan Fillers sent in this medication (at its increased dose) on 10/12/20 with refills.

## 2020-10-22 NOTE — Telephone Encounter (Signed)
Patient called to cancel appointment due to having spinal surgery tomorrow 10/23/2020. Patient is going to call back to reschedule when she is able to. Patient stated she is going to need her refill on Gabapentin 600mg . Please advise. Thanks!

## 2020-10-23 ENCOUNTER — Encounter (HOSPITAL_COMMUNITY)
Admission: RE | Disposition: A | Discharge status: Home / Self Care | Payer: Self-pay | Source: Ambulatory Visit | Attending: Neurosurgery

## 2020-10-23 ENCOUNTER — Other Ambulatory Visit: Payer: Self-pay

## 2020-10-23 ENCOUNTER — Ambulatory Visit (HOSPITAL_COMMUNITY): Payer: Medicaid Other | Admitting: Certified Registered"

## 2020-10-23 ENCOUNTER — Ambulatory Visit (HOSPITAL_COMMUNITY): Payer: Medicaid Other | Admitting: Physician Assistant

## 2020-10-23 ENCOUNTER — Encounter (HOSPITAL_COMMUNITY): Payer: Self-pay | Admitting: Neurosurgery

## 2020-10-23 ENCOUNTER — Observation Stay (HOSPITAL_COMMUNITY)
Admission: RE | Admit: 2020-10-23 | Discharge: 2020-10-24 | Disposition: A | Discharge status: Home / Self Care | Payer: Medicaid Other | Source: Ambulatory Visit | Attending: Neurosurgery | Admitting: Neurosurgery

## 2020-10-23 ENCOUNTER — Ambulatory Visit (HOSPITAL_COMMUNITY): Payer: Medicaid Other

## 2020-10-23 DIAGNOSIS — Z91040 Latex allergy status: Secondary | ICD-10-CM | POA: Diagnosis not present

## 2020-10-23 DIAGNOSIS — Z981 Arthrodesis status: Secondary | ICD-10-CM | POA: Diagnosis not present

## 2020-10-23 DIAGNOSIS — E782 Mixed hyperlipidemia: Secondary | ICD-10-CM | POA: Diagnosis not present

## 2020-10-23 DIAGNOSIS — M4722 Other spondylosis with radiculopathy, cervical region: Secondary | ICD-10-CM | POA: Diagnosis not present

## 2020-10-23 DIAGNOSIS — M4802 Spinal stenosis, cervical region: Secondary | ICD-10-CM | POA: Diagnosis not present

## 2020-10-23 DIAGNOSIS — I10 Essential (primary) hypertension: Secondary | ICD-10-CM | POA: Diagnosis not present

## 2020-10-23 DIAGNOSIS — F17210 Nicotine dependence, cigarettes, uncomplicated: Secondary | ICD-10-CM | POA: Diagnosis not present

## 2020-10-23 DIAGNOSIS — M4322 Fusion of spine, cervical region: Secondary | ICD-10-CM | POA: Diagnosis not present

## 2020-10-23 DIAGNOSIS — D649 Anemia, unspecified: Secondary | ICD-10-CM | POA: Diagnosis not present

## 2020-10-23 DIAGNOSIS — M4712 Other spondylosis with myelopathy, cervical region: Secondary | ICD-10-CM | POA: Diagnosis not present

## 2020-10-23 DIAGNOSIS — Z419 Encounter for procedure for purposes other than remedying health state, unspecified: Secondary | ICD-10-CM

## 2020-10-23 HISTORY — PX: ANTERIOR CERVICAL DECOMP/DISCECTOMY FUSION: SHX1161

## 2020-10-23 SURGERY — ANTERIOR CERVICAL DECOMPRESSION/DISCECTOMY FUSION 2 LEVELS
Anesthesia: General | Site: Spine Cervical

## 2020-10-23 MED ORDER — HYDROMORPHONE HCL 1 MG/ML IJ SOLN
INTRAMUSCULAR | Status: AC
  Filled 2020-10-23: qty 0.5

## 2020-10-23 MED ORDER — ONDANSETRON HCL 4 MG PO TABS: 4.0000 mg | ORAL_TABLET | Freq: Four times a day (QID) | ORAL | Status: DC | PRN

## 2020-10-23 MED ORDER — SODIUM CHLORIDE 0.9% FLUSH: 3.0000 mL | INTRAVENOUS | Status: DC | PRN

## 2020-10-23 MED ORDER — ORAL CARE MOUTH RINSE: 15.0000 mL | Freq: Once | OROMUCOSAL | Status: AC

## 2020-10-23 MED ORDER — TOPIRAMATE 25 MG PO TABS
25.0000 mg | ORAL_TABLET | Freq: Every day | ORAL | Status: DC
  Administered 2020-10-23: 25 mg via ORAL
  Filled 2020-10-23: qty 1

## 2020-10-23 MED ORDER — LIDOCAINE 2% (20 MG/ML) 5 ML SYRINGE
INTRAMUSCULAR | Status: DC | PRN
  Administered 2020-10-23: 60 mg via INTRAVENOUS

## 2020-10-23 MED ORDER — ONDANSETRON HCL 4 MG/2ML IJ SOLN
INTRAMUSCULAR | Status: AC
  Filled 2020-10-23: qty 2

## 2020-10-23 MED ORDER — DIPHENHYDRAMINE HCL 50 MG/ML IJ SOLN
INTRAMUSCULAR | Status: DC | PRN
  Administered 2020-10-23: 12.5 mg via INTRAVENOUS

## 2020-10-23 MED ORDER — ATORVASTATIN CALCIUM 80 MG PO TABS: 80.0000 mg | ORAL_TABLET | Freq: Every evening | ORAL | Status: DC

## 2020-10-23 MED ORDER — PROPOFOL 10 MG/ML IV BOLUS
INTRAVENOUS | Status: DC | PRN
  Administered 2020-10-23: 170 mg via INTRAVENOUS

## 2020-10-23 MED ORDER — ONDANSETRON HCL 4 MG/2ML IJ SOLN: 4.0000 mg | Freq: Four times a day (QID) | INTRAMUSCULAR | Status: DC | PRN

## 2020-10-23 MED ORDER — FENTANYL CITRATE (PF) 250 MCG/5ML IJ SOLN
INTRAMUSCULAR | Status: AC
  Filled 2020-10-23: qty 5

## 2020-10-23 MED ORDER — CHLORHEXIDINE GLUCONATE CLOTH 2 % EX PADS: 6.0000 | MEDICATED_PAD | Freq: Once | CUTANEOUS | Status: DC

## 2020-10-23 MED ORDER — FENTANYL CITRATE (PF) 100 MCG/2ML IJ SOLN
INTRAMUSCULAR | Status: AC
  Administered 2020-10-23: 25 ug via INTRAVENOUS
  Filled 2020-10-23: qty 2

## 2020-10-23 MED ORDER — PHENOL 1.4 % MT LIQD
1.0000 | OROMUCOSAL | Status: DC | PRN
  Administered 2020-10-23: 1 via OROMUCOSAL
  Filled 2020-10-23: qty 177

## 2020-10-23 MED ORDER — CHLORHEXIDINE GLUCONATE 0.12 % MT SOLN
15.0000 mL | Freq: Once | OROMUCOSAL | Status: AC
  Administered 2020-10-23: 15 mL via OROMUCOSAL
  Filled 2020-10-23: qty 15

## 2020-10-23 MED ORDER — PROPOFOL 10 MG/ML IV BOLUS
INTRAVENOUS | Status: AC
  Filled 2020-10-23: qty 20

## 2020-10-23 MED ORDER — MIDAZOLAM HCL 2 MG/2ML IJ SOLN
INTRAMUSCULAR | Status: AC
  Filled 2020-10-23: qty 2

## 2020-10-23 MED ORDER — ROCURONIUM BROMIDE 10 MG/ML (PF) SYRINGE
PREFILLED_SYRINGE | INTRAVENOUS | Status: AC
  Filled 2020-10-23: qty 10

## 2020-10-23 MED ORDER — HYDROXYZINE HCL 25 MG PO TABS: 25.0000 mg | ORAL_TABLET | Freq: Three times a day (TID) | ORAL | Status: DC | PRN

## 2020-10-23 MED ORDER — GABAPENTIN 600 MG PO TABS: 600.0000 mg | ORAL_TABLET | Freq: Three times a day (TID) | ORAL | Status: DC

## 2020-10-23 MED ORDER — SERTRALINE HCL 50 MG PO TABS
100.0000 mg | ORAL_TABLET | Freq: Every evening | ORAL | Status: DC
  Administered 2020-10-23: 100 mg via ORAL
  Filled 2020-10-23: qty 2

## 2020-10-23 MED ORDER — LIDOCAINE 2% (20 MG/ML) 5 ML SYRINGE
INTRAMUSCULAR | Status: AC
  Filled 2020-10-23: qty 5

## 2020-10-23 MED ORDER — HEMOSTATIC AGENTS (NO CHARGE) OPTIME
TOPICAL | Status: DC | PRN
  Administered 2020-10-23: 1 via TOPICAL

## 2020-10-23 MED ORDER — LACTATED RINGERS IV SOLN: INTRAVENOUS | Status: DC

## 2020-10-23 MED ORDER — CEFAZOLIN SODIUM-DEXTROSE 2-4 GM/100ML-% IV SOLN
2.0000 g | INTRAVENOUS | Status: AC
  Administered 2020-10-23: 2 g via INTRAVENOUS
  Filled 2020-10-23: qty 100

## 2020-10-23 MED ORDER — FENTANYL CITRATE (PF) 250 MCG/5ML IJ SOLN
INTRAMUSCULAR | Status: DC | PRN
  Administered 2020-10-23: 50 ug via INTRAVENOUS
  Administered 2020-10-23: 100 ug via INTRAVENOUS
  Administered 2020-10-23 (×5): 50 ug via INTRAVENOUS
  Administered 2020-10-23: 100 ug via INTRAVENOUS

## 2020-10-23 MED ORDER — ADULT MULTIVITAMIN W/MINERALS CH: 1.0000 | ORAL_TABLET | Freq: Every day | ORAL | Status: DC

## 2020-10-23 MED ORDER — TRAZODONE HCL 100 MG PO TABS: 100.0000 mg | ORAL_TABLET | Freq: Every evening | ORAL | Status: DC | PRN

## 2020-10-23 MED ORDER — DEXMEDETOMIDINE (PRECEDEX) IN NS 20 MCG/5ML (4 MCG/ML) IV SYRINGE
PREFILLED_SYRINGE | INTRAVENOUS | Status: AC
  Filled 2020-10-23: qty 5

## 2020-10-23 MED ORDER — AMLODIPINE BESYLATE 5 MG PO TABS
5.0000 mg | ORAL_TABLET | Freq: Every morning | ORAL | Status: DC
  Administered 2020-10-24: 5 mg via ORAL
  Filled 2020-10-23: qty 1

## 2020-10-23 MED ORDER — DEXAMETHASONE SODIUM PHOSPHATE 10 MG/ML IJ SOLN
10.0000 mg | Freq: Once | INTRAMUSCULAR | Status: AC
  Administered 2020-10-23: 10 mg via INTRAVENOUS
  Filled 2020-10-23: qty 1

## 2020-10-23 MED ORDER — HYDROMORPHONE HCL 1 MG/ML IJ SOLN: 1.0000 mg | INTRAMUSCULAR | Status: DC | PRN

## 2020-10-23 MED ORDER — MIDAZOLAM HCL 2 MG/2ML IJ SOLN
INTRAMUSCULAR | Status: DC | PRN
  Administered 2020-10-23: 2 mg via INTRAVENOUS

## 2020-10-23 MED ORDER — CYCLOBENZAPRINE HCL 10 MG PO TABS
ORAL_TABLET | ORAL | Status: AC
  Administered 2020-10-23: 10 mg via ORAL
  Filled 2020-10-23: qty 1

## 2020-10-23 MED ORDER — ROCURONIUM BROMIDE 10 MG/ML (PF) SYRINGE
PREFILLED_SYRINGE | INTRAVENOUS | Status: DC | PRN
  Administered 2020-10-23: 100 mg via INTRAVENOUS

## 2020-10-23 MED ORDER — MENTHOL 3 MG MT LOZG: 1.0000 | LOZENGE | OROMUCOSAL | Status: DC | PRN

## 2020-10-23 MED ORDER — OXYCODONE-ACETAMINOPHEN 5-325 MG PO TABS
1.0000 | ORAL_TABLET | ORAL | Status: DC | PRN
Start: 2020-10-23 — End: 2020-10-24
  Administered 2020-10-23: 1 via ORAL
  Administered 2020-10-23 – 2020-10-24 (×3): 2 via ORAL
  Filled 2020-10-23 (×3): qty 2
  Filled 2020-10-23: qty 1

## 2020-10-23 MED ORDER — DEXMEDETOMIDINE (PRECEDEX) IN NS 20 MCG/5ML (4 MCG/ML) IV SYRINGE
PREFILLED_SYRINGE | INTRAVENOUS | Status: DC | PRN
  Administered 2020-10-23 (×2): 20 ug via INTRAVENOUS

## 2020-10-23 MED ORDER — SODIUM CHLORIDE 0.9% FLUSH
3.0000 mL | Freq: Two times a day (BID) | INTRAVENOUS | Status: DC
  Administered 2020-10-23 (×2): 3 mL via INTRAVENOUS

## 2020-10-23 MED ORDER — DEXAMETHASONE SODIUM PHOSPHATE 10 MG/ML IJ SOLN
INTRAMUSCULAR | Status: AC
  Filled 2020-10-23: qty 1

## 2020-10-23 MED ORDER — FENTANYL CITRATE (PF) 100 MCG/2ML IJ SOLN: 25.0000 ug | INTRAMUSCULAR | Status: DC | PRN

## 2020-10-23 MED ORDER — ONDANSETRON HCL 4 MG/2ML IJ SOLN
INTRAMUSCULAR | Status: DC | PRN
  Administered 2020-10-23: 4 mg via INTRAVENOUS

## 2020-10-23 MED ORDER — ACETAMINOPHEN 10 MG/ML IV SOLN
INTRAVENOUS | Status: DC | PRN
  Administered 2020-10-23: 1000 mg via INTRAVENOUS

## 2020-10-23 MED ORDER — SODIUM CHLORIDE 0.9 % IV SOLN
1.0000 g | Freq: Three times a day (TID) | INTRAVENOUS | Status: AC
  Administered 2020-10-23 – 2020-10-24 (×2): 1 g via INTRAVENOUS
  Filled 2020-10-23 (×2): qty 10

## 2020-10-23 MED ORDER — SODIUM CHLORIDE 0.9 % IV SOLN
250.0000 mL | INTRAVENOUS | Status: DC
  Administered 2020-10-23: 250 mL via INTRAVENOUS

## 2020-10-23 MED ORDER — GABAPENTIN 300 MG PO CAPS
900.0000 mg | ORAL_CAPSULE | Freq: Three times a day (TID) | ORAL | Status: DC
  Administered 2020-10-23 (×2): 900 mg via ORAL
  Filled 2020-10-23 (×2): qty 3

## 2020-10-23 MED ORDER — ACETAMINOPHEN 10 MG/ML IV SOLN
INTRAVENOUS | Status: AC
  Filled 2020-10-23: qty 100

## 2020-10-23 MED ORDER — SUGAMMADEX SODIUM 200 MG/2ML IV SOLN
INTRAVENOUS | Status: DC | PRN
  Administered 2020-10-23: 200 mg via INTRAVENOUS

## 2020-10-23 MED ORDER — 0.9 % SODIUM CHLORIDE (POUR BTL) OPTIME
TOPICAL | Status: DC | PRN
  Administered 2020-10-23: 1000 mL

## 2020-10-23 MED ORDER — TIZANIDINE HCL 4 MG PO TABS
4.0000 mg | ORAL_TABLET | Freq: Four times a day (QID) | ORAL | Status: DC | PRN
  Administered 2020-10-24: 4 mg via ORAL
  Filled 2020-10-23: qty 1

## 2020-10-23 MED ORDER — THROMBIN 5000 UNITS EX SOLR
CUTANEOUS | Status: AC
  Filled 2020-10-23: qty 10000

## 2020-10-23 MED ORDER — ACETAMINOPHEN 325 MG PO TABS: 650.0000 mg | ORAL_TABLET | ORAL | Status: DC | PRN

## 2020-10-23 MED ORDER — ACETAMINOPHEN 650 MG RE SUPP: 650.0000 mg | RECTAL | Status: DC | PRN

## 2020-10-23 MED ORDER — FENTANYL CITRATE (PF) 100 MCG/2ML IJ SOLN
25.0000 ug | INTRAMUSCULAR | Status: DC | PRN
  Administered 2020-10-23 (×3): 25 ug via INTRAVENOUS

## 2020-10-23 MED ORDER — HYDROMORPHONE HCL 1 MG/ML IJ SOLN
INTRAMUSCULAR | Status: DC | PRN
  Administered 2020-10-23: .5 mg via INTRAVENOUS

## 2020-10-23 MED ORDER — CYCLOBENZAPRINE HCL 10 MG PO TABS: 10.0000 mg | ORAL_TABLET | Freq: Three times a day (TID) | ORAL | Status: DC | PRN

## 2020-10-23 MED ORDER — THROMBIN 5000 UNITS EX SOLR
CUTANEOUS | Status: DC | PRN
  Administered 2020-10-23 (×2): 5000 [IU] via TOPICAL

## 2020-10-23 SURGICAL SUPPLY — 52 items
BAG DECANTER FOR FLEXI CONT (MISCELLANEOUS) ×2 IMPLANT
BAND RUBBER #18 3X1/16 STRL (MISCELLANEOUS) ×4 IMPLANT
BENZOIN TINCTURE PRP APPL 2/3 (GAUZE/BANDAGES/DRESSINGS) ×2 IMPLANT
BIT DRILL 13 (BIT) ×2 IMPLANT
BUR MATCHSTICK NEURO 3.0 LAGG (BURR) ×2 IMPLANT
CAGE PEEK 6X14X11 (Cage) ×4 IMPLANT
CANISTER SUCT 3000ML PPV (MISCELLANEOUS) ×2 IMPLANT
CARTRIDGE OIL MAESTRO DRILL (MISCELLANEOUS) ×1 IMPLANT
COVER WAND RF STERILE (DRAPES) ×2 IMPLANT
DIFFUSER DRILL AIR PNEUMATIC (MISCELLANEOUS) ×2 IMPLANT
DRAPE C-ARM 42X72 X-RAY (DRAPES) ×4 IMPLANT
DRAPE LAPAROTOMY 100X72 PEDS (DRAPES) ×2 IMPLANT
DRAPE MICROSCOPE LEICA (MISCELLANEOUS) ×2 IMPLANT
DURAPREP 6ML APPLICATOR 50/CS (WOUND CARE) ×2 IMPLANT
ELECT COATED BLADE 2.86 ST (ELECTRODE) ×2 IMPLANT
ELECT REM PT RETURN 9FT ADLT (ELECTROSURGICAL) ×2
ELECTRODE REM PT RTRN 9FT ADLT (ELECTROSURGICAL) ×1 IMPLANT
GAUZE 4X4 16PLY RFD (DISPOSABLE) IMPLANT
GAUZE SPONGE 4X4 12PLY STRL (GAUZE/BANDAGES/DRESSINGS) ×2 IMPLANT
GLOVE ECLIPSE 9.0 STRL (GLOVE) ×2 IMPLANT
GLOVE EXAM NITRILE XL STR (GLOVE) IMPLANT
GOWN STRL REUS W/ TWL LRG LVL3 (GOWN DISPOSABLE) IMPLANT
GOWN STRL REUS W/ TWL XL LVL3 (GOWN DISPOSABLE) ×1 IMPLANT
GOWN STRL REUS W/TWL 2XL LVL3 (GOWN DISPOSABLE) IMPLANT
GOWN STRL REUS W/TWL LRG LVL3 (GOWN DISPOSABLE)
GOWN STRL REUS W/TWL XL LVL3 (GOWN DISPOSABLE) ×2
HALTER HD/CHIN CERV TRACTION D (MISCELLANEOUS) ×2 IMPLANT
HEMOSTAT POWDER KIT SURGIFOAM (HEMOSTASIS) IMPLANT
HEMOSTAT SURGICEL 2X14 (HEMOSTASIS) IMPLANT
KIT BASIN OR (CUSTOM PROCEDURE TRAY) ×2 IMPLANT
KIT TURNOVER KIT B (KITS) ×2 IMPLANT
NEEDLE SPNL 20GX3.5 QUINCKE YW (NEEDLE) ×2 IMPLANT
NS IRRIG 1000ML POUR BTL (IV SOLUTION) ×2 IMPLANT
OIL CARTRIDGE MAESTRO DRILL (MISCELLANEOUS) ×2
PACK LAMINECTOMY NEURO (CUSTOM PROCEDURE TRAY) ×2 IMPLANT
PAD ARMBOARD 7.5X6 YLW CONV (MISCELLANEOUS) ×6 IMPLANT
PLATE VISION ELITE 40MM (Plate) ×2 IMPLANT
SCREW ST 13X4XST VA NS SPNE (Screw) ×6 IMPLANT
SCREW ST VAR 4 ATL (Screw) ×12 IMPLANT
SPACER SPNL 11X14X6XPEEK CVD (Cage) ×2 IMPLANT
SPCR SPNL 11X14X6XPEEK CVD (Cage) ×2 IMPLANT
SPONGE INTESTINAL PEANUT (DISPOSABLE) ×2 IMPLANT
SPONGE SURGIFOAM ABS GEL SZ50 (HEMOSTASIS) ×2 IMPLANT
STRIP CLOSURE SKIN 1/2X4 (GAUZE/BANDAGES/DRESSINGS) ×2 IMPLANT
SUT VIC AB 3-0 SH 8-18 (SUTURE) ×2 IMPLANT
SUT VIC AB 4-0 RB1 18 (SUTURE) ×2 IMPLANT
TAPE CLOTH 4X10 WHT NS (GAUZE/BANDAGES/DRESSINGS) ×2 IMPLANT
TAPE CLOTH SURG 4X10 WHT LF (GAUZE/BANDAGES/DRESSINGS) ×2 IMPLANT
TOWEL GREEN STERILE (TOWEL DISPOSABLE) ×2 IMPLANT
TOWEL GREEN STERILE FF (TOWEL DISPOSABLE) ×2 IMPLANT
TRAP SPECIMEN MUCUS 40CC (MISCELLANEOUS) ×2 IMPLANT
WATER STERILE IRR 1000ML POUR (IV SOLUTION) ×2 IMPLANT

## 2020-10-23 NOTE — Anesthesia Postprocedure Evaluation (Signed)
Anesthesia Post Note  Patient: [name]  Procedure(s) Performed: CERVICAL FIVE-SIX, CERVICAL SIX-SEVEN ANTERIOR CERVICAL DECOMPRESSION/DISCECTOMY FUSION (N/A Spine Cervical)     Patient location during evaluation: PACU Anesthesia Type: General Level of consciousness: awake Pain management: pain level controlled Vital Signs Assessment: post-procedure vital signs reviewed and stable Respiratory status: spontaneous breathing Cardiovascular status: stable Postop Assessment: no apparent nausea or vomiting Anesthetic complications: no   No complications documented.  Last Vitals:  Vitals:   10/23/20 0803 10/23/20 1235  BP: 129/87 113/77  Pulse: 77 82  Resp: 17 20  Temp: 37.1 C 36.9 C  SpO2: 96% 97%    Last Pain:  Vitals:   10/23/20 1255  TempSrc:   PainSc: 8       LLE Sensation: No numbness;No pain;No tingling (10/23/20 1255)   RLE Sensation: No numbness;No pain;No tingling (10/23/20 1255)      [place]

## 2020-10-23 NOTE — Brief Op Note (Signed)
10/23/2020  12:12 PM  PATIENT:  [name]  45 y.o. female  PRE-OPERATIVE DIAGNOSIS:  Stenosis  POST-OPERATIVE DIAGNOSIS:  Stenosis  PROCEDURE:  Procedure(s): CERVICAL FIVE-SIX, CERVICAL SIX-SEVEN ANTERIOR CERVICAL DECOMPRESSION/DISCECTOMY FUSION (N/A)  SURGEON:  Surgeon(s) and Role:    * Earnie Larsson, MD - Primary  PHYSICIAN ASSISTANT:   ASSISTANTSMearl Latin   ANESTHESIA:   general  EBL:  100 mL   BLOOD ADMINISTERED:none  DRAINS: none   LOCAL MEDICATIONS USED:  NONE  SPECIMEN:  No Specimen  DISPOSITION OF SPECIMEN:  N/A  COUNTS:  YES  TOURNIQUET:  * No tourniquets in log *  DICTATION: .Dragon Dictation  PLAN OF CARE: Admit for overnight observation  PATIENT DISPOSITION:  PACU - hemodynamically stable.   Delay start of Pharmacological VTE agent (>24hrs) due to surgical blood loss or risk of bleeding: yes

## 2020-10-23 NOTE — Transfer of Care (Signed)
Immediate Anesthesia Transfer of Care Note  Patient: [name]  Procedure(s) Performed: CERVICAL FIVE-SIX, CERVICAL SIX-SEVEN ANTERIOR CERVICAL DECOMPRESSION/DISCECTOMY FUSION (N/A Spine Cervical)  Patient Location: PACU  Anesthesia Type:General  Level of Consciousness: awake, alert  and oriented  Airway & Oxygen Therapy: Patient Spontanous Breathing and Patient connected to face mask oxygen  Post-op Assessment: Report given to RN, Post -op Vital signs reviewed and stable and Patient moving all extremities X 4  Post vital signs: Reviewed and stable  Last Vitals:  Vitals Value Taken Time  BP 113/77 10/23/20 1235  Temp    Pulse 86 10/23/20 1237  Resp 19 10/23/20 1237  SpO2 98 % 10/23/20 1237  Vitals shown include unvalidated device data.  Last Pain:  Vitals:   10/23/20 0826  TempSrc:   PainSc: 5       Patients Stated Pain Goal: 3 (43/88/87 5797)  Complications: No complications documented.

## 2020-10-23 NOTE — Anesthesia Procedure Notes (Signed)
Procedure Name: Intubation Date/Time: 10/23/2020 10:09 AM Performed by: Babs Bertin, CRNA Pre-anesthesia Checklist: Patient identified, Emergency Drugs available, Suction available and Patient being monitored Patient Re-evaluated:Patient Re-evaluated prior to induction Oxygen Delivery Method: Circle System Utilized Preoxygenation: Pre-oxygenation with 100% oxygen Induction Type: IV induction Ventilation: Mask ventilation without difficulty Laryngoscope Size: Glidescope and 3 Grade View: Grade I Tube type: Oral Tube size: 7.0 mm Number of attempts: 1 Airway Equipment and Method: Stylet and Oral airway Placement Confirmation: ETT inserted through vocal cords under direct vision,  positive ETCO2 and breath sounds checked- equal and bilateral Secured at: 21 cm Tube secured with: Tape Dental Injury: Teeth and Oropharynx as per pre-operative assessment

## 2020-10-23 NOTE — Op Note (Signed)
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  Asst. Surgeon: Reinaldo Meeker, NP  Anesthesia: General  Indication: 45 year old female with significant neck and bilateral upper extremity symptoms left greater than right including numbness paresthesias and weakness which gradually progressed with time and failed conservative management her work-up demonstrates evidence of marked disc generation with associated spondylosis at C5-6 and C6-7 causing significant central canal stenosis and spinal cord compression with severe neural foraminal stenosis at both C5-6 and C6-7.  Patient presents now for two-level anterior cervical decompression and fusion in hopes of improving her symptoms.  Operative note: After induction of anesthesia, patient positioned supine with neck slightly extended and held placed halter traction.  Patient's anterior cervical region prepped and draped sterilely.  Incision made overlying C6.  Dissection performed on the right.  Retractor placed.  Fluoroscopy used.  Levels confirmed.  Disc base was incised.  Discectomy performed using various instruments down to level the posterior annulus.  Microscope was brought to the field used throughout the remainder of the discectomy.  Remaining aspects of annulus and osteophytes removed using high-speed drill down to the level of posterior logical ligament.  Posterior logical was elevated and resected.  Underlying thecal sac was identified.  A wide central decompression then performed undercutting the bodies of C5 and C6.  Decompression then proceeded each neural foramina.  Wide anterior foraminotomies were  performed on the course exiting C6 nerve roots bilaterally.  At this point a very thorough discectomy and decompression of been achieved.  There was no evidence of injury to thecal sac or nerve roots.  Procedure then repeated at C6-7 again without complication.  Wounds were we then irrigated.  Gelfoam was placed topically for hemostasis then removed.  6 mm [employer] anatomic peek cages were packed with locally harvested autograft.  Each cage was then impacted in place and recessed slightly from the anterior cortical margin.  40 mm Atlantis anterior cervical plate was then placed over the C5, C6 and C7 levels.  This then attached under fluoroscopic guidance using 13 mm variable angle screws to each at all 3 levels.  All 6 screws given final tightening found to be solidly within the bone.  Locking screws engaged in all levels.  Final images reveal good position of cages and the hardware at the proper operative level with normal alignment of spine.  Hemostasis was then achieved with bipolar cautery.  Wounds and closed in layers with Vicryl sutures.  Steri-Strips and sterile dressing were applied.  No apparent complications.  Patient tolerated the procedure well and she returns to the recovery room postop.

## 2020-10-23 NOTE — H&P (Signed)
[name] is an 45 y.o. female.   Chief Complaint: Neck pain HPI: 45 year old female with neck pain with radiation to her upper extremities left greater than right.  Patient with progressive numbness and some degree of weakness.  Patient has failed conservative management.  Work-up demonstrates evidence of severe disc degeneration with spondylosis and some degree of retrolisthesis at C5-6 and C6-7 with severe foraminal stenosis.  Patient presents now for two-level anterior cervical decompression and fusion in hopes of improving her symptoms.  Past Medical History:  Diagnosis Date  . Abnormal uterine bleeding (AUB) 01/21/2014  . Adenomyosis 01/21/2014  . Anemia   . Anxiety    no meds  . Depression    no meds  . Encounter for female sterilization procedure 12/06/2011  . Endometrioma of ovary 08/04/2015  . Fibroids   . Fibromyalgia    no meds  . History of preterm labor   . History of recurrent UTI (urinary tract infection)   . Intramural leiomyoma of uterus 01/21/2014  . Mild preeclampsia 09/20/2011   Hix with 2013 pregnancy  PIH - resolved, no longer an issue  . Obese   . Pelvic pain in female 01/21/2014  . Polyhydramnios 09/20/2011  . S/P cesarean section 09/20/2011  . S/P tubal ligation 12/07/2011  . SVD (spontaneous vaginal delivery)    x 2  . Uterine fibroids affecting pregnancy   . Vaginal delivery 1995, 1998    Past Surgical History:  Procedure Laterality Date  . CESAREAN SECTION  09/20/2011   Procedure: CESAREAN SECTION;  Surgeon: Thornell Sartorius, MD;  Location: [facility];  Service: Gynecology;  Laterality: N/A;  . CHOLECYSTECTOMY     Lap  . KNEE SURGERY     right - dislocated knee cap  . LAPAROSCOPIC ASSISTED VAGINAL HYSTERECTOMY N/A 01/22/2014   Procedure: LAPAROSCOPIC ASSISTED VAGINAL HYSTERECTOMY, Bilateral Salpingectomy;  Surgeon: Janyth Contes, MD;  Location: [facility];  Service: Gynecology;  Laterality: N/A;  . LAPAROSCOPIC LYSIS OF ADHESIONS  08/05/2015   Procedure:  LAPAROSCOPIC LYSIS OF ADHESIONS;  Surgeon: Janyth Contes, MD;  Location: [facility];  Service: Gynecology;;  . LAPAROSCOPIC OVARIAN CYSTECTOMY  08/05/2015   Procedure: LAPAROSCOPY;  Surgeon: Janyth Contes, MD;  Location: [facility];  Service: Gynecology;;  . LAPAROSCOPIC TUBAL LIGATION  12/07/2011   Procedure: LAPAROSCOPIC TUBAL LIGATION;  Surgeon: Thornell Sartorius, MD;  Location: [facility];  Service: Gynecology;  Laterality: Bilateral;  . OOPHORECTOMY Right 08/05/2015   Procedure: OOPHORECTOMY;  Surgeon: Janyth Contes, MD;  Location: [facility];  Service: Gynecology;  Laterality: Right;    Family History  Problem Relation Age of Onset  . Cancer Mother        breast  . Hypertension Mother   . Breast cancer Mother   . Heart disease Father   . Hypertension Father   . Asthma Sister   . Mental illness Sister        bipolar, schizophrenia,   . Ovarian cysts Sister   . Seizures Sister   . Allergic rhinitis Sister   . Diabetes Maternal Grandmother   . Depression Maternal Grandfather        suicide   Social History:  reports that she has been smoking cigarettes. She has been smoking about 0.50 packs per day. She has never used smokeless tobacco. She reports previous alcohol use. She reports that she does not use drugs.  Allergies:  Allergies  Allergen Reactions  . Latex Itching    Positive Skin Testing at allergist.  . Hydrocodone-Acetaminophen Itching  and Nausea And Vomiting    And dizziness    Medications Prior to Admission  Medication Sig Dispense Refill  . acetaminophen (TYLENOL) 500 MG tablet Take 500-1,000 mg by mouth every 6 (six) hours as needed (for pain.).    Marland Kitchen amLODipine (NORVASC) 5 MG tablet Take 1 tablet (5 mg total) by mouth daily. (Patient taking differently: Take 5 mg by mouth in the morning.) 90 tablet 3  . atorvastatin (LIPITOR) 80 MG tablet TAKE 1 TABLET BY MOUTH DAILY. (Patient taking differently: Take 80 mg by mouth every evening.) 30 tablet 1  . diclofenac  Sodium (VOLTAREN) 1 % GEL Apply 2 g topically 4 (four) times daily. (Patient taking differently: Apply 2 g topically 4 (four) times daily as needed (pain).) 50 g 1  . gabapentin (NEURONTIN) 300 MG capsule Start with 1 capsule at nighttime and increase to 3 times daily as needed. (Patient taking differently: Take 300 mg by mouth 3 (three) times daily. 300 mg + 600 mg=900 mg three times daily) 90 capsule 2  . gabapentin (NEURONTIN) 600 MG tablet TAKE 1 TABLET BY MOUTH 3 TIMES DAILY. (Patient taking differently: Take 600 mg by mouth 3 (three) times daily. 600 mg + 300 mg=900 mg three times daily) 90 tablet 5  . hydrOXYzine (ATARAX/VISTARIL) 25 MG tablet Take 25 mg by mouth 3 (three) times daily as needed for anxiety.    . Multiple Vitamin (MULTIVITAMIN WITH MINERALS) TABS tablet Take 1 tablet by mouth daily at 2 PM. Women's Multivitamin    . sertraline (ZOLOFT) 100 MG tablet Take 100 mg by mouth every evening.    Marland Kitchen tiZANidine (ZANAFLEX) 4 MG tablet Take 1 tablet (4 mg total) by mouth every 8 (eight) hours as needed for muscle spasms. (Patient taking differently: Take 4 mg by mouth every 6 (six) hours as needed for muscle spasms.) 60 tablet 0  . topiramate (TOPAMAX) 25 MG tablet Take 25 mg by mouth in the morning and at bedtime.    . predniSONE (DELTASONE) 10 MG tablet Use as directed per doctors orders. (Patient not taking: No sig reported) 21 tablet 0  . traZODone (DESYREL) 100 MG tablet Take 100 mg by mouth at bedtime as needed for sleep.      Results for orders placed or performed during the hospital encounter of 10/21/20 (from the past 48 hour(s))  Basic metabolic panel     Status: None   Collection Time: 10/21/20 11:26 AM  Result Value Ref Range   Sodium 137 135 - 145 mmol/L   Potassium 3.6 3.5 - 5.1 mmol/L   Chloride 107 98 - 111 mmol/L   CO2 23 22 - 32 mmol/L   Glucose, Bld 98 70 - 99 mg/dL    Comment: Glucose reference range applies only to samples taken after fasting for at least 8 hours.    BUN 8 6 - 20 mg/dL   Creatinine, Ser 0.63 0.44 - 1.00 mg/dL   Calcium 9.2 8.9 - 10.3 mg/dL   GFR, Estimated >60 >60 mL/min    Comment: (NOTE) Calculated using the CKD-EPI Creatinine Equation (2021)    Anion gap 7 5 - 15    Comment: Performed at [facility] [address], [place], [place] [address]  CBC WITH DIFFERENTIAL     Status: Abnormal   Collection Time: 10/21/20 11:26 AM  Result Value Ref Range   WBC 15.5 (H) 4.0 - 10.5 K/uL   RBC 4.75 3.87 - 5.11 MIL/uL   Hemoglobin 14.6 12.0 -  15.0 g/dL   HCT 43.6 36.0 - 46.0 %   MCV 91.8 80.0 - 100.0 fL   MCH 30.7 26.0 - 34.0 pg   MCHC 33.5 30.0 - 36.0 g/dL   RDW 12.8 11.5 - 15.5 %   Platelets 284 150 - 400 K/uL   nRBC 0.0 0.0 - 0.2 %   Neutrophils Relative % 65 %   Neutro Abs 10.2 (H) 1.7 - 7.7 K/uL   Lymphocytes Relative 26 %   Lymphs Abs 4.0 0.7 - 4.0 K/uL   Monocytes Relative 5 %   Monocytes Absolute 0.8 0.1 - 1.0 K/uL   Eosinophils Relative 2 %   Eosinophils Absolute 0.3 0.0 - 0.5 K/uL   Basophils Relative 1 %   Basophils Absolute 0.1 0.0 - 0.1 K/uL   Immature Granulocytes 1 %   Abs Immature Granulocytes 0.12 (H) 0.00 - 0.07 K/uL    Comment: Performed at [facility] [address], [place], [place] [address]  SARS CORONAVIRUS 2 (TAT 6-24 HRS) Nasopharyngeal Nasopharyngeal Swab     Status: None   Collection Time: 10/21/20 11:26 AM   Specimen: Nasopharyngeal Swab  Result Value Ref Range   SARS Coronavirus 2 NEGATIVE NEGATIVE    Comment: (NOTE) SARS-CoV-2 target nucleic acids are NOT DETECTED.  The SARS-CoV-2 RNA is generally detectable in upper and lower respiratory specimens during the acute phase of infection. Negative results do not preclude SARS-CoV-2 infection, do not rule out co-infections with other pathogens, and should not be used as the sole basis for treatment or other patient management decisions. Negative results must be combined with clinical observations, patient history, and  epidemiological information. The expected result is Negative.  Fact Sheet for Patients: SugarRoll.be  Fact Sheet for Healthcare Providers: https://www.woods-mathews.com/  This test is not yet approved or cleared by the [place] FDA and  has been authorized for detection and/or diagnosis of SARS-CoV-2 by FDA under an Emergency Use Authorization (EUA). This EUA will remain  in effect (meaning this test can be used) for the duration of the COVID-19 declaration under Se ction 564(b)(1) of the Act, 21 U.S.C. section 360bbb-3(b)(1), unless the authorization is terminated or revoked sooner.  Performed at [facility], [place] [address], [place], [place] [address]   Surgical pcr screen     Status: Abnormal   Collection Time: 10/21/20 11:27 AM   Specimen: Nasal Mucosa; Nasal Swab  Result Value Ref Range   MRSA, PCR NEGATIVE NEGATIVE   Staphylococcus aureus POSITIVE (A) NEGATIVE    Comment: (NOTE) The Xpert SA Assay (FDA approved for NASAL specimens in patients 73 years of age and older), is one component of a comprehensive surveillance program. It is not intended to diagnose infection nor to guide or monitor treatment. Performed at [facility], [place] [name] [place], [place] [address]    No results found.  Pertinent items noted in HPI and remainder of comprehensive ROS otherwise negative.  Blood pressure 129/87, pulse 77, temperature 98.7 F (37.1 C), temperature source Oral, resp. rate 17, height 5\' 8"  (1.727 m), weight 102.5 kg, last menstrual period 01/08/2014, SpO2 96 %.  Patient is awake and alert.  She is oriented and appropriate.  Speech is fluent.  Judgment insight are intact.  Cranial nerve function normal bilateral.  Motor examination reveals some mild weakness of her wrist extensors on the left.  Otherwise motor strength intact.  Sensory examination reveals decreased sensation pinprick and light touch in her left C6  and C7 dermatomes.  Deep in her EXTR normal active.  No evidence of long track signs.  Gait is normal.  Posture is normal peer examination head ears eyes nose throat is unremarkable her chest and abdomen are benign.  Extremities are free from injury or deformity. Assessment/Plan C5-6, C6-7 spondylosis with radiculopathy.  Plan C5-6, C6-7 anterior cervical segment fusion.  Risks and benefits of been explained.  Patient wishes to proceed.  Mallie Mussel A Laymond Postle 10/23/2020, 9:41 AM

## 2020-10-24 DIAGNOSIS — M4722 Other spondylosis with radiculopathy, cervical region: Secondary | ICD-10-CM | POA: Diagnosis not present

## 2020-10-24 MED ORDER — OXYCODONE-ACETAMINOPHEN 5-325 MG PO TABS: 1.0000 | ORAL_TABLET | ORAL | 0 refills | Status: DC | PRN

## 2020-10-24 NOTE — Evaluation (Signed)
Occupational Therapy Evaluation Patient Details Name: [name] DOBY MRN: [id number] DOB: April 16, 1976 Today's Date: 10/24/2020    History of Present Illness 45 yo F underwent C5-6 and C6-7 anterior cervical discectomy, fusion and plating on the patient on 10/23/2020.   Clinical Impression   Patient admitted for the diagnosis and procedure above.  Patient is essentially at her baseline for ADL and mobility.  Good adherence to precautions, all questions answered, precaution sheet reviewed, and no further acute or post acute needs identified.      Follow Up Recommendations  No OT follow up    Equipment Recommendations  None recommended by OT    Recommendations for Other Services       Precautions / Restrictions Precautions Precautions: Fall;Cervical Precaution Booklet Issued: Yes (comment) Required Braces or Orthoses: Cervical Brace Cervical Brace: Soft collar;For comfort Restrictions Weight Bearing Restrictions: No      Mobility Bed Mobility Overal bed mobility: Modified Independent                  Transfers Overall transfer level: Independent Equipment used: None                  Balance Overall balance assessment: No apparent balance deficits (not formally assessed)                                         ADL either performed or assessed with clinical judgement   ADL Overall ADL's : At baseline                                       General ADL Comments: Able to complete all AD form modified sit/stand level.  Up and walking the halls without AD or deficits.     Vision Baseline Vision/History: No visual deficits Patient Visual Report: No change from baseline       Perception     Praxis      Pertinent Vitals/Pain Pain Assessment: Faces Faces Pain Scale: Hurts little more Pain Location: surgical site Pain Descriptors / Indicators: Operative site guarding Pain Intervention(s): Monitored during session     Hand  Dominance Right   Extremity/Trunk Assessment Upper Extremity Assessment Upper Extremity Assessment: Overall WFL for tasks assessed   Lower Extremity Assessment Lower Extremity Assessment: Overall WFL for tasks assessed   Cervical / Trunk Assessment Cervical / Trunk Assessment: Other exceptions Cervical / Trunk Exceptions: c spine surgery   Communication Communication Communication: No difficulties   Cognition Arousal/Alertness: Awake/alert Behavior During Therapy: WFL for tasks assessed/performed Overall Cognitive Status: Within Functional Limits for tasks assessed                                     General Comments       Exercises     Shoulder Instructions      Home Living Family/patient expects to be discharged to:: Private residence Living Arrangements: Parent Available Help at Discharge: Family;Available 24 hours/day Type of Home: House       Home Layout: One level     Bathroom Shower/Tub: Teacher, early years/pre: Standard     Home Equipment: Shower seat          Prior Functioning/Environment Level of Independence:  Independent                 OT Problem List: Pain      OT Treatment/Interventions:      OT Goals(Current goals can be found in the care plan section) Acute Rehab OT Goals Patient Stated Goal: To be pain free OT Goal Formulation: With patient Time For Goal Achievement: 10/24/20 Potential to Achieve Goals: Good  OT Frequency:     Barriers to D/C:            Co-evaluation              AM-PAC OT "6 Clicks" Daily Activity     Outcome Measure Help from another person eating meals?: None Help from another person taking care of personal grooming?: None Help from another person toileting, which includes using toliet, bedpan, or urinal?: None Help from another person bathing (including washing, rinsing, drying)?: None Help from another person to put on and taking off regular upper body clothing?:  None Help from another person to put on and taking off regular lower body clothing?: None 6 Click Score: 24   End of Session Equipment Utilized During Treatment: Cervical collar Nurse Communication: Mobility status  Activity Tolerance: Patient tolerated treatment well Patient left: in chair;with call bell/phone within reach  OT Visit Diagnosis: Pain                Time: 7416-3845 OT Time Calculation (min): 27 min Charges:  OT General Charges $OT Visit: 1 Visit OT Evaluation $OT Eval Moderate Complexity: 1 Mod OT Treatments $Self Care/Home Management : 8-22 mins  10/24/2020  Rich, OTR/L  Acute Rehabilitation Services  Office:  [phone]   Metta Clines 10/24/2020, 9:34 AM

## 2020-10-24 NOTE — Discharge Summary (Signed)
. Physician Discharge Summary  Patient ID: [name] MRN: [id number] DOB/AGE: [birth year] 47 y.o.  Admit date: 10/23/2020 Discharge date: 10/24/2020  Admission Diagnoses: Cervical spondylosis, cervical radiculopathy, cervical myelopathy, cervicalgia  Discharge Diagnoses: The same Active Problems:   Cervical spondylosis with myelopathy and radiculopathy   Discharged Condition: good  Hospital Course: Dr. Annette Stable performed a C5-6 and C6-7 anterior cervical discectomy, fusion and plating on the patient on 10/23/2020.  The patient's postoperative course was unremarkable.  On postoperative day #1 she requested discharge home.  She was given written and oral discharge instructions.  All her questions were answered.  Consults: PT, OT, care management Significant Diagnostic Studies: None Treatments: C5-6 and C6-7 anterior cervical discectomy, fusion and plating. Discharge Exam: Blood pressure 106/65, pulse 72, temperature 98.3 F (36.8 C), temperature source Oral, resp. rate 16, height 5\' 8"  (1.727 m), weight 102.5 kg, last menstrual period 01/08/2014, SpO2 94 %. The patient is alert and pleasant.  She looks well.  Her dressing is clean dry.  There is no hematoma or shift.  She is moving all 4 extremities well.  Disposition: Home  Discharge Instructions    Call MD for:  difficulty breathing, headache or visual disturbances   Complete by: As directed    Call MD for:  extreme fatigue   Complete by: As directed    Call MD for:  hives   Complete by: As directed    Call MD for:  persistant dizziness or light-headedness   Complete by: As directed    Call MD for:  persistant nausea and vomiting   Complete by: As directed    Call MD for:  redness, tenderness, or signs of infection (pain, swelling, redness, odor or green/yellow discharge around incision site)   Complete by: As directed    Call MD for:  severe uncontrolled pain   Complete by: As directed    Call MD for:  temperature >100.4    Complete by: As directed    Diet - low sodium heart healthy   Complete by: As directed    Discharge instructions   Complete by: As directed    Call [phone] for a followup appointment. Take a stool softener while you are using pain medications.   Driving Restrictions   Complete by: As directed    Do not drive for 2 weeks.   Increase activity slowly   Complete by: As directed    Lifting restrictions   Complete by: As directed    Do not lift more than 5 pounds. No excessive bending or twisting.   May shower / Bathe   Complete by: As directed    Remove the dressing for 3 days after surgery.  You may shower, but leave the incision alone.   Remove dressing in 48 hours   Complete by: As directed      Allergies as of 10/24/2020      Reactions   Latex Itching   Positive Skin Testing at allergist.   Hydrocodone-acetaminophen Itching, Nausea And Vomiting   And dizziness      Medication List    STOP taking these medications   acetaminophen 500 MG tablet Commonly known as: TYLENOL   predniSONE 10 MG tablet Commonly known as: DELTASONE     TAKE these medications   amLODipine 5 MG tablet Commonly known as: NORVASC Take 1 tablet (5 mg total) by mouth daily. What changed: when to take this   atorvastatin 80 MG tablet Commonly known as: LIPITOR TAKE 1 TABLET  BY MOUTH DAILY. What changed: when to take this   diclofenac Sodium 1 % Gel Commonly known as: Voltaren Apply 2 g topically 4 (four) times daily. What changed:   when to take this  reasons to take this   gabapentin 600 MG tablet Commonly known as: NEURONTIN TAKE 1 TABLET BY MOUTH 3 TIMES DAILY. What changed: additional instructions   gabapentin 300 MG capsule Commonly known as: NEURONTIN Start with 1 capsule at nighttime and increase to 3 times daily as needed. What changed:   how much to take  how to take this  when to take this  additional instructions   hydrOXYzine 25 MG tablet Commonly known as:  ATARAX/VISTARIL Take 25 mg by mouth 3 (three) times daily as needed for anxiety.   multivitamin with minerals Tabs tablet Take 1 tablet by mouth daily at 2 PM. Women's Multivitamin   oxyCODONE-acetaminophen 5-325 MG tablet Commonly known as: PERCOCET/ROXICET Take 1-2 tablets by mouth every 4 (four) hours as needed for moderate pain or severe pain.   sertraline 100 MG tablet Commonly known as: ZOLOFT Take 100 mg by mouth every evening.   tiZANidine 4 MG tablet Commonly known as: ZANAFLEX Take 1 tablet (4 mg total) by mouth every 8 (eight) hours as needed for muscle spasms. What changed: when to take this   topiramate 25 MG tablet Commonly known as: TOPAMAX Take 25 mg by mouth in the morning and at bedtime.   traZODone 100 MG tablet Commonly known as: DESYREL Take 100 mg by mouth at bedtime as needed for sleep.        Signed: Ophelia Charter 10/24/2020, 9:26 AM

## 2020-10-24 NOTE — Plan of Care (Signed)
Patient alert and oriented, voiding adequately, skin clean, dry and intact without evidence of skin break down, or symptoms of complications - no redness or edema noted, only slight tenderness at site.  Patient states pain is manageable at time of discharge. Patient has an appointment with MD in 2 weeks 

## 2020-10-25 ENCOUNTER — Encounter (HOSPITAL_COMMUNITY): Payer: Self-pay | Admitting: Neurosurgery

## 2020-10-26 ENCOUNTER — Ambulatory Visit: Payer: Medicaid Other | Admitting: Family Medicine

## 2020-10-26 ENCOUNTER — Telehealth: Payer: Self-pay

## 2020-10-26 NOTE — Telephone Encounter (Signed)
Transition Care Management Follow-up Telephone Call  Date of discharge and from where: 10/24/2020 from [facility]  How have you been since you were released from the hospital? Pt stated that she is feeling well. Pt is able to control pain with pain medication sent in after 2 layer discectomoy.   Any questions or concerns? No  Items Reviewed:  Did the pt receive and understand the discharge instructions provided? Yes   Medications obtained and verified? Yes   Other? No   Any new allergies since your discharge? No   Dietary orders reviewed? As tolerated  Do you have support at home? Yes   Functional Questionnaire: (I = Independent and D = Dependent) ADLs: I - some dependents  Bathing/Dressing- I  Meal Prep- I  Eating- I  Maintaining continence- I  Transferring/Ambulation- I  Managing Meds- I   Follow up appointments reviewed:   PCP Hospital f/u appt confirmed? No    Specialist Hospital f/u appt confirmed? Yes  Follow Up Sched with Neurosurgeon  Are transportation arrangements needed? No   If their condition worsens, is the pt aware to call PCP or go to the Emergency Dept.? Yes  Was the patient provided with contact information for the PCP's office or ED? Yes  Was to pt encouraged to call back with questions or concerns? Yes

## 2020-11-05 ENCOUNTER — Telehealth: Payer: Self-pay | Admitting: Family Medicine

## 2020-11-05 NOTE — Telephone Encounter (Signed)
..   Medicaid Managed Care   Unsuccessful Outreach Note  11/05/2020 Name: [name] MRN: [id number] DOB: [birth year]  Referred by: Wilber Oliphant, MD Reason for referral : High Risk Managed Medicaid (Attempted to reach patient today to get her scheduled for a phone visit with the [facility]. I left my name and number for her to return my call.)   An unsuccessful telephone outreach was attempted today. The patient was referred to the case management team for assistance with care management and care coordination.   Follow Up Plan: The care management team will reach out to the patient again over the next 7-14 days.   [place]

## 2020-11-18 ENCOUNTER — Other Ambulatory Visit: Payer: Self-pay | Admitting: Family Medicine

## 2020-11-24 DIAGNOSIS — I10 Essential (primary) hypertension: Secondary | ICD-10-CM | POA: Diagnosis not present

## 2020-11-24 DIAGNOSIS — Z6833 Body mass index (BMI) 33.0-33.9, adult: Secondary | ICD-10-CM | POA: Diagnosis not present

## 2020-11-24 DIAGNOSIS — M4802 Spinal stenosis, cervical region: Secondary | ICD-10-CM | POA: Diagnosis not present

## 2020-12-01 ENCOUNTER — Other Ambulatory Visit: Payer: Self-pay | Admitting: *Deleted

## 2020-12-01 ENCOUNTER — Other Ambulatory Visit: Payer: Self-pay

## 2020-12-01 NOTE — Patient Outreach (Signed)
Medicaid Managed Care   Nurse Care Manager Note  12/01/2020 Name:  [name] MRN:  [id number] DOB:  Jan 23, 1976  [name] is an 45 y.o. year old female who is a primary patient of [name], [name], MD.  The [facility] Managed Care Coordination team was consulted for assistance with:    Hypertriglyceridemia Recent spinal surgery  Ms. Calarco was given information about Medicaid Managed Care Coordination team services today. [name] agreed to services and verbal consent obtained.  Engaged with patient by telephone for initial visit in response to provider referral for case management and/or care coordination services.   Assessments/Interventions:  Review of past medical history, allergies, medications, health status, including review of consultants reports, laboratory and other test data, was performed as part of comprehensive evaluation and provision of chronic care management services.  SDOH (Social Determinants of Health) assessments and interventions performed:   Care Plan  Allergies  Allergen Reactions   Latex Itching    Positive Skin Testing at allergist.   Hydrocodone-Acetaminophen Itching and Nausea And Vomiting    And dizziness    Medications Reviewed Today     Reviewed by Melissa Montane, RN (Registered Nurse) on 12/01/20 at [place] List Status: <None>   Medication Order Taking? Sig Documenting Provider Last Dose Status Informant  amLODipine (NORVASC) 5 MG tablet [id number] Yes Take 1 tablet (5 mg total) by mouth daily.  Patient taking differently: Take 5 mg by mouth in the morning.   Wilber Oliphant, MD Taking Active   atorvastatin (LIPITOR) 80 MG tablet [id number] Yes TAKE 1 TABLET BY MOUTH DAILY.  Patient taking differently: Take 80 mg by mouth every evening.   Wilber Oliphant, MD Taking Active   busPIRone (BUSPAR) 15 MG tablet [id number] Yes Take 15 mg by mouth 3 (three) times daily as needed. [provider] Taking Active   diclofenac Sodium (VOLTAREN) 1 %  GEL [id number] Yes Apply 2 g topically 4 (four) times daily.  Patient taking differently: Apply 2 g topically 4 (four) times daily as needed (pain).   [name], DO Taking Active   gabapentin (NEURONTIN) 300 MG capsule [id number] Yes Start with 1 capsule at nighttime and increase to 3 times daily as needed.  Patient taking differently: Take 300 mg by mouth 3 (three) times daily. 300 mg + 600 mg=900 mg three times daily   Nuala Alpha, DO Taking Active   gabapentin (NEURONTIN) 600 MG tablet [id number] Yes TAKE 1 TABLET BY MOUTH 3 TIMES DAILY.  Patient taking differently: Take 600 mg by mouth 3 (three) times daily. 600 mg + 300 mg=900 mg three times daily   Benay Pike, MD Taking Active   hydrOXYzine (ATARAX/VISTARIL) 25 MG tablet [id number] Yes Take 25 mg by mouth 3 (three) times daily as needed for anxiety. [provider] Taking Active Self           Med Note (Kamon Fahr A   Tue Dec 01, 2020  9:35 AM) Taking for hives related to anxiety  Multiple Vitamin (MULTIVITAMIN WITH MINERALS) TABS tablet [id number] Yes Take 1 tablet by mouth daily at 2 PM. Women's Multivitamin [provider] Taking Active Self  oxyCODONE-acetaminophen (PERCOCET/ROXICET) 5-325 MG tablet [id number] Yes Take 1-2 tablets by mouth every 4 (four) hours as needed for moderate pain or severe pain. Newman Pies, MD Taking Active   sertraline (ZOLOFT) 100 MG tablet [id number] Yes Take 100 mg by mouth every evening. [provider] Taking Active  Self  tiZANidine (ZANAFLEX) 4 MG tablet [id number] No Take 1 tablet (4 mg total) by mouth every 8 (eight) hours as needed for muscle spasms.  Patient not taking: Reported on 12/01/2020   Nuala Alpha, DO Not Taking Active   topiramate (TOPAMAX) 25 MG tablet [id number] Yes Take 25 mg by mouth in the morning and at bedtime. [provider] Taking Active Self           Med Note (Naren Benally A   Tue Dec 01, 2020  9:29 AM) Taking one tablet  every evening only  traZODone (DESYREL) 100 MG tablet [id number] No Take 100 mg by mouth at bedtime as needed for sleep.  Patient not taking: Reported on 12/01/2020   [provider] Not Taking Active Self            Patient Active Problem List   Diagnosis Date Noted   Cervical spondylosis with myelopathy and radiculopathy 10/23/2020   Left shoulder pain 08/13/2020   Muscle spasticity 08/11/2020   BMI 34.0-34.9,adult 10/23/2019   Anxiety 10/08/2019   Healthcare maintenance 10/08/2019   Proteinuria 02/21/2018   Arthralgia of multiple joints 02/21/2018   Major depression, recurrent ([place]) 10/31/2017   HTN (hypertension) 10/31/2017   Incontinence in female 10/31/2017   Mixed hyperlipidemia 10/31/2017   PTSD (post-traumatic stress disorder) 10/30/2017    Conditions to be addressed/monitored per PCP order:  Hypertriglyceridemia and recent spinal surgery  Care Plan : General Plan of Care (Adult)  Updates made by Melissa Montane, RN since 12/01/2020 12:00 AM     Problem: Health Promotion or Disease Self-Management (General Plan of Care)      Long-Range Goal: Self-Management Plan Developed   Start Date: 12/01/2020  Expected End Date: 02/04/2021  This Visit's Progress: On track  Priority: High  Note:   Current Barriers:  Ineffective Self Health Maintenance-Ms. Hunkins is recovering from spinal surgery. She is managing her pain and activity. She understands she has to limit and space out her activity. She has a 47 year old son at home and a sister that is very helpful. She is able to manage doing her chores around the home and errands like grocery shopping. She has been making adjustments to her diet and taking Lipitor to manage hyperlipidemia over the last year. She was unable to attend her follow up visit with the PCP due to having her spinal surgery.  Unable to independently manage hyperlipidemia Currently UNABLE TO independently self manage needs related to chronic health  conditions.  Knowledge Deficits related to short term plan for care coordination needs and long term plans for chronic disease management needs Nurse Case Manager Clinical Goal(s):  patient will work with care management team to address care coordination and chronic disease management needs related to Disease Management   Interventions:  Evaluation of current treatment plan related to hyperlipidemia and patient's adherence to plan as established by provider. Advised patient to increase daily intake of fresh fruits and vegetables Reviewed medications with patient and discussed the importance of taking as directed Discussed plans with patient for ongoing care management follow up and provided patient with direct contact information for care management team Pharmacy referral for medication management Provided information on [place] Activities:  Patient will self administer medications as prescribed Patient will attend all scheduled provider appointments Patient will call pharmacy for medication refills Patient will call provider office for new concerns or questions Patient will call and schedule PCP appointment and eye exam Patient Goals: -  call to cancel if needed - keep a calendar with prescription refill dates - keep a calendar with appointment dates - schedule recommended health tests (blood work, mammogram, colonoscopy, pap test) - schedule and keep appointment for annual check-up - schedule eye exam - increase intake of fruits and vegetables - work with [facility] Pharmacist, Ovid Curd for medication management  Follow Up Plan: Telephone follow up appointment with care management team member scheduled for: 01/04/21 @ 10:30am     Follow Up:  Patient agrees to Care Plan and Follow-up.  Plan: The Managed Medicaid care management team will reach out to the patient again over the next 30 days.  Date/time of next scheduled RN care management/care coordination outreach:   01/04/21 @ 10:30am  Lurena Joiner RN, [place] RN Care Coordinator

## 2020-12-01 NOTE — Patient Instructions (Signed)
Visit Information  Ms. Zhao was given information about Medicaid Managed Care team care coordination services as a part of their [place] Medicaid benefit. [name] verbally consented to engagement with the [facility] Managed Care team.   For questions related to your [facility], please call: [phone] or visit the homepage here: [email or web address]  If you would like to schedule transportation through your [facility], please call the following number at least 2 days in advance of your appointment: [phone].   Call the [place] at [phone], at any time, 24 hours a day, 7 days a week. If you are in danger or need immediate medical attention call 911.  Ms. Neumeier - following are the goals we discussed in your visit today:   Goals Addressed             This Visit's Progress    Make and Keep All Appointments       Timeframe:  Long-Range Goal Priority:  High Start Date:   12/01/20                          Expected End Date:  02/04/21                     Follow Up Date 01/04/2021    - call to cancel if needed - keep a calendar with prescription refill dates - keep a calendar with appointment dates    Why is this important?   Part of staying healthy is seeing the doctor for follow-up care.  If you forget your appointments, there are some things you can do to stay on track.          Protect My Health       Timeframe:  Long-Range Goal Priority:  High Start Date:   12/01/20                          Expected End Date: 02/04/21                      Follow Up Date 01/04/21    - schedule recommended health tests (blood work, mammogram, colonoscopy, pap test) - schedule and keep appointment for annual check-up - schedule eye exam - increase intake of fruits and vegetables - work with [facility] Pharmacist, Ovid Curd for medication  management    Why is this important?   Screening tests can find diseases early when they are easier to treat.  Your doctor or nurse will talk with you about which tests are important for you.  Getting shots for common diseases like the flu and shingles will help prevent them.              Please see education materials related to diet provided as print materials.   The patient verbalized understanding of instructions provided today and agreed to receive a mailed copy of patient instruction and/or educational materials.  Telephone follow up appointment with Managed Medicaid care management team member scheduled for:01/04/21 @ 10:30am  Lurena Joiner RN, BSN [place] RN Care Coordinator   Following is a copy of your plan of care:  Patient Care Plan: General Plan of Care (Adult)     Problem Identified: Health Promotion or Disease Self-Management (General Plan of Care)      Long-Range Goal: Self-Management Plan Developed  Start Date: 12/01/2020  Expected End Date: 02/04/2021  This Visit's Progress: On track  Priority: High  Note:   Current Barriers:  Ineffective Self Health Maintenance-Ms. Limb is recovering from spinal surgery. She is managing her pain and activity. She understands she has to limit and space out her activity. She has a 56 year old son at home and a sister that is very helpful. She is able to manage doing her chores around the home and errands like grocery shopping. She has been making adjustments to her diet and taking Lipitor to manage hyperlipidemia over the last year. She was unable to attend her follow up visit with the PCP due to having her spinal surgery.  Unable to independently manage hyperlipidemia Currently UNABLE TO independently self manage needs related to chronic health conditions.  Knowledge Deficits related to short term plan for care coordination needs and long term plans for chronic disease management needs Nurse Case  Manager Clinical Goal(s):  patient will work with care management team to address care coordination and chronic disease management needs related to Disease Management   Interventions:  Evaluation of current treatment plan related to hyperlipidemia and patient's adherence to plan as established by provider. Advised patient to increase daily intake of fresh fruits and vegetables Reviewed medications with patient and discussed the importance of taking as directed Discussed plans with patient for ongoing care management follow up and provided patient with direct contact information for care management team Pharmacy referral for medication management Provided information on [place] Activities:  Patient will self administer medications as prescribed Patient will attend all scheduled provider appointments Patient will call pharmacy for medication refills Patient will call provider office for new concerns or questions Patient will call and schedule PCP appointment and eye exam Patient Goals: - call to cancel if needed - keep a calendar with prescription refill dates - keep a calendar with appointment dates - schedule recommended health tests (blood work, mammogram, colonoscopy, pap test) - schedule and keep appointment for annual check-up - schedule eye exam - increase intake of fruits and vegetables - work with [facility] Pharmacist, Ovid Curd for medication management  Follow Up Plan: Telephone follow up appointment with care management team member scheduled for: 01/04/21 @ 10:30am

## 2020-12-02 ENCOUNTER — Encounter: Payer: Self-pay | Admitting: Family Medicine

## 2020-12-02 ENCOUNTER — Ambulatory Visit (INDEPENDENT_AMBULATORY_CARE_PROVIDER_SITE_OTHER): Payer: Medicaid Other | Admitting: Family Medicine

## 2020-12-02 ENCOUNTER — Other Ambulatory Visit: Payer: Self-pay

## 2020-12-02 VITALS — BP 136/86 | HR 79 | Wt 222.4 lb

## 2020-12-02 DIAGNOSIS — M797 Fibromyalgia: Secondary | ICD-10-CM

## 2020-12-02 DIAGNOSIS — I10 Essential (primary) hypertension: Secondary | ICD-10-CM

## 2020-12-02 DIAGNOSIS — E782 Mixed hyperlipidemia: Secondary | ICD-10-CM | POA: Diagnosis not present

## 2020-12-02 MED ORDER — GABAPENTIN 300 MG PO CAPS: 900.0000 mg | ORAL_CAPSULE | Freq: Three times a day (TID) | ORAL | 2 refills | Status: DC

## 2020-12-02 NOTE — Progress Notes (Signed)
    SUBJECTIVE:   CHIEF COMPLAINT / HPI:   HTN  Does not take blood pressures at home. No medication side effects.   HLD Strong family hx of high cholesterol. Taking atorvastatin 80 mg. No medication side effects.   Fibromyalgia  Patient reports that she has been taking 900 mg TID up from 600 mg after being increased at sports medicine for her neck pain. She is no longer seeing Dr. Garlan Fillers as she is s/p anterior cervical decompression 10/23/20. History of multiple mental health issues and follows at [facility]. She has therapist as well.   Reviewed medications today Current Outpatient Medications  Medication Instructions   amLODipine (NORVASC) 5 mg, Oral, Daily   atorvastatin (LIPITOR) 80 MG tablet TAKE 1 TABLET BY MOUTH DAILY.   busPIRone (BUSPAR) 15 mg, Oral, 3 times daily PRN   diclofenac Sodium (VOLTAREN) 2 g, Topical, 4 times daily   gabapentin (NEURONTIN) 900 mg, Oral, 3 times daily   hydrOXYzine (ATARAX/VISTARIL) 25 mg, Oral, 3 times daily PRN   Multiple Vitamin (MULTIVITAMIN WITH MINERALS) TABS tablet 1 tablet, Oral, Daily, Women's Multivitamin   oxyCODONE-acetaminophen (PERCOCET/ROXICET) 5-325 MG tablet 1-2 tablets, Oral, Every 4 hours PRN   sertraline (ZOLOFT) 100 mg, Oral, Every evening   topiramate (TOPAMAX) 25 mg, Oral, 2 times daily   traZODone (DESYREL) 100 mg, Oral, At bedtime PRN   PERTINENT  PMH / PSH: Depression, obesity, anxiety, HLD, fibromyalgia   OBJECTIVE:   BP 136/86   Pulse 79   Wt 222 lb 6.4 oz (100.9 kg)   LMP 01/08/2014   SpO2 96%   BMI 33.82 kg/m   General: NAD, non-toxic, well-appearing, sitting comfortably in chair   HEENT: [place]/AT. PERRLA. EOMI.  Cardiovascular: RRR, normal S1, S2. B/L 2+ RP. 1/6 systolic murmur at LUSB. No BLEE Respiratory: CTAB. No IWOB.  Abdomen: + BS. NT, ND, soft to palpation.  Extremities: Warm and well perfused. Moving spontaneously.  Integumentary: No obvious rashes, lesions, trauma on general exam. Neuro:  No  FND  ASSESSMENT/PLAN:   Fibromyalgia Patient has history of generalized pain and reports that she has had inconsistent work up and she still doesn't know what it wrong with her. Today, had discussion about [facility] being a residency clinic. Patient and I agreed that she may be better served at a facility without frequent turnover given her complicated past medical history. She would greatly benefit with provider who can follow her long term. She may be looking for another provider.  Can continue gabapentin 900 mg TID as patient reports she has continued this dose. Updated Rx.   HTN (hypertension) Well controlled. Most recent BMP wnl. Continue amlodpine 5 mg.   Mixed hyperlipidemia Strong family hx. Will recheck lipid panel. Will plan on adding zetia if lipid panel continues to be elevated.     Wilber Oliphant, MD [place]

## 2020-12-02 NOTE — Assessment & Plan Note (Signed)
Patient has history of generalized pain and reports that she has had inconsistent work up and she still doesn't know what it wrong with her. Today, had discussion about [facility] being a residency clinic. Patient and I agreed that she may be better served at a facility without frequent turnover given her complicated past medical history. She would greatly benefit with provider who can follow her long term. She may be looking for another provider.  Can continue gabapentin 900 mg TID as patient reports she has continued this dose. Updated Rx.

## 2020-12-02 NOTE — Assessment & Plan Note (Signed)
Strong family hx. Will recheck lipid panel. Will plan on adding zetia if lipid panel continues to be elevated.

## 2020-12-02 NOTE — Assessment & Plan Note (Addendum)
Well controlled. Most recent BMP wnl. Continue amlodpine 5 mg.

## 2020-12-03 ENCOUNTER — Telehealth: Payer: Self-pay | Admitting: Family Medicine

## 2020-12-03 LAB — LIPID PANEL
Chol/HDL Ratio: 7.2 ratio — ABNORMAL HIGH (ref 0.0–4.4)
Cholesterol, Total: 172 mg/dL (ref 100–199)
HDL: 24 mg/dL — ABNORMAL LOW (ref >39)
LDL Chol Calc (NIH): 78 mg/dL (ref 0–99)
Triglycerides: 433 mg/dL — ABNORMAL HIGH (ref 0–149)
VLDL Cholesterol Cal: 70 mg/dL — ABNORMAL HIGH (ref 5–40)

## 2020-12-03 LAB — HEMOGLOBIN A1C
Est. average glucose Bld gHb Est-mCnc: 114 mg/dL
Hgb A1c MFr Bld: 5.6 % (ref 4.8–5.6)

## 2020-12-03 MED ORDER — EZETIMIBE 10 MG PO TABS: 10.0000 mg | ORAL_TABLET | Freq: Every day | ORAL | 3 refills | Status: DC

## 2020-12-03 NOTE — Addendum Note (Signed)
Addended by: Zettie Cooley E on: 12/03/2020 01:48 PM   Modules accepted: Orders

## 2020-12-03 NOTE — Telephone Encounter (Signed)
Called patient to discuss results.  No answer and voicemail was left notifying patient to call to discuss results.    She is on atorvastatin 80 mg and continues to have elevated triglycerides, VLDL.  During the appointment, we discussed adding Zetia.  She has a strong family history of hyperlipidemia.  I have sent this to the pharmacy.  10 mg once daily.  We will plan to recheck a lipid panel in 3 months.   If patient calls back, please let her know.  Please message me if she has any further questions.

## 2020-12-03 NOTE — Telephone Encounter (Signed)
Patient returns call to nurse line. Informed of below.   Cassiopeia Florentino C Mackensey Bolte, RN  

## 2020-12-04 ENCOUNTER — Other Ambulatory Visit: Payer: Self-pay | Admitting: Family Medicine

## 2020-12-07 ENCOUNTER — Other Ambulatory Visit: Payer: Self-pay | Admitting: Family Medicine

## 2020-12-07 ENCOUNTER — Other Ambulatory Visit: Payer: Medicaid Other | Admitting: *Deleted

## 2020-12-07 ENCOUNTER — Other Ambulatory Visit: Payer: Self-pay

## 2020-12-07 NOTE — Patient Instructions (Signed)
Visit Information  Ms. Deats was given information about Medicaid Managed Care team care coordination services as a part of their [place] Medicaid benefit. [name] verbally consented to engagement with the [facility] Managed Care team.   For questions related to your [facility], please call: [phone] or visit the homepage here: [email or web address]  If you would like to schedule transportation through your [facility], please call the following number at least 2 days in advance of your appointment: [phone].   Call the [place] at [phone], at any time, 24 hours a day, 7 days a week. If you are in danger or need immediate medical attention call 911.  Ms. Schonberger - following are the goals we discussed in your visit today:   Goals Addressed             This Visit's Progress    Protect My Health       Timeframe:  Long-Range Goal Priority:  High Start Date:   12/01/20                          Expected End Date: 02/04/21                      Follow Up Date 01/04/21    - schedule recommended health tests (blood work, mammogram, colonoscopy, pap test) - schedule and keep appointment for annual check-up - schedule eye exam - increase intake of fruits and vegetables - work with [facility] Pharmacist, Ovid Curd for medication management  - work with [facility] LCSW, Jerene Pitch   Why is this important?   Screening tests can find diseases early when they are easier to treat.  Your doctor or nurse will talk with you about which tests are important for you.  Getting shots for common diseases like the flu and shingles will help prevent them.              Please see education materials related to anxiety provided as print materials.   The patient verbalized understanding of instructions provided today and agreed to receive a mailed copy of patient  instruction and/or educational materials.  Telephone follow up appointment with Managed Medicaid care management team member scheduled for:01/04/21 @ 10:30am  Lurena Joiner RN, BSN [place] RN Care Coordinator   Following is a copy of your plan of care:  Patient Care Plan: General Plan of Care (Adult)     Problem Identified: Health Promotion or Disease Self-Management (General Plan of Care)      Long-Range Goal: Self-Management Plan Developed   Start Date: 12/01/2020  Expected End Date: 02/04/2021  Recent Progress: On track  Priority: High  Note:   Current Barriers:  Ineffective Self Health Maintenance-Ms. Loveless is recovering from spinal surgery. She is managing her pain and activity. She understands she has to limit and space out her activity. She has a 66 year old son at home and a sister that is very helpful. She is able to manage doing her chores around the home and errands like grocery shopping. She has been making adjustments to her diet and taking Lipitor to manage hyperlipidemia over the last year. She was unable to attend her follow up visit with the PCP due to having her spinal surgery.Update- Patient called [facility] upset about her follow up with PCP. She has concerns regarding her care and would like to  switch PCP's. Patient struggles with anxiety and PTSD, as well as multiple health issues prior to having back surgery. She needs an office close to home. Unable to independently manage hyperlipidemia Currently UNABLE TO independently self manage needs related to chronic health conditions.  Knowledge Deficits related to short term plan for care coordination needs and long term plans for chronic disease management needs Nurse Case Manager Clinical Goal(s):  patient will work with care management team to address care coordination and chronic disease management needs related to Disease Management   Interventions:  Evaluation of current treatment plan related  to hyperlipidemia and patient's adherence to plan as established by provider. Provided patient with number to [facility] at [facility] [phone] Referral to [place] LCSW for dealing with anxiety and PTSD Discussed plans with patient for ongoing care management follow up and provided patient with direct contact information for care management team Provided therapeutic listening Provided number to [facility] [phone] for patient to call with questions regarding changing provider Self Care Activities:  Patient will self administer medications as prescribed Patient will attend all scheduled provider appointments Patient will call pharmacy for medication refills Patient will call provider office for new concerns or questions Patient will call and schedule PCP appointment and eye exam Patient Goals: - call to cancel if needed - keep a calendar with prescription refill dates - keep a calendar with appointment dates - schedule recommended health tests (blood work, mammogram, colonoscopy, pap test) - schedule and keep appointment for annual check-up - schedule eye exam - increase intake of fruits and vegetables - work with [facility] Pharmacist, Ovid Curd for medication management  - work with MM LCSW, [place]  Follow Up Plan: Telephone follow up appointment with care management team member scheduled for: 01/04/21 @ 10:30am

## 2020-12-07 NOTE — Patient Outreach (Signed)
Medicaid Managed Care   Nurse Care Manager Note  12/07/2020 Name:  [name] MRN:  [id number] DOB:  1975-09-21  [name] is an 45 y.o. year old female who is a primary patient of [name], Marcia Quale, MD.  The [facility] Managed Care Coordination team was consulted for assistance with:    Anxiety Recent spinal surgery  [name] was given information about Medicaid Managed Care Coordination team services today. [name] agreed to services and verbal consent obtained.  Engaged with patient by telephone for follow up visit in response to provider referral for case management and/or care coordination services.   Assessments/Interventions:  Review of past medical history, allergies, medications, health status, including review of consultants reports, laboratory and other test data, was performed as part of comprehensive evaluation and provision of chronic care management services.  SDOH (Social Determinants of Health) assessments and interventions performed:   Care Plan  Allergies  Allergen Reactions   Latex Itching    Positive Skin Testing at allergist.   Hydrocodone-Acetaminophen Itching and Nausea And Vomiting    And dizziness    Medications Reviewed Today     Reviewed by Marcia Oliphant, MD (Resident) on 12/02/20 at 1611  Med List Status: <None>   Medication Order Taking? Sig Documenting Provider Last Dose Status Informant  amLODipine (NORVASC) 5 MG tablet [id number]  Take 1 tablet (5 mg total) by mouth daily.  Patient taking differently: Take 5 mg by mouth in the morning.   Marcia Oliphant, MD  Active   atorvastatin (LIPITOR) 80 MG tablet [id number]  TAKE 1 TABLET BY MOUTH DAILY.  Patient taking differently: Take 80 mg by mouth every evening.   Marcia Oliphant, MD  Active   busPIRone (BUSPAR) 15 MG tablet [id number]  Take 15 mg by mouth 3 (three) times daily as needed. [provider]  Active   diclofenac Sodium (VOLTAREN) 1 % GEL [id number]  Apply 2 g topically 4 (four) times  daily.  Patient taking differently: Apply 2 g topically 4 (four) times daily as needed (pain).   Marcia Floro, DO  Active   gabapentin (NEURONTIN) 300 MG capsule [id number]  Take 3 capsules (900 mg total) by mouth 3 (three) times daily. Marcia Oliphant, MD  Active   hydrOXYzine (ATARAX/VISTARIL) 25 MG tablet [id number]  Take 25 mg by mouth 3 (three) times daily as needed for anxiety. [provider]  Active Self           Med Note (Webb Weed A   Tue Dec 01, 2020  9:35 AM) Taking for hives related to anxiety  Multiple Vitamin (MULTIVITAMIN WITH MINERALS) TABS tablet [id number]  Take 1 tablet by mouth daily at 2 PM. Women's Multivitamin [provider]  Active Self  oxyCODONE-acetaminophen (PERCOCET/ROXICET) 5-325 MG tablet [id number]  Take 1-2 tablets by mouth every 4 (four) hours as needed for moderate pain or severe pain. Marcia Pies, MD  Active   sertraline (ZOLOFT) 100 MG tablet [id number]  Take 100 mg by mouth every evening. [provider]  Active Self  topiramate (TOPAMAX) 25 MG tablet [id number] Yes Take 25 mg by mouth in the morning and at bedtime. [provider]  Active Self           Med Note ([name] A   Tue Dec 01, 2020  9:29 AM) Taking one tablet every evening only  traZODone (DESYREL) 100 MG tablet [id number] Yes Take 100 mg by mouth at bedtime as  needed for sleep. [provider]  Active Self            Patient Active Problem List   Diagnosis Date Noted   Fibromyalgia 12/02/2020   Cervical spondylosis with myelopathy and radiculopathy 10/23/2020   Left shoulder pain 08/13/2020   Muscle spasticity 08/11/2020   BMI 34.0-34.9,adult 10/23/2019   Anxiety 10/08/2019   Healthcare maintenance 10/08/2019   Proteinuria 02/21/2018   Arthralgia of multiple joints 02/21/2018   Major depression, recurrent ([place]) 10/31/2017   HTN (hypertension) 10/31/2017   Incontinence in female 10/31/2017   Mixed hyperlipidemia 10/31/2017    PTSD (post-traumatic stress disorder) 10/30/2017    Conditions to be addressed/monitored per PCP order:  Anxiety and health maintenance  Care Plan : General Plan of Care (Adult)  Updates made by Melissa Montane, RN since 12/07/2020 12:00 AM     Problem: Health Promotion or Disease Self-Management (General Plan of Care)      Long-Range Goal: Self-Management Plan Developed   Start Date: 12/01/2020  Expected End Date: 02/04/2021  Recent Progress: On track  Priority: High  Note:   Current Barriers:  Ineffective Self Health Maintenance-[name] is recovering from spinal surgery. She is managing her pain and activity. She understands she has to limit and space out her activity. She has a 24 year old son at home and a sister that is very helpful. She is able to manage doing her chores around the home and errands like grocery shopping. She has been making adjustments to her diet and taking Lipitor to manage hyperlipidemia over the last year. She was unable to attend her follow up visit with the PCP due to having her spinal surgery.Update- Patient called [facility] upset about her follow up with PCP. She has concerns regarding her care and would like to switch PCP's. Patient struggles with anxiety and PTSD, as well as multiple health issues prior to having back surgery. She needs an office close to home. Unable to independently manage hyperlipidemia Currently UNABLE TO independently self manage needs related to chronic health conditions.  Knowledge Deficits related to short term plan for care coordination needs and long term plans for chronic disease management needs Nurse Case Manager Clinical Goal(s):  patient will work with care management team to address care coordination and chronic disease management needs related to Disease Management   Interventions:  Evaluation of current treatment plan related to hyperlipidemia and patient's adherence to plan as established by provider. Provided patient with  number to [facility] Primary Care at [facility] [phone] Referral to [place] LCSW for dealing with anxiety and PTSD Discussed plans with patient for ongoing care management follow up and provided patient with direct contact information for care management team Provided therapeutic listening Provided number to [facility] [phone] for patient to call with questions regarding changing provider Self Care Activities:  Patient will self administer medications as prescribed Patient will attend all scheduled provider appointments Patient will call pharmacy for medication refills Patient will call provider office for new concerns or questions Patient will call and schedule PCP appointment and eye exam Patient Goals: - call to cancel if needed - keep a calendar with prescription refill dates - keep a calendar with appointment dates - schedule recommended health tests (blood work, mammogram, colonoscopy, pap test) - schedule and keep appointment for annual check-up - schedule eye exam - increase intake of fruits and vegetables - work with [facility] Pharmacist, Ovid Curd for medication management  - work with [facility] LCSW, [place]  Follow Up  Plan: Telephone follow up appointment with care management team member scheduled for: 01/04/21 @ 10:30am     Follow Up:  Patient agrees to Care Plan and Follow-up.  Plan: The Managed Medicaid care management team will reach out to the patient again over the next 30 days.  Date/time of next scheduled RN care management/care coordination outreach:  01/04/21 @ 10:30am  Lurena Joiner RN, [place] RN Care Coordinator

## 2020-12-09 ENCOUNTER — Other Ambulatory Visit: Payer: Self-pay
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
## 2020-12-09 NOTE — Patient Instructions (Signed)
Visit Information  Ms. Papania was given information about Medicaid Managed Care team care coordination services as a part of their [place] Medicaid benefit. [name] verbally consented to engagement with the [facility] Managed Care team.   For questions related to your [facility], please call: [phone] or visit the homepage here: [email or web address]  If you would like to schedule transportation through your [facility], please call the following number at least 2 days in advance of your appointment: [phone].   Call the Sisters at [phone], at any time, 24 hours a day, 7 days a week. If you are in danger or need immediate medical attention call 911.  Ms. Sandford - following are the goals we discussed in your visit today:   Goals Addressed   None     Please see education materials related to HTN provided as print materials.   Patient verbalizes understanding of instructions provided today.   The patient has been provided with contact information for the Managed Medicaid care management team and has been advised to call with any health related questions or concerns.   [place] Constable, Pharm.D., Managed Medicaid Pharmacist [phone]   Following is a copy of your plan of care:  Patient Care Plan: General Plan of Care (Adult)     Problem Identified: Health Promotion or Disease Self-Management (General Plan of Care)      Long-Range Goal: Self-Management Plan Developed   Start Date: 12/01/2020  Expected End Date: 02/04/2021  Recent Progress: On track  Priority: High  Note:   Current Barriers:  Ineffective Self Health Maintenance-Ms. Erker is recovering from spinal surgery. She is managing her pain and activity. She understands she has to limit and space out her activity. She has a 25 year old son at home and a sister that is  very helpful. She is able to manage doing her chores around the home and errands like grocery shopping. She has been making adjustments to her diet and taking Lipitor to manage hyperlipidemia over the last year. She was unable to attend her follow up visit with the PCP due to having her spinal surgery.Update- Patient called [facility] upset about her follow up with PCP. She has concerns regarding her care and would like to switch PCP's. Patient struggles with anxiety and PTSD, as well as multiple health issues prior to having back surgery. She needs an office close to home. Unable to independently manage hyperlipidemia Currently UNABLE TO independently self manage needs related to chronic health conditions.  Knowledge Deficits related to short term plan for care coordination needs and long term plans for chronic disease management needs Nurse Case Manager Clinical Goal(s):  patient will work with care management team to address care coordination and chronic disease management needs related to Disease Management   Interventions:  Evaluation of current treatment plan related to hyperlipidemia and patient's adherence to plan as established by provider. Provided patient with number to [facility] Primary Care at [facility] [phone] Referral to [place] LCSW for dealing with anxiety and PTSD Discussed plans with patient for ongoing care management follow up and provided patient with direct contact information for care management team Provided therapeutic listening Provided number to [facility] [phone] for patient to call with questions regarding changing provider Self Care Activities:  Patient will self administer medications as prescribed Patient will attend all scheduled provider appointments Patient will call pharmacy for medication refills Patient will call provider office for  new concerns or questions Patient will call and schedule PCP appointment and eye exam Patient Goals: - call to cancel if  needed - keep a calendar with prescription refill dates - keep a calendar with appointment dates - schedule recommended health tests (blood work, mammogram, colonoscopy, pap test) - schedule and keep appointment for annual check-up - schedule eye exam - increase intake of fruits and vegetables - work with MM Pharmacist, Ovid Curd for medication management  - work with MM LCSW, [place]  Follow Up Plan: Telephone follow up appointment with care management team member scheduled for: 01/04/21 @ 10:30am    Patient Care Plan: Medication Coordination     Problem Identified: Health Promotion or Disease Self-Management (General Plan of Care)      Goal: Medication Coordination   Note:   Current Barriers:  Does not maintain contact with provider office   Pharmacist Clinical Goal(s):  Over the next 90 days, patient will contact provider office for questions/concerns as evidenced notation of same in electronic health record through collaboration with PharmD and provider.    Interventions: Inter-disciplinary care team collaboration (see longitudinal plan of care) Comprehensive medication review performed; medication list updated in electronic medical record      Patient Goals/Self-Care Activities Over the next 90 days, patient will:  - collaborate with provider on medication access solutions  Follow Up Plan: The patient has been provided with contact information for the care management team and has been advised to call with any health related questions or concerns.      Task: Mutually Develop and Royce Macadamia Achievement of Patient Goals   Note:   Care Management Activities:    - verbalization of feelings encouraged    Notes:

## 2020-12-14 DIAGNOSIS — F411 Generalized anxiety disorder: Secondary | ICD-10-CM | POA: Diagnosis not present

## 2020-12-14 DIAGNOSIS — F319 Bipolar disorder, unspecified: Secondary | ICD-10-CM | POA: Diagnosis not present

## 2020-12-14 DIAGNOSIS — F4310 Post-traumatic stress disorder, unspecified: Secondary | ICD-10-CM | POA: Diagnosis not present

## 2020-12-15 ENCOUNTER — Other Ambulatory Visit: Payer: Self-pay | Admitting: Licensed Clinical Social Worker

## 2020-12-15 NOTE — Patient Instructions (Signed)
Visit Information  [name] was given information about Medicaid Managed Care team care coordination services as a part of their [place] Medicaid benefit. [name] verbally consented to engagement with the [facility] Managed Care team.   For questions related to your [facility], please call: [phone] or visit the homepage here: [email or web address]  If you would like to schedule transportation through your [facility], please call the following number at least 2 days in advance of your appointment: [phone].   Call the [place] at [phone], at any time, 24 hours a day, 7 days a week. If you are in danger or need immediate medical attention call 911.   [name], BSW, MSW, [employer] Managed Medicaid LCSW [email or web address] Phone: [phone]

## 2020-12-15 NOTE — Patient Outreach (Addendum)
Medicaid Managed Care Social Work Note  12/15/2020 Name:  [name] MRN:  [id number] DOB:  [birth year]  [name] is an 45 y.o. year old female who is a primary patient of [name], [name], MD.  The Medicaid Managed Care Coordination team was consulted for assistance with:  [place] and Resources  Ms. Miles was given information about Medicaid Managed CareCoordination services today. [name] agreed to services and verbal consent obtained.  Engaged with patient  for by telephone forinitial visit in response to referral for case management and/or care coordination services.   Assessments/Interventions:  Review of past medical history, allergies, medications, health status, including review of consultants reports, laboratory and other test data, was performed as part of comprehensive evaluation and provision of chronic care management services.  Patient reports that she is unable to complete her appointment with [facility] LCSW today as she is going to her mother's and would like to reschedule appointment for 12/18/20 if possible. Appointment has been rescheduled.   Care Plan                 Allergies  Allergen Reactions   Latex Itching    Positive Skin Testing at allergist.   Hydrocodone-Acetaminophen Itching and Nausea And Vomiting    And dizziness    Medications Reviewed Today     Reviewed by Wilber Oliphant, MD (Resident) on 12/02/20 at 1611  Med List Status: <None>   Medication Order Taking? Sig Documenting Provider Last Dose Status Informant  amLODipine (NORVASC) 5 MG tablet [id number]  Take 1 tablet (5 mg total) by mouth daily.  Patient taking differently: Take 5 mg by mouth in the morning.   Wilber Oliphant, MD  Active   atorvastatin (LIPITOR) 80 MG tablet [id number]  TAKE 1 TABLET BY MOUTH DAILY.  Patient taking differently: Take 80 mg by mouth every evening.   Wilber Oliphant, MD  Active   busPIRone (BUSPAR) 15 MG tablet [id number]  Take 15 mg by mouth 3 (three) times  daily as needed. [provider]  Active   diclofenac Sodium (VOLTAREN) 1 % GEL [id number]  Apply 2 g topically 4 (four) times daily.  Patient taking differently: Apply 2 g topically 4 (four) times daily as needed (pain).   Marcia Floro, DO  Active   gabapentin (NEURONTIN) 300 MG capsule [id number]  Take 3 capsules (900 mg total) by mouth 3 (three) times daily. Wilber Oliphant, MD  Active   hydrOXYzine (ATARAX/VISTARIL) 25 MG tablet [id number]  Take 25 mg by mouth 3 (three) times daily as needed for anxiety. [provider]  Active Self           Med Note (Page, Marcia A   Tue Dec 01, 2020  9:35 AM) Taking for hives related to anxiety  Multiple Vitamin (MULTIVITAMIN WITH MINERALS) TABS tablet [id number]  Take 1 tablet by mouth daily at 2 PM. Women's Multivitamin [provider]  Active Self  oxyCODONE-acetaminophen (PERCOCET/ROXICET) 5-325 MG tablet [id number]  Take 1-2 tablets by mouth every 4 (four) hours as needed for moderate pain or severe pain. Newman Pies, MD  Active   sertraline (ZOLOFT) 100 MG tablet [id number]  Take 100 mg by mouth every evening. [provider]  Active Self  topiramate (TOPAMAX) 25 MG tablet [id number] Yes Take 25 mg by mouth in the morning and at bedtime. [provider]  Active Self           Med Note (  Page, Marcia A   Tue Dec 01, 2020  9:29 AM) Taking one tablet every evening only  traZODone (DESYREL) 100 MG tablet [id number] Yes Take 100 mg by mouth at bedtime as needed for sleep. [provider]  Active Self            Patient Active Problem List   Diagnosis Date Noted   Fibromyalgia 12/02/2020   Cervical spondylosis with myelopathy and radiculopathy 10/23/2020   Left shoulder pain 08/13/2020   Muscle spasticity 08/11/2020   BMI 34.0-34.9,adult 10/23/2019   Anxiety 10/08/2019   Healthcare maintenance 10/08/2019   Proteinuria 02/21/2018   Arthralgia of multiple joints 02/21/2018   Major  depression, recurrent ([place]) 10/31/2017   HTN (hypertension) 10/31/2017   Incontinence in female 10/31/2017   Mixed hyperlipidemia 10/31/2017   PTSD (post-traumatic stress disorder) 10/30/2017    Conditions to be addressed/monitored per PCP order:  Anxiety and Depression  There are no care plans that you recently modified to display for this patient.   Follow up:  Patient agrees to Care Plan and Follow-up.  Plan: The Managed Medicaid care management team will reach out to the patient again over the next 3 days.  Date of next scheduled Social Work care management/care coordination outreach:  12/18/20  [name], BSW, MSW, LCSW Managed Medicaid LCSW [email or web address] Phone: [phone]

## 2020-12-18 ENCOUNTER — Telehealth: Payer: Self-pay | Admitting: Licensed Clinical Social Worker

## 2020-12-18 ENCOUNTER — Ambulatory Visit: Payer: Self-pay

## 2020-12-18 NOTE — Patient Outreach (Signed)
  [facility]) Care Management  [facility] Social Work  12/18/2020  DOSHA BROSHEARS 10/11/1975 [id number]  Encounter Medications:  Outpatient Encounter Medications as of 12/18/2020  Medication Sig Note   amLODipine (NORVASC) 5 MG tablet TAKE 1 TABLET (5 MG TOTAL) BY MOUTH DAILY.    atorvastatin (LIPITOR) 80 MG tablet TAKE 1 TABLET BY MOUTH DAILY. (Patient taking differently: Take 80 mg by mouth every evening.)    busPIRone (BUSPAR) 15 MG tablet Take 15 mg by mouth 3 (three) times daily as needed.    diclofenac Sodium (VOLTAREN) 1 % GEL Apply 2 g topically 4 (four) times daily. (Patient taking differently: Apply 2 g topically 4 (four) times daily as needed (pain).)    ezetimibe (ZETIA) 10 MG tablet Take 1 tablet (10 mg total) by mouth daily.    gabapentin (NEURONTIN) 600 MG tablet Take 1.5 tablets (900 mg total) by mouth 3 (three) times daily.    HYDROcodone-acetaminophen (NORCO/VICODIN) 5-325 MG tablet Take by mouth.    hydrOXYzine (ATARAX/VISTARIL) 25 MG tablet Take 25 mg by mouth 3 (three) times daily as needed for anxiety. 12/01/2020: Taking for hives related to anxiety   Lurasidone HCl (LATUDA) 60 MG TABS     Multiple Vitamin (MULTIVITAMIN WITH MINERALS) TABS tablet Take 1 tablet by mouth daily at 2 PM. Women's Multivitamin    oxyCODONE-acetaminophen (PERCOCET/ROXICET) 5-325 MG tablet Take 1-2 tablets by mouth every 4 (four) hours as needed for moderate pain or severe pain.    sertraline (ZOLOFT) 100 MG tablet Take 100 mg by mouth every evening.    topiramate (TOPAMAX) 25 MG tablet Take 25 mg by mouth in the morning and at bedtime. 12/01/2020: Taking one tablet every evening only   traZODone (DESYREL) 100 MG tablet Take 100 mg by mouth at bedtime as needed for sleep.    No facility-administered encounter medications on file as of 12/18/2020.    Functional Status:  In your present state of health, do you have any difficulty performing the following activities: 10/23/2020 10/21/2020   Hearing? N N  Vision? N N  Difficulty concentrating or making decisions? Tempie Donning  Walking or climbing stairs? N N  Dressing or bathing? N N  Doing errands, shopping? - N  Some recent data might be hidden    Fall/Depression Screening:  PHQ 2/9 Scores 12/02/2020 08/07/2020 02/07/2020 10/22/2019 10/22/2019 10/07/2019 01/16/2019  PHQ - 2 Score 2 6 4 2 2  0 1  PHQ- 9 Score 17 15 19 14  - - -    LCSW completed [facility] outreach attempt today but was unable to reach patient successfully. A HIPPA compliant voice message was left encouraging patient to return call once available. LCSW will reschedule patient's [facility] Social Work appointment if no return call has been made.  Plan:  Follow-up:  Follow-up in 2 week(s)  [name], BSW, MSW, [employer] Managed Medicaid LCSW [email or web address] Phone: [phone]

## 2020-12-18 NOTE — Patient Instructions (Signed)
[name] ,   The [facility] Managed Care Team is available to provide assistance to you with your healthcare needs at no cost and as a benefit of your [facility] Health plan. Please reach out to me at the number below. I am available to be of assistance to you regarding your healthcare needs. .   Thank you,   [name], BSW, MSW, LCSW Managed Medicaid LCSW [email or web address] Phone: [phone]

## 2020-12-24 ENCOUNTER — Other Ambulatory Visit: Payer: Self-pay | Admitting: Licensed Clinical Social Worker

## 2020-12-24 NOTE — Patient Outreach (Addendum)
Medicaid Managed Care Social Work Note  12/24/2020 Name:  [name] MRN:  [id number] DOB:  1975-08-04  [name] is an 45 y.o. year old female who is a primary patient of Lattie Haw, MD.  The Medicaid Managed Care Coordination team was consulted for assistance with:  [place] and Resources  Ms. Sobocinski was given information about Medicaid Managed CareCoordination services today. [name] agreed to services and verbal consent obtained.  Engaged with patient  for by telephone forinitial visit in response to referral for case management and/or care coordination services.   Assessments/Interventions:  Review of past medical history, allergies, medications, health status, including review of consultants reports, laboratory and other test data, was performed as part of comprehensive evaluation and provision of chronic care management services.  SDOH: (Social Determinant of Health) assessments and interventions performed: SDOH Interventions    Flowsheet Row Most Recent Value  SDOH Interventions   SDOH Interventions for the Following Domains Depression  Depression Interventions/Treatment  Currently on Treatment, Counseling, Medication       Advanced Directives Status:  See Care Plan for related entries.  Care Plan                 Allergies  Allergen Reactions   Latex Itching    Positive Skin Testing at allergist.   Hydrocodone-Acetaminophen Itching and Nausea And Vomiting    And dizziness    Medications Reviewed Today     Reviewed by Wilber Oliphant, MD (Resident) on 12/02/20 at 1611  Med List Status: <None>   Medication Order Taking? Sig Documenting Provider Last Dose Status Informant  amLODipine (NORVASC) 5 MG tablet [id number]  Take 1 tablet (5 mg total) by mouth daily.  Patient taking differently: Take 5 mg by mouth in the morning.   Wilber Oliphant, MD  Active   atorvastatin (LIPITOR) 80 MG tablet [id number]  TAKE 1 TABLET BY MOUTH DAILY.  Patient taking  differently: Take 80 mg by mouth every evening.   Wilber Oliphant, MD  Active   busPIRone (BUSPAR) 15 MG tablet [id number]  Take 15 mg by mouth 3 (three) times daily as needed. [provider]  Active   diclofenac Sodium (VOLTAREN) 1 % GEL [id number]  Apply 2 g topically 4 (four) times daily.  Patient taking differently: Apply 2 g topically 4 (four) times daily as needed (pain).   [name], DO  Active   gabapentin (NEURONTIN) 300 MG capsule [id number]  Take 3 capsules (900 mg total) by mouth 3 (three) times daily. Wilber Oliphant, MD  Active   hydrOXYzine (ATARAX/VISTARIL) 25 MG tablet [id number]  Take 25 mg by mouth 3 (three) times daily as needed for anxiety. [provider]  Active Self           Med Note (ROBB, MELANIE A   Tue Dec 01, 2020  9:35 AM) Taking for hives related to anxiety  Multiple Vitamin (MULTIVITAMIN WITH MINERALS) TABS tablet [id number]  Take 1 tablet by mouth daily at 2 PM. Women's Multivitamin [provider]  Active Self  oxyCODONE-acetaminophen (PERCOCET/ROXICET) 5-325 MG tablet [id number]  Take 1-2 tablets by mouth every 4 (four) hours as needed for moderate pain or severe pain. Newman Pies, MD  Active   sertraline (ZOLOFT) 100 MG tablet [id number]  Take 100 mg by mouth every evening. [provider]  Active Self  topiramate (TOPAMAX) 25 MG tablet [id number] Yes Take 25 mg by mouth in the morning and at  bedtime. [provider]  Active Self           Med Note (ROBB, MELANIE A   Tue Dec 01, 2020  9:29 AM) Taking one tablet every evening only  traZODone (DESYREL) 100 MG tablet [id number] Yes Take 100 mg by mouth at bedtime as needed for sleep. [provider]  Active Self            Patient Active Problem List   Diagnosis Date Noted   Fibromyalgia 12/02/2020   Cervical spondylosis with myelopathy and radiculopathy 10/23/2020   Left shoulder pain 08/13/2020   Muscle spasticity 08/11/2020   BMI 34.0-34.9,adult  10/23/2019   Anxiety 10/08/2019   Healthcare maintenance 10/08/2019   Proteinuria 02/21/2018   Arthralgia of multiple joints 02/21/2018   Major depression, recurrent ([place]) 10/31/2017   HTN (hypertension) 10/31/2017   Incontinence in female 10/31/2017   Mixed hyperlipidemia 10/31/2017   PTSD (post-traumatic stress disorder) 10/30/2017    Conditions to be addressed/monitored per PCP order:  Anxiety, Depression, and Bipolar Disorder  Care Plan : LCSW Plan of Care  Updates made by Greg Cutter, LCSW since 12/24/2020 12:00 AM     Problem: Anxiety Identification (Anxiety)      Long-Range Goal: Anxiety Symptoms Identified   Start Date: 12/24/2020  Priority: High  Note:   Timeframe:  Long-Range Goal Priority:  High Start Date:   12/24/20                          Expected End Date: ongoing                      Follow Up Date 01/25/21  Current barriers:   Chronic Mental Health needs related to anxiety, bipolar, and depression Limited social support, ADL IADL limitations, and Mental Health Concerns  Needs Support, Education, and Care Coordination in order to meet unmet mental health needs. Clinical Goal(s): patient will work with [facility] LCSW to improve her overall mental health needs. Patient as PTSD, bipolar, depression and anxiety.  Patient will implement healthy coping skills into her routine over the next 90 days. Clinical Interventions:  Assessed patient's previous and current treatment, coping skills, support system and barriers to care  Review various resources, discussed options and provided patient information about Department of Social Services (food stamps, [place], and utilities assistance) Depression screen reviewed , Solution-Focused Strategies, Mindfulness or Psychologist, educational, Active listening / Reflection utilized , Emotional Supportive Provided, Behavioral Activation, [place] , Brief CBT , Suicidal Ideation/Homicidal Ideation assessed:, and PHQ2/ PHQ9  completed ; Patient interviewed and appropriate assessments performed Discussed plans with patient for ongoing care management follow up and provided patient with direct contact information for care management team Assisted patient/caregiver with obtaining information about health plan benefits Provided education and assistance to client regarding Advanced Directives. Discussed several options for long term counseling based on need and insurance.  Assisted patient with narrowing the options down to [facility] ) Patient wishes to consider this referral over the next 30 days. [facility] LCSW will follow up regarding this concern on 01/25/21.  Patient will continue therapy and medication management services at [facility]. However, patient is getting upset that she continues to get new providers because the turn over rate for employees there is high.  Inter-disciplinary care team collaboration (see longitudinal plan of care) Patient Goals/Self-Care Activities: Over the next 120 days - barriers to treatment adherence identified - complementary  therapy use encouraged - counseling provided - depression screen reviewed - emotional liability acknowledged and normalized - full diagnostic interview performed - immediate suicide evaluation arranged - medication side effects managed - participation in mental health treatment encouraged - response to pharmacologic therapy monitored - self-awareness of emotional triggers encouraged - avoid negative self-talk - develop a personal safety plan - develop a plan to deal with triggers like holidays, anniversaries - exercise at least 2 to 3 times per week - have a plan for how to handle bad days - journal feelings and what helps to feel better or worse - spend time or talk with others at least 2 to 3 times per week - spend time or talk with others every day - watch for early signs of feeling worse - begin personal counseling - call and  visit an old friend - check out volunteer opportunities - join a support group - laugh; watch a funny movie or comedian - learn and use visualization or guided imagery - perform a random act of kindness - practice relaxation or meditation daily - start or continue a personal journal - talk about feelings with a friend, family or spiritual advisor - practice positive thinking and self-talk Call your insurance provider for more information about your Enhanced Benefits  Continue with therapy Continue with compliance of taking medication       Depression screen [facility] 2/9 12/24/2020 12/02/2020 08/07/2020 02/07/2020 10/22/2019  Decreased Interest 1 1 3 2 1   Down, Depressed, Hopeless 1 1 3 2 1   PHQ - 2 Score 2 2 6 4 2   Altered sleeping 3 3 3 3 2   Tired, decreased energy 3 3 3 3 2   Change in appetite 3 3 3 3 2   Feeling bad or failure about yourself  2 3 0 0 2  Trouble concentrating 1 3 0 3 2  Moving slowly or fidgety/restless 0 0 0 3 2  Suicidal thoughts 0 0 0 0 0  PHQ-9 Score 14 17 15 19 14   Difficult doing work/chores Very difficult Extremely dIfficult Extremely dIfficult - -    Follow up:  Patient agrees to Care Plan and Follow-up.  Plan: The Managed Medicaid care management team will reach out to the patient again next month. and The patient has been provided with contact information for the Managed Medicaid care management team and has been advised to call with any health related questions or concerns.  Date/time of next scheduled Social Work care management/care coordination outreach:  01/25/21 at 11:00 am  [name], BSW, MSW, [place] Medicaid LCSW [email or web address] Phone: [phone]

## 2020-12-24 NOTE — Patient Instructions (Signed)
Visit Information  [name] was given information about Medicaid Managed Care team care coordination services as a part of their [place] Medicaid benefit. [name] verbally consented to engagement with the [facility] Managed Care team.   For questions related to your [facility], please call: [phone] or visit the homepage here: [email or web address]  If you would like to schedule transportation through your [facility], please call the following number at least 2 days in advance of your appointment: [phone].   Call the [place] at [phone], at any time, 24 hours a day, 7 days a week. If you are in danger or need immediate medical attention call 911.  If you would like help to quit smoking, call 1-800-QUIT-NOW (321)206-8747) OR Espaol: 1-855-Djelo-Ya (4-174-081-4481) o para ms informacin haga clic aqu or Text READY to 200-400 to register via text  [name] - following are the goals we discussed in your visit today:   Goals Addressed             This Visit's Progress    Track and Manage My Symptoms-Depression       Timeframe:  Long-Range Goal Priority:  High Start Date:   12/24/20                          Expected End Date: ongoing                      Follow Up Date 01/25/21   - avoid negative self-talk - develop a personal safety plan - develop a plan to deal with triggers like holidays, anniversaries - exercise at least 2 to 3 times per week - have a plan for how to handle bad days - journal feelings and what helps to feel better or worse - spend time or talk with others at least 2 to 3 times per week - spend time or talk with others every day - watch for early signs of feeling worse - write in journal every day    Why is this important?   Keeping track of your progress will help your treatment team find the  right mix of medicine and therapy for you.  Write in your journal every day.  Day-to-day changes in depression symptoms are normal. It may be more helpful to check your progress at the end of each week instead of every day.     Notes:         [name], BSW, MSW, [employer] Managed Medicaid LCSW [email or web address] Phone: [phone]

## 2020-12-29 DIAGNOSIS — M4802 Spinal stenosis, cervical region: Secondary | ICD-10-CM | POA: Diagnosis not present

## 2021-01-01 ENCOUNTER — Ambulatory Visit: Payer: Medicaid Other

## 2021-01-04 ENCOUNTER — Other Ambulatory Visit: Payer: Self-pay | Admitting: *Deleted

## 2021-01-04 NOTE — Patient Instructions (Signed)
Visit Information  Ms. Ellana Flo Shanks  - as a part of your Medicaid benefit, you are eligible for care management and care coordination services at no cost or copay. I was unable to reach you by phone today but would be happy to help you with your health related needs. Please feel free to call me @ [phone].   A member of the Managed Medicaid care management team will reach out to you again over the next 3 weeks on 01/28/21 @ 2:30pm.   Lurena Joiner RN, [place] RN Care Coordinator

## 2021-01-04 NOTE — Patient Outreach (Signed)
Care Coordination  01/04/2021  ASHIRA KIRSTEN 09-Jun-1976 [id number]   Medicaid Managed Care   Unsuccessful Outreach Note  01/04/2021 Name: [name] MRN: [id number] DOB: [birth year]  Referred by: Lattie Haw, MD Reason for referral : High Risk Managed Medicaid (Unsuccessful RNCM follow up outreach)   An unsuccessful telephone outreach was attempted today. The patient was referred to the case management team for assistance with care management and care coordination.   Follow Up Plan: A HIPAA compliant phone message was left for the patient providing contact information and requesting a return call.   Lurena Joiner RN, BSN [place]  [employer] manager

## 2021-01-06 ENCOUNTER — Other Ambulatory Visit: Payer: Self-pay

## 2021-01-06 NOTE — Patient Outreach (Signed)
Patient no showed appt

## 2021-01-25 ENCOUNTER — Other Ambulatory Visit: Payer: Self-pay | Admitting: Licensed Clinical Social Worker

## 2021-01-25 ENCOUNTER — Ambulatory Visit: Payer: Medicaid Other

## 2021-01-25 ENCOUNTER — Encounter: Payer: Self-pay | Admitting: Family Medicine

## 2021-01-25 NOTE — Patient Instructions (Signed)
Visit Information  Ms. Heavrin was given information about Medicaid Managed Care team care coordination services as a part of their [place] Medicaid benefit. [name] verbally consented to engagement with the [facility] Managed Care team.   If you are experiencing a medical emergency, please call 911 or report to your local emergency department or urgent care.   If you have a non-emergency medical problem during routine business hours, please contact your provider's office and ask to speak with a nurse.   For questions related to your [facility], please call: [phone] or visit the homepage here: [email or web address]  If you would like to schedule transportation through your [facility], please call the following number at least 2 days in advance of your appointment: [phone].   Call the [facility] at [phone], at any time, 24 hours a day, 7 days a week. If you are in danger or need immediate medical attention call 911.  If you would like help to quit smoking, call 1-800-QUIT-NOW (408)184-2057) OR Espaol: 1-855-Djelo-Ya HD:1601594) o para ms informacin haga clic aqu or Text READY to 200-400 to register via text  Ms. Quizon - following are the goals we discussed in your visit today:   Goals Addressed             This Visit's Progress    Track and Manage My Symptoms-Depression       Timeframe:  Long-Range Goal Priority:  High Start Date:   12/24/20                          Expected End Date: ongoing                      Follow Up Date 02/23/21   - avoid negative self-talk - develop a personal safety plan - develop a plan to deal with triggers like holidays, anniversaries - exercise at least 2 to 3 times per week - have a plan for how to handle bad days - journal feelings and what helps to feel better or worse - spend  time or talk with others at least 2 to 3 times per week - spend time or talk with others every day - watch for early signs of feeling worse - write in journal every day    Why is this important?   Keeping track of your progress will help your treatment team find the right mix of medicine and therapy for you.  Write in your journal every day.  Day-to-day changes in depression symptoms are normal. It may be more helpful to check your progress at the end of each week instead of every day.     Notes:        [name], BSW, MSW, [employer] Managed Medicaid LCSW [email or web address] Phone: [phone]

## 2021-01-25 NOTE — Patient Outreach (Signed)
Medicaid Managed Care Social Work Note  01/25/2021 Name:  [name] MRN:  [id number] DOB:  [birth year]  [name] is an 45 y.o. year old female who is Page primary patient of Lattie Haw, MD.  The Medicaid Managed Care Coordination team was consulted for assistance with:  [name] and Resources  [name] was given information about Medicaid Managed CareCoordination services today. [name] agreed to services and verbal consent obtained.  Engaged with patient  for by telephone forfollow up visit in response to referral for case management and/or care coordination services.   Assessments/Interventions:  Review of past medical history, allergies, medications, health status, including review of consultants reports, laboratory and other test data, was performed as part of comprehensive evaluation and provision of chronic care management services.  SDOH: (Social Determinant of Health) assessments and interventions performed: SDOH Interventions    Flowsheet Row Most Recent Value  SDOH Interventions   SDOH Interventions for the Following Domains Stress  Stress Interventions Provide Counseling  Depression Interventions/Treatment  Currently on Treatment, Medication       Advanced Directives Status:  See Care Plan for related entries.  Care Plan                 Allergies  Allergen Reactions   Latex Itching    Positive Skin Testing at allergist.   Hydrocodone-Acetaminophen Itching and Nausea And Vomiting    And dizziness    Medications Reviewed Today     Reviewed by Wilber Oliphant, MD (Resident) on 12/02/20 at 1611  Med List Status: <None>   Medication Order Taking? Sig Documenting Provider Last Dose Status Informant  amLODipine (NORVASC) 5 MG tablet [id number]  Take 1 tablet (5 mg total) by mouth daily.  Patient taking differently: Take 5 mg by mouth in the morning.   Wilber Oliphant, MD  Active   atorvastatin (LIPITOR) 80 MG tablet [id number]   TAKE 1 TABLET BY MOUTH DAILY.  Patient taking differently: Take 80 mg by mouth every evening.   Wilber Oliphant, MD  Active   busPIRone (BUSPAR) 15 MG tablet [id number]  Take 15 mg by mouth 3 (three) times daily as needed. [provider]  Active   diclofenac Sodium (VOLTAREN) 1 % GEL [id number]  Apply 2 g topically 4 (four) times daily.  Patient taking differently: Apply 2 g topically 4 (four) times daily as needed (pain).   Marcia Floro, DO  Active   gabapentin (NEURONTIN) 300 MG capsule [id number]  Take 3 capsules (900 mg total) by mouth 3 (three) times daily. Wilber Oliphant, MD  Active   hydrOXYzine (ATARAX/VISTARIL) 25 MG tablet [id number]  Take 25 mg by mouth 3 (three) times daily as needed for anxiety. [provider]  Active Self           Med Note (Marcia, MELANIE Page   Tue Dec 01, 2020  9:35 AM) Taking for hives related to anxiety  Multiple Vitamin (MULTIVITAMIN WITH MINERALS) TABS tablet [id number]  Take 1 tablet by mouth daily at 2 PM. Women's Multivitamin [provider]  Active Self  oxyCODONE-acetaminophen (PERCOCET/ROXICET) 5-325 MG tablet [id number]  Take 1-2 tablets by mouth every 4 (four) hours as needed for moderate pain or severe pain. Marcia Pies, MD  Active   sertraline (ZOLOFT) 100 MG tablet [name]  Take 100 mg by mouth every evening. [provider]  Active Self  topiramate (TOPAMAX) 25 MG tablet [id number] Yes Take 25  mg by mouth in the morning and at bedtime. [provider]  Active Self           Med Note (Marcia, MELANIE Page   Tue Dec 01, 2020  9:29 AM) Taking one tablet every evening only  traZODone (DESYREL) 100 MG tablet [name] Yes Take 100 mg by mouth at bedtime as needed for sleep. [provider]  Active Self            Patient Active Problem List   Diagnosis Date Noted   Fibromyalgia 12/02/2020   Cervical spondylosis with myelopathy and radiculopathy 10/23/2020   Left shoulder pain 08/13/2020    Muscle spasticity 08/11/2020   BMI 34.0-34.9,adult 10/23/2019   Anxiety 10/08/2019   Healthcare maintenance 10/08/2019   Proteinuria 02/21/2018   Arthralgia of multiple joints 02/21/2018   Major depression, recurrent ([place]) 10/31/2017   HTN (hypertension) 10/31/2017   Incontinence in female 10/31/2017   Mixed hyperlipidemia 10/31/2017   PTSD (post-traumatic stress disorder) 10/30/2017    Conditions to be addressed/monitored per PCP order:  Anxiety and Depression  Care Plan : LCSW Plan of Care  Updates made by Greg Cutter, LCSW since 01/25/2021 12:00 AM     Problem: Anxiety Identification (Anxiety)      Long-Range Goal: Anxiety Symptoms Identified   Start Date: 12/24/2020  Priority: High  Note:   Timeframe:  Long-Range Goal Priority:  High Start Date:   12/24/20                          Expected End Date: ongoing                      Follow Up Date 02/23/21  Current barriers:   Chronic Mental Health needs related to anxiety, bipolar, and depression Limited social support, ADL IADL limitations, and Mental Health Concerns  Needs Support, Education, and Care Coordination in order to meet unmet mental health needs. Clinical Goal(s): patient will work with [facility] LCSW to improve her overall mental health needs. Patient as PTSD, bipolar, depression and anxiety.  Patient will implement healthy coping skills into her routine over the next 90 days. Clinical Interventions:  Assessed patient's previous and current treatment, coping skills, support system and barriers to care  Review various resources, discussed options and provided patient information about Department of Social Services (food stamps, [place], and utilities assistance) Depression screen reviewed , Solution-Focused Strategies, Mindfulness or Psychologist, educational, Active listening / Reflection utilized , Emotional Supportive Provided, Behavioral Activation, [place] , Brief CBT , Suicidal Ideation/Homicidal  Ideation assessed:, and PHQ2/ PHQ9 completed ; Patient interviewed and appropriate assessments performed Discussed plans with patient for ongoing care management follow up and provided patient with direct contact information for care management team Assisted patient/caregiver with obtaining information about health plan benefits Provided education and assistance to client regarding Advanced Directives. Discussed several options for long term counseling based on need and insurance.  Assisted patient with narrowing the options down to [facility] ) Patient wishes to consider this referral over the next 30 days again. [facility] LCSW will follow up regarding this concern on 02/23/21. Patient will continue therapy and medication management services at [facility]. However, patient is upset that she continues to get new providers because the turn over rate for employees there is high.  Patient wishes to apply for disability as she has several health conditions that prevent her from being able to work.  [facility] LCSW provided patient with information on how to apply for services. Patient wishes to apply in person rather than over the phone or online. [facility] LCSW sent patient Page text message on 01/25/21 with the [employer] office address, number and their hours. [facility] LCSW also provided patient with the toll free number in case she wishes to apply for disability by phone. Patient was very appreciative of this resource education and information. Inter-disciplinary care team collaboration (see longitudinal plan of care) Patient Goals/Self-Care Activities: Over the next 120 days - barriers to treatment adherence identified - complementary therapy use encouraged - counseling provided - depression screen reviewed - emotional liability acknowledged and normalized - full diagnostic interview performed - immediate suicide evaluation arranged - medication side effects managed - participation  in mental health treatment encouraged - response to pharmacologic therapy monitored - self-awareness of emotional triggers encouraged - avoid negative self-talk - develop Page personal safety plan - develop Page plan to deal with triggers like holidays, anniversaries - exercise at least 2 to 3 times per week - have Page plan for how to handle bad days - journal feelings and what helps to feel better or worse - spend time or talk with others at least 2 to 3 times per week - spend time or talk with others every day - watch for early signs of feeling worse - begin personal counseling - call and visit an old friend - check out volunteer opportunities - join Page support group - laugh; watch Page funny movie or comedian - learn and use visualization or guided imagery - perform Page random act of kindness - practice relaxation or meditation daily - start or continue Page personal journal - talk about feelings with Page friend, family or spiritual advisor - practice positive thinking and self-talk Call your insurance provider for more information about your Enhanced Benefits  Continue with therapy Continue with compliance of taking medication   Depression screen [facility] 2/9 01/25/2021 12/24/2020 12/02/2020 08/07/2020 02/07/2020  Decreased Interest '1 1 1 3 2  '$ Down, Depressed, Hopeless '1 1 1 3 2  '$ PHQ - 2 Score '2 2 2 6 4  '$ Altered sleeping '2 3 3 3 3  '$ Tired, decreased energy '3 3 3 3 3  '$ Change in appetite '2 3 3 3 3  '$ Feeling bad or failure about yourself  '2 2 3 '$ 0 0  Trouble concentrating '1 1 3 '$ 0 3  Moving slowly or fidgety/restless 0 0 0 0 3  Suicidal thoughts 0 0 0 0 0  PHQ-9 Score '12 14 17 15 19  '$ Difficult doing work/chores Somewhat difficult Very difficult Extremely dIfficult Extremely dIfficult -  Some recent data might be hidden       Follow up:  Patient agrees to Care Plan and Follow-up.  Plan: The Managed Medicaid care management team will reach out to the patient again over the next 30 days.  Date of next  scheduled Social Work care management/care coordination outreach:  02/23/21  [name], BSW, MSW, LCSW Managed Medicaid LCSW [email or web address] Phone: [phone]

## 2021-01-28 ENCOUNTER — Other Ambulatory Visit: Payer: Self-pay

## 2021-01-28 ENCOUNTER — Other Ambulatory Visit: Payer: Self-pay | Admitting: *Deleted

## 2021-01-28 NOTE — Patient Instructions (Signed)
Visit Information  [name] was given information about Medicaid Managed Care team care coordination services as a part of their [employer] benefit. [name] verbally consented to engagement with the [facility] Managed Care team.   If you are experiencing a medical emergency, please call 911 or report to your local emergency department or urgent care.   If you have a non-emergency medical problem during routine business hours, please contact your provider's office and ask to speak with a nurse.   For questions related to your [facility], please call: [phone] or visit the homepage here: [email or web address]  If you would like to schedule transportation through your [facility], please call the following number at least 2 days in advance of your appointment: [phone].   Call the [place] at [phone], at any time, 24 hours a day, 7 days a week. If you are in danger or need immediate medical attention call 911.  If you would like help to quit smoking, call 1-800-QUIT-NOW 971-194-7505) OR Espaol: 1-855-Djelo-Ya HD:1601594) o para ms informacin haga clic aqu or Text READY to 200-400 to register via text  [name] - following are the goals we discussed in your visit today:   Goals Addressed             This Visit's Progress    Make and Keep All Appointments       Timeframe:  Long-Range Goal Priority:  High Start Date:   12/01/20                          Expected End Date:  03/02/21                     Follow Up Date 03/02/2021    - make a PCP appointment and attend - call to cancel if needed - keep a calendar with prescription refill dates - keep a calendar with appointment dates    Why is this important?   Part of staying healthy is seeing the doctor for follow-up care.  If you forget your appointments,  there are some things you can do to stay on track.         Protect My Health       Timeframe:  Long-Range Goal Priority:  High Start Date:   12/01/20                          Expected End Date: 03/02/21                      Follow Up Date 03/02/21    - contact [place] [phone] for member benefits (free phone, youth membership to [facility] for your son) - schedule recommended health tests (blood work, mammogram, colonoscopy, pap test) - schedule and keep appointment for annual check-up - schedule eye exam - increase intake of fruits and vegetables - work with MM Pharmacist, [name] for medication management  - work with MM LCSW, [name]   Why is this important?   Screening tests can find diseases early when they are easier to treat.  Your doctor or nurse will talk with you about which tests are important for you.  Getting shots for common diseases like the flu and shingles will help prevent them.             Please see education  materials related to pain and high Cholesterol provided by MyChart link.  Patient verbalizes understanding of instructions provided today.   Telephone follow up appointment with Managed Medicaid care management team member scheduled for:03/02/21 @ [place] RN, BSN [place] RN Care Coordinator   Following is a copy of your plan of care:  Patient Care Plan: General Plan of Care (Adult)     Problem Identified: Health Promotion or Disease Self-Management (General Plan of Care)      Long-Range Goal: Self-Management Plan Developed   Start Date: 12/01/2020  Expected End Date: 03/02/2021  Recent Progress: On track  Priority: High  Note:   Current Barriers:  Ineffective Self Health Maintenance-[name] is recovering from spinal surgery. She is managing her pain and activity. She understands she has to limit and space out her activity. She has a 14 year old son at home and a sister that is very helpful. She is able to  manage doing her chores around the home and errands like grocery shopping. She has been making adjustments to her diet and taking Lipitor to manage hyperlipidemia over the last year. She was unable to attend her follow up visit with the PCP due to having her spinal surgery. Patient called [facility] upset about her follow up with PCP. She has concerns regarding her care and would like to switch PCP's. Patient struggles with anxiety and PTSD, as well as multiple health issues prior to having back surgery. She needs an office close to home.-Update-Patient continues to have increased pain (ankles to toes, shoulders, neck and back). She has an appointment with Pain Management 8/17. She has not started Zetia due to concerns about side effects. Unable to independently manage hyperlipidemia Currently UNABLE TO independently self manage needs related to chronic health conditions.  Knowledge Deficits related to short term plan for care coordination needs and long term plans for chronic disease management needs Nurse Case Manager Clinical Goal(s):  patient will work with care management team to address care coordination and chronic disease management needs related to Disease Management   Interventions:  Evaluation of current treatment plan related to hyperlipidemia and patient's adherence to plan as established by provider. Reviewed medications and discussed gabapentin prescription directions Collaborated with [name] to schedule patient with MM Pharmacist Educated patient on diet for lowering cholesterol Provided information on managing pain Discussed plans with patient for ongoing care management follow up and provided patient with direct contact information for care management team Provided therapeutic listening Provided number to [facility] [phone] for patient to inquire about member benefits (free phone and [employer] membership for patient's son) Self Care Activities:  Patient will self administer medications as  prescribed Patient will attend all scheduled provider appointments Patient will call pharmacy for medication refills Patient will call provider office for new concerns or questions Patient will call and schedule PCP appointment and eye exam Patient Goals: - contact [employer] [phone] for member benefits (free phone, youth membership to [facility] for your son) - call to cancel if needed - make a PCP appointment and attend - keep a calendar with prescription refill dates - keep a calendar with appointment dates - schedule recommended health tests (blood work, mammogram, colonoscopy, pap test) - schedule and keep appointment for annual check-up - schedule eye exam - increase intake of fruits and vegetables - work with MM Pharmacist, [name] for medication management  - work with MM LCSW, [name]  Follow Up Plan: Telephone follow up appointment with care management  team member scheduled for: 03/02/21 @ 1pm    Patient Care Plan: Medication Coordination     Problem Identified: Health Promotion or Disease Self-Management (General Plan of Care)      Goal: Medication Coordination   Note:   Current Barriers:  Does not maintain contact with provider office   Pharmacist Clinical Goal(s):  Over the next 90 days, patient will contact provider office for questions/concerns as evidenced notation of same in electronic health record through collaboration with PharmD and provider.    Interventions: Inter-disciplinary care team collaboration (see longitudinal plan of care) Comprehensive medication review performed; medication list updated in electronic medical record   Patient Goals/Self-Care Activities Over the next 90 days, patient will:  - collaborate with provider on medication access solutions  Follow Up Plan: The patient has been provided with contact information for the care management team and has been advised to call with any health related questions or concerns.       Patient Care  Plan: LCSW Plan of Care     Problem Identified: Anxiety Identification (Anxiety)      Long-Range Goal: Anxiety Symptoms Identified   Start Date: 12/24/2020  Priority: High  Note:   Timeframe:  Long-Range Goal Priority:  High Start Date:   12/24/20                          Expected End Date: ongoing                      Follow Up Date 02/23/21  Current barriers:   Chronic Mental Health needs related to anxiety, bipolar, and depression Limited social support, ADL IADL limitations, and Mental Health Concerns  Needs Support, Education, and Care Coordination in order to meet unmet mental health needs. Clinical Goal(s): patient will work with [facility] LCSW to improve her overall mental health needs. Patient as PTSD, bipolar, depression and anxiety.  Patient will implement healthy coping skills into her routine over the next 90 days. Clinical Interventions:  Assessed patient's previous and current treatment, coping skills, support system and barriers to care  Review various resources, discussed options and provided patient information about Department of Social Services (food stamps, [place], and utilities assistance) Depression screen reviewed , Solution-Focused Strategies, Mindfulness or Psychologist, educational, Active listening / Reflection utilized , Emotional Supportive Provided, Behavioral Activation, [place] , Brief CBT , Suicidal Ideation/Homicidal Ideation assessed:, and PHQ2/ PHQ9 completed ; Patient interviewed and appropriate assessments performed Discussed plans with patient for ongoing care management follow up and provided patient with direct contact information for care management team Assisted patient/caregiver with obtaining information about health plan benefits Provided education and assistance to client regarding Advanced Directives. Discussed several options for long term counseling based on need and insurance.  Assisted patient with narrowing the options down to  [facility] ) Patient wishes to consider this referral over the next 30 days again. [facility] LCSW will follow up regarding this concern on 02/23/21. Patient will continue therapy and medication management services at [facility]. However, patient is upset that she continues to get new providers because the turn over rate for employees there is high.  Patient wishes to apply for disability as she has several health conditions that prevent her from being able to work. [facility] LCSW provided patient with information on how to apply for services. Patient wishes to apply in person rather than over the phone or online. [facility] LCSW sent  patient a text message on 01/25/21 with the [employer] office address, number and their hours. [facility] LCSW also provided patient with the toll free number in case she wishes to apply for disability by phone. Patient was very appreciative of this resource education and information. Inter-disciplinary care team collaboration (see longitudinal plan of care) Patient Goals/Self-Care Activities: Over the next 120 days - barriers to treatment adherence identified - complementary therapy use encouraged - counseling provided - depression screen reviewed - emotional liability acknowledged and normalized - full diagnostic interview performed - immediate suicide evaluation arranged - medication side effects managed - participation in mental health treatment encouraged - response to pharmacologic therapy monitored - self-awareness of emotional triggers encouraged - avoid negative self-talk - develop a personal safety plan - develop a plan to deal with triggers like holidays, anniversaries - exercise at least 2 to 3 times per week - have a plan for how to handle bad days - journal feelings and what helps to feel better or worse - spend time or talk with others at least 2 to 3 times per week - spend time or talk with others every day - watch for early signs  of feeling worse - begin personal counseling - call and visit an old friend - check out volunteer opportunities - join a support group - laugh; watch a funny movie or comedian - learn and use visualization or guided imagery - perform a random act of kindness - practice relaxation or meditation daily - start or continue a personal journal - talk about feelings with a friend, family or spiritual advisor - practice positive thinking and self-talk Call your insurance provider for more information about your Enhanced Benefits  Continue with therapy Continue with compliance of taking medication   Depression screen [facility] 2/9 01/25/2021 12/24/2020 12/02/2020 08/07/2020 02/07/2020  Decreased Interest '1 1 1 3 2  '$ Down, Depressed, Hopeless '1 1 1 3 2  '$ PHQ - 2 Score '2 2 2 6 4  '$ Altered sleeping '2 3 3 3 3  '$ Tired, decreased energy '3 3 3 3 3  '$ Change in appetite '2 3 3 3 3  '$ Feeling bad or failure about yourself  '2 2 3 '$ 0 0  Trouble concentrating '1 1 3 '$ 0 3  Moving slowly or fidgety/restless 0 0 0 0 3  Suicidal thoughts 0 0 0 0 0  PHQ-9 Score '12 14 17 15 19  '$ Difficult doing work/chores Somewhat difficult Very difficult Extremely dIfficult Extremely dIfficult -  Some recent data might be hidden

## 2021-01-28 NOTE — Patient Outreach (Signed)
Medicaid Managed Care   Nurse Care Manager Note  01/28/2021 Name:  [name] MRN:  [id number] DOB:  [birth year]  [name] is an 45 y.o. year old female who is a primary patient of Lattie Haw, MD.  The [facility] Managed Care Coordination team was consulted for assistance with:    HTN Hypertriglyceridemia pain  [name] was given information about Medicaid Managed Care Coordination team services today. [name] Patient agreed to services and verbal consent obtained.  Engaged with patient by telephone for follow up visit in response to provider referral for case management and/or care coordination services.   Assessments/Interventions:  Review of past medical history, allergies, medications, health status, including review of consultants reports, laboratory and other test data, was performed as part of comprehensive evaluation and provision of chronic care management services.  SDOH (Social Determinants of Health) assessments and interventions performed: SDOH Interventions    Flowsheet Row Most Recent Value  SDOH Interventions   Food Insecurity Interventions Intervention Not Indicated  Housing Interventions Intervention Not Indicated  Transportation Interventions Intervention Not Indicated       Care Plan  Allergies  Allergen Reactions   Latex Itching    Positive Skin Testing at allergist.   Hydrocodone-Acetaminophen Itching and Nausea And Vomiting    And dizziness    Medications Reviewed Today     Reviewed by Melissa Montane, RN (Registered Nurse) on 01/28/21 at 1527  Med List Status: <None>   Medication Order Taking? Sig Documenting Provider Last Dose Status Informant  amLODipine (NORVASC) 5 MG tablet [id number]  TAKE 1 TABLET (5 MG TOTAL) BY MOUTH DAILY. Wilber Oliphant, MD  Active   atorvastatin (LIPITOR) 80 MG tablet [id number] Yes TAKE 1 TABLET BY MOUTH DAILY.  Patient taking differently: Take 80 mg by mouth every evening.   Wilber Oliphant, MD Taking Active    busPIRone (BUSPAR) 15 MG tablet [id number] Yes Take 15 mg by mouth 3 (three) times daily as needed. [provider] Taking Active   diclofenac Sodium (VOLTAREN) 1 % GEL [id number]  Apply 2 g topically 4 (four) times daily.  Patient taking differently: Apply 2 g topically 4 (four) times daily as needed (pain).   [name], DO  Active   ezetimibe (ZETIA) 10 MG tablet [id number] No Take 1 tablet (10 mg total) by mouth daily.  Patient not taking: Reported on 01/28/2021   Wilber Oliphant, MD Not Taking Active   gabapentin (NEURONTIN) 600 MG tablet [id number] Yes Take 1.5 tablets (900 mg total) by mouth 3 (three) times daily. Wilber Oliphant, MD Taking Active            Med Note Thamas Jaegers, Aeon Kessner A   Thu Jan 28, 2021  3:24 PM) Patient taking '600mg'$  3 times a day, confused about dose due to quantity  HYDROcodone-acetaminophen (NORCO/VICODIN) 5-325 MG tablet [id number] Yes Take by mouth. [provider] Taking Active   hydrOXYzine (ATARAX/VISTARIL) 25 MG tablet [id number] Yes Take 25 mg by mouth 3 (three) times daily as needed for anxiety. [provider] Taking Active Self           Med Note (Clemons Salvucci A   Tue Dec 01, 2020  9:35 AM) Taking for hives related to anxiety  Lurasidone HCl (LATUDA) 60 MG TABS [id number] No   Patient not taking: Reported on 01/28/2021   [provider] Not Taking Active   Multiple Vitamin (MULTIVITAMIN WITH MINERALS) TABS tablet [id number] Yes Take 1 tablet  by mouth daily at 2 PM. Women's Multivitamin [provider] Taking Active Self  oxyCODONE-acetaminophen (PERCOCET/ROXICET) 5-325 MG tablet [id number] No Take 1-2 tablets by mouth every 4 (four) hours as needed for moderate pain or severe pain.  Patient not taking: Reported on 01/28/2021   Newman Pies, MD Not Taking Active   sertraline (ZOLOFT) 100 MG tablet [id number] Yes Take 100 mg by mouth every evening. [provider] Taking Active Self  topiramate (TOPAMAX) 25 MG  tablet [id number] Yes Take 25 mg by mouth in the morning and at bedtime. [provider] Taking Active Self           Med Note (Avrian Delfavero A   Thu Jan 28, 2021  3:26 PM)    traZODone (DESYREL) 100 MG tablet [id number] Yes Take 100 mg by mouth at bedtime as needed for sleep. [provider] Taking Active Self            Patient Active Problem List   Diagnosis Date Noted   Fibromyalgia 12/02/2020   Cervical spondylosis with myelopathy and radiculopathy 10/23/2020   Left shoulder pain 08/13/2020   Muscle spasticity 08/11/2020   BMI 34.0-34.9,adult 10/23/2019   Anxiety 10/08/2019   Healthcare maintenance 10/08/2019   Proteinuria 02/21/2018   Arthralgia of multiple joints 02/21/2018   Major depression, recurrent ([place]) 10/31/2017   HTN (hypertension) 10/31/2017   Incontinence in female 10/31/2017   Mixed hyperlipidemia 10/31/2017   PTSD (post-traumatic stress disorder) 10/30/2017    Conditions to be addressed/monitored per PCP order:  HTN, Hypertriglyceridemia, and pain  Care Plan : General Plan of Care (Adult)  Updates made by Melissa Montane, RN since 01/28/2021 12:00 AM     Problem: Health Promotion or Disease Self-Management (General Plan of Care)      Long-Range Goal: Self-Management Plan Developed   Start Date: 12/01/2020  Expected End Date: 03/02/2021  Recent Progress: On track  Priority: High  Note:   Current Barriers:  Ineffective Self Health Maintenance-[name] is recovering from spinal surgery. She is managing her pain and activity. She understands she has to limit and space out her activity. She has a 70 year old son at home and a sister that is very helpful. She is able to manage doing her chores around the home and errands like grocery shopping. She has been making adjustments to her diet and taking Lipitor to manage hyperlipidemia over the last year. She was unable to attend her follow up visit with the PCP due to having her spinal surgery.  Patient called [name] upset about her follow up with PCP. She has concerns regarding her care and would like to switch PCP's. Patient struggles with anxiety and PTSD, as well as multiple health issues prior to having back surgery. She needs an office close to home.-Update-Patient continues to have increased pain (ankles to toes, shoulders, neck and back). She has an appointment with Pain Management 8/17. She has not started Zetia due to concerns about side effects. Unable to independently manage hyperlipidemia Currently UNABLE TO independently self manage needs related to chronic health conditions.  Knowledge Deficits related to short term plan for care coordination needs and long term plans for chronic disease management needs Nurse Case Manager Clinical Goal(s):  patient will work with care management team to address care coordination and chronic disease management needs related to Disease Management   Interventions:  Evaluation of current treatment plan related to hyperlipidemia and patient's adherence to plan as established by provider. Reviewed medications and discussed  gabapentin prescription directions Collaborated with Anderson Malta to schedule patient with MM Pharmacist Educated patient on diet for lowering cholesterol Provided information on managing pain Discussed plans with patient for ongoing care management follow up and provided patient with direct contact information for care management team Provided therapeutic listening Provided number to [facility] [phone] for patient to inquire about member benefits (free phone and [employer] membership for patient's son) Self Care Activities:  Patient will self administer medications as prescribed Patient will attend all scheduled provider appointments Patient will call pharmacy for medication refills Patient will call provider office for new concerns or questions Patient will call and schedule PCP appointment and eye exam Patient Goals: - contact [employer]  [phone] for member benefits (free phone, youth membership to [facility] for your son) - call to cancel if needed - make a PCP appointment and attend - keep a calendar with prescription refill dates - keep a calendar with appointment dates - schedule recommended health tests (blood work, mammogram, colonoscopy, pap test) - schedule and keep appointment for annual check-up - schedule eye exam - increase intake of fruits and vegetables - work with MM Pharmacist, Ovid Curd for medication management  - work with MM LCSW, [place]  Follow Up Plan: Telephone follow up appointment with care management team member scheduled for: 03/02/21 @ 1pm     Follow Up:  Patient agrees to Care Plan and Follow-up.  Plan: The Managed Medicaid care management team will reach out to the patient again over the next 30 days.  Date/time of next scheduled RN care management/care coordination outreach:  03/02/21 @ 1pm  Marcia Joiner RN, BSN [place]  [employer] manager

## 2021-01-29 ENCOUNTER — Other Ambulatory Visit: Payer: Self-pay | Admitting: Family Medicine

## 2021-02-01 ENCOUNTER — Other Ambulatory Visit: Payer: Self-pay

## 2021-02-01 MED ORDER — GABAPENTIN 600 MG PO TABS: 900.0000 mg | ORAL_TABLET | Freq: Three times a day (TID) | ORAL | 2 refills | Status: DC

## 2021-02-01 NOTE — Telephone Encounter (Signed)
Patient calls nurse line requesting a full months refill on Gabapentin. Patient reports she had some left over from a previous provider, however she is completely out as of today. Patient states she takes 4.5 tablets a day. Patient is requesting #135 each month. Please advise.

## 2021-02-03 ENCOUNTER — Other Ambulatory Visit: Payer: Self-pay

## 2021-02-03 DIAGNOSIS — M5441 Lumbago with sciatica, right side: Secondary | ICD-10-CM | POA: Diagnosis not present

## 2021-02-03 DIAGNOSIS — G8929 Other chronic pain: Secondary | ICD-10-CM | POA: Diagnosis not present

## 2021-02-03 DIAGNOSIS — Z981 Arthrodesis status: Secondary | ICD-10-CM | POA: Diagnosis not present

## 2021-02-03 DIAGNOSIS — M4802 Spinal stenosis, cervical region: Secondary | ICD-10-CM | POA: Diagnosis not present
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
## 2021-02-23 ENCOUNTER — Telehealth: Payer: Self-pay | Admitting: Licensed Clinical Social Worker

## 2021-02-23 ENCOUNTER — Ambulatory Visit: Payer: Self-pay

## 2021-02-23 NOTE — Patient Instructions (Signed)
[name] ,   The [facility] Managed Care Team is available to provide assistance to you with your healthcare needs at no cost and as a benefit of your [facility] Health plan. I'm sorry I was unable to reach you today for our scheduled appointment. Our care guide will call you to reschedule our telephone appointment. Please call me at the number below. I am available to be of assistance to you regarding your healthcare needs. .   Thank you,   [name], BSW, MSW, LCSW Managed Medicaid [email or web address] Phone: [phone]

## 2021-02-23 NOTE — Patient Outreach (Signed)
[place] [facility]) Care Management  02/23/2021  [name] November 18, 1975 [id number]   LCSW completed [facility] outreach attempt today but was unable to reach patient successfully. A HIPPA compliant voice message was left encouraging patient to return call once available. LCSW will ask Scheduling Care Guide to reschedule [facility] appointment with patient as well.  [name], BSW, MSW, [employer] Managed Medicaid LCSW [email or web address] Phone: [phone]

## 2021-02-24 ENCOUNTER — Telehealth: Payer: Self-pay | Admitting: Family Medicine

## 2021-02-24 NOTE — Telephone Encounter (Signed)
..   Medicaid Managed Care   Unsuccessful Outreach Note  02/24/2021 Name: [name] MRN: [id number] DOB: 05/20/1976  Referred by: Lattie Haw, MD Reason for referral : High Risk Managed Medicaid (I called this patient today to get her phone visit with the MM LCSW rescheduled but she did not answer and there was no VM.)   An unsuccessful telephone outreach was attempted today. The patient was referred to the case management team for assistance with care management and care coordination.   Follow Up Plan: The care management team will reach out to the patient again over the next 5 days.   [place]

## 2021-02-26 ENCOUNTER — Ambulatory Visit (INDEPENDENT_AMBULATORY_CARE_PROVIDER_SITE_OTHER): Payer: Medicaid Other | Admitting: Family Medicine

## 2021-02-26 ENCOUNTER — Other Ambulatory Visit: Payer: Self-pay

## 2021-02-26 ENCOUNTER — Encounter: Payer: Self-pay | Admitting: Family Medicine

## 2021-02-26 VITALS — BP 126/86 | HR 70 | Ht 68.0 in | Wt 218.1 lb

## 2021-02-26 DIAGNOSIS — R238 Other skin changes: Secondary | ICD-10-CM | POA: Diagnosis not present

## 2021-02-26 DIAGNOSIS — Z23 Encounter for immunization: Secondary | ICD-10-CM

## 2021-02-26 DIAGNOSIS — M2550 Pain in unspecified joint: Secondary | ICD-10-CM | POA: Diagnosis present

## 2021-02-26 NOTE — Progress Notes (Signed)
    SUBJECTIVE:   CHIEF COMPLAINT / HPI:   Discuss Rheumatology Referral Patient reports her neurosurgeon (Dr. Annette Stable) and her pain management provider Leonie Green, also through [facility]) recommend rheumatology referral.  Patient reports an extensive history of multiple joint aches and body pain. Was diagnosed with fibromyalgia many years ago but states it's getting worse with age. She has also had skin issues intermittently over the past 10 years. She gets hives that last up to 6 weeks. Has had several ED visits for this and it was thought to be an autoimmune reaction. She was seen by an allergist but the only allergen detected on skin testing was latex.   In light of her long history of pain in multiple joints and skin concerns, patient and her neurosurgery providers feel she should be evaluated by rheumatology. States she has to lay down several times per day due to her polyarthralgias.     PERTINENT  PMH / PSH: HTN, HLD, PTSD, depression, fibromyalgia  OBJECTIVE:   BP 126/86   Pulse 70   Ht '5\' 8"'$  (1.727 m)   Wt 218 lb 2 oz (98.9 kg)   LMP 01/08/2014   SpO2 99%   BMI 33.17 kg/m   General: NAD, pleasant, able to participate in exam Cardiac: RRR, S1 S2 present. normal heart sounds, no murmurs. Respiratory: CTAB, normal effort, No wheezes, rales or rhonchi Extremities: no edema or cyanosis. Skin:    Neuro: alert, no obvious focal deficits Psych: Normal affect and mood   ASSESSMENT/PLAN:   Polyarthralgias Patient with long history of polyarthralgias. Diagnosed with fibromyalgia many years ago. Given her skin involvement, her neurosurgeon and pain management provider feel rheumatology evaluation would be helpful. I agree this is reasonable. She did have normal ANA, rheumatoid factor and CCP in 2019. Skin changes on lower extremities appear to be related to excoriation and skin changes on forearms most consistent with sun exposure and excoriation. -Placed referral to  rheumatology   Alcus Dad, MD [place]

## 2021-02-26 NOTE — Patient Instructions (Signed)
It was great to meet you!  I have placed a referral to rheumatology as requested. Someone should call you to arrange an appointment or discuss the status of the referral. This can take up to 2 weeks.  Take care and seek immediate care sooner if you develop any concerns.  Dr. [facility]

## 2021-03-02 ENCOUNTER — Other Ambulatory Visit: Payer: Self-pay | Admitting: *Deleted

## 2021-03-02 ENCOUNTER — Other Ambulatory Visit: Payer: Self-pay

## 2021-03-02 NOTE — Patient Instructions (Signed)
Visit Information  [name] was given information about Medicaid Managed Care team care coordination services as a part of their [place] Medicaid benefit. [name] verbally consented to engagement with the [facility] Managed Care team.   If you are experiencing a medical emergency, please call 911 or report to your local emergency department or urgent care.   If you have a non-emergency medical problem during routine business hours, please contact your provider's office and ask to speak with a nurse.   For questions related to your [facility], please call: [phone] or visit the homepage here: [email or web address]  If you would like to schedule transportation through your [facility], please call the following number at least 2 days in advance of your appointment: [phone].   Call the [place] at [phone], at any time, 24 hours a day, 7 days a week. If you are in danger or need immediate medical attention call 911.  If you would like help to quit smoking, call 1-800-QUIT-NOW (647)425-3134) OR Espaol: 1-855-Djelo-Ya QO:409462) o para ms informacin haga clic aqu or Text READY to 200-400 to register via text  [name] - following are the goals we discussed in your visit today:   Goals Addressed             This Visit's Progress    Make and Keep All Appointments       Timeframe:  Long-Range Goal Priority:  High Start Date:   12/01/20                          Expected End Date:  04/05/21                     Follow Up Date 04/05/2021    - attend all scheduled appointments, schedule a follow up with PCP - call to cancel if needed - keep a calendar with prescription refill dates - keep a calendar with appointment dates    Why is this important?   Part of staying healthy is seeing the doctor for follow-up care.  If  you forget your appointments, there are some things you can do to stay on track.         Protect My Health       Timeframe:  Long-Range Goal Priority:  High Start Date:   12/01/20                          Expected End Date: 04/02/21                      Follow Up Date 04/02/21    - contact [place] [phone] for member benefits (free phone, youth membership to [facility] for your son) - schedule recommended health tests (blood work, mammogram, colonoscopy, pap test) - schedule and keep appointment for annual check-up - schedule eye exam - increase intake of fruits and vegetables - work with MM Pharmacist, Ovid Curd for medication management  - work with MM LCSW, Jerene Pitch   Why is this important?   Screening tests can find diseases early when they are easier to treat.  Your doctor or nurse will talk with you about which tests are important for you.  Getting shots for common diseases like the flu and shingles will help prevent them.  Please see education materials related to joint pain provided by MyChart link.  Patient has access to MyChart and can view provided education  Telephone follow up appointment with Managed Medicaid care management team member scheduled for:04/05/21 @ 1pm  Lurena Joiner RN, BSN [place] RN Care Coordinator   Following is a copy of your plan of care:  Patient Care Plan: General Plan of Care (Adult)     Problem Identified: Health Promotion or Disease Self-Management (General Plan of Care)      Long-Range Goal: Self-Management Plan Developed   Start Date: 12/01/2020  Expected End Date: 04/05/2021  Recent Progress: On track  Priority: High  Note:   Current Barriers:  Ineffective Self Health Maintenance-[name] is recovering from spinal surgery. She is managing her pain and activity. She understands she has to limit and space out her activity. She has a 25 year old son at home and a sister that is very helpful.  She is able to manage doing her chores around the home and errands like grocery shopping. She has been making adjustments to her diet and taking Lipitor to manage hyperlipidemia over the last year. She was unable to attend her follow up visit with the PCP due to having her spinal surgery. Patient called [facility] upset about her follow up with PCP. She has concerns regarding her care and would like to switch PCP's. Patient struggles with anxiety and PTSD, as well as multiple health issues prior to having back surgery. She needs an office close to home. Patient continues to have increased pain (ankles to toes, shoulders, neck and back). She has an appointment with Pain Management 8/17. She has not started Zetia due to concerns about side effects.-Update-[name] has a better understanding of Zetia and why she is taking it after speaking with MM Pharmacist. She has an upcoming consult with Rheumatology for joint pain and skin issues as recommended by Pain management. She plans to schedule an appointment for mammogram. Unable to independently manage hyperlipidemia Currently UNABLE TO independently self manage needs related to chronic health conditions.  Knowledge Deficits related to short term plan for care coordination needs and long term plans for chronic disease management needs Nurse Case Manager Clinical Goal(s):  patient will work with care management team to address care coordination and chronic disease management needs related to Disease Management   Interventions:  Evaluation of current treatment plan related to hyperlipidemia and patient's adherence to plan as established by provider. Reviewed medications and updated Epic with changes Discussed recent appointments and future appointments with Neurosurgeon, Pain Management and Rheumatology consult Provided information on managing pain Discussed plans with patient for ongoing care management follow up and provided patient with direct contact information  for care management team Provided therapeutic listening Provided number to [facility] [phone] for patient to inquire about member benefits (free phone and [employer] membership for patient's son) Self Care Activities:  Patient will self administer medications as prescribed Patient will attend all scheduled provider appointments Patient will call pharmacy for medication refills Patient will call provider office for new concerns or questions Patient will call and schedule PCP appointment and eye exam Patient Goals: - contact [employer] [phone] for member benefits (free phone, youth membership to [facility] for your son) - call to cancel if needed - attend all scheduled appointments, schedule a follow up with PCP - keep a calendar with prescription refill dates - keep a calendar with appointment dates - schedule recommended health tests (blood work, mammogram,  colonoscopy, pap test) - schedule and keep appointment for annual check-up - schedule eye exam - increase intake of fruits and vegetables - work with MM Pharmacist, Ovid Curd for medication management  - work with MM LCSW, [place]  Follow Up Plan: Telephone follow up appointment with care management team member scheduled for: 04/05/21 @ 1pm    Patient Care Plan: Medication Coordination     Problem Identified: Health Promotion or Disease Self-Management (General Plan of Care)      Goal: Medication Coordination   Note:   Current Barriers:  Does not maintain contact with provider office   Pharmacist Clinical Goal(s):  Over the next 90 days, patient will contact provider office for questions/concerns as evidenced notation of same in electronic health record through collaboration with PharmD and provider.    Interventions: Inter-disciplinary care team collaboration (see longitudinal plan of care) Comprehensive medication review performed; medication list updated in electronic medical record    Patient Goals/Self-Care Activities Over the  next 90 days, patient will:  - collaborate with provider on medication access solutions  Follow Up Plan: The patient has been provided with contact information for the care management team and has been advised to call with any health related questions or concerns.       Patient Care Plan: LCSW Plan of Care     Problem Identified: Anxiety Identification (Anxiety)      Long-Range Goal: Anxiety Symptoms Identified   Start Date: 12/24/2020  Priority: High  Note:   Timeframe:  Long-Range Goal Priority:  High Start Date:   12/24/20                          Expected End Date: ongoing                      Follow Up Date 02/23/21  Current barriers:   Chronic Mental Health needs related to anxiety, bipolar, and depression Limited social support, ADL IADL limitations, and Mental Health Concerns  Needs Support, Education, and Care Coordination in order to meet unmet mental health needs. Clinical Goal(s): patient will work with [facility] LCSW to improve her overall mental health needs. Patient as PTSD, bipolar, depression and anxiety.  Patient will implement healthy coping skills into her routine over the next 90 days. Clinical Interventions:  Assessed patient's previous and current treatment, coping skills, support system and barriers to care  Review various resources, discussed options and provided patient information about Department of Social Services (food stamps, [place], and utilities assistance) Depression screen reviewed , Solution-Focused Strategies, Mindfulness or Psychologist, educational, Active listening / Reflection utilized , Emotional Supportive Provided, Behavioral Activation, [place] , Brief CBT , Suicidal Ideation/Homicidal Ideation assessed:, and PHQ2/ PHQ9 completed ; Patient interviewed and appropriate assessments performed Discussed plans with patient for ongoing care management follow up and provided patient with direct contact information for care management  team Assisted patient/caregiver with obtaining information about health plan benefits Provided education and assistance to client regarding Advanced Directives. Discussed several options for long term counseling based on need and insurance.  Assisted patient with narrowing the options down to [facility] ) Patient wishes to consider this referral over the next 30 days again. [facility] LCSW will follow up regarding this concern on 02/23/21. Patient will continue therapy and medication management services at [facility]. However, patient is upset that she continues to get new providers because the turn over rate for employees there is  high.  Patient wishes to apply for disability as she has several health conditions that prevent her from being able to work. [facility] LCSW provided patient with information on how to apply for services. Patient wishes to apply in person rather than over the phone or online. [facility] LCSW sent patient a text message on 01/25/21 with the [employer] office address, number and their hours. [facility] LCSW also provided patient with the toll free number in case she wishes to apply for disability by phone. Patient was very appreciative of this resource education and information. Inter-disciplinary care team collaboration (see longitudinal plan of care) Patient Goals/Self-Care Activities: Over the next 120 days - barriers to treatment adherence identified - complementary therapy use encouraged - counseling provided - depression screen reviewed - emotional liability acknowledged and normalized - full diagnostic interview performed - immediate suicide evaluation arranged - medication side effects managed - participation in mental health treatment encouraged - response to pharmacologic therapy monitored - self-awareness of emotional triggers encouraged - avoid negative self-talk - develop a personal safety plan - develop a plan to deal with triggers like  holidays, anniversaries - exercise at least 2 to 3 times per week - have a plan for how to handle bad days - journal feelings and what helps to feel better or worse - spend time or talk with others at least 2 to 3 times per week - spend time or talk with others every day - watch for early signs of feeling worse - begin personal counseling - call and visit an old friend - check out volunteer opportunities - join a support group - laugh; watch a funny movie or comedian - learn and use visualization or guided imagery - perform a random act of kindness - practice relaxation or meditation daily - start or continue a personal journal - talk about feelings with a friend, family or spiritual advisor - practice positive thinking and self-talk Call your insurance provider for more information about your Enhanced Benefits  Continue with therapy Continue with compliance of taking medication   Depression screen [facility] 2/9 01/25/2021 12/24/2020 12/02/2020 08/07/2020 02/07/2020  Decreased Interest '1 1 1 3 2  '$ Down, Depressed, Hopeless '1 1 1 3 2  '$ PHQ - 2 Score '2 2 2 6 4  '$ Altered sleeping '2 3 3 3 3  '$ Tired, decreased energy '3 3 3 3 3  '$ Change in appetite '2 3 3 3 3  '$ Feeling bad or failure about yourself  '2 2 3 '$ 0 0  Trouble concentrating '1 1 3 '$ 0 3  Moving slowly or fidgety/restless 0 0 0 0 3  Suicidal thoughts 0 0 0 0 0  PHQ-9 Score '12 14 17 15 19  '$ Difficult doing work/chores Somewhat difficult Very difficult Extremely dIfficult Extremely dIfficult -  Some recent data might be hidden

## 2021-03-02 NOTE — Patient Outreach (Signed)
Medicaid Managed Care   Nurse Care Manager Note  03/02/2021 Name:  [name] MRN:  [id number] DOB:  [birth year]  [name] is an 45 y.o. year old female who is a primary patient of Marcia Haw, MD.  The [facility] Managed Care Coordination team was consulted for assistance with:    Hypertriglyceridemia Chronic pain  [name] was given information about Medicaid Managed Care Coordination team services today. [name] Patient agreed to services and verbal consent obtained.  Engaged with patient by telephone for follow up visit in response to provider referral for case management and/or care coordination services.   Assessments/Interventions:  Review of past medical history, allergies, medications, health status, including review of consultants reports, laboratory and other test data, was performed as part of comprehensive evaluation and provision of chronic care management services.  SDOH (Social Determinants of Health) assessments and interventions performed:   Care Plan  Allergies  Allergen Reactions   Latex Itching    Positive Skin Testing at allergist.   Hydrocodone-Acetaminophen Itching and Nausea And Vomiting    And dizziness    Medications Reviewed Today     Reviewed by Marcia Montane, RN (Registered Nurse) on 03/02/21 at 1330  Med List Status: <None>   Medication Order Taking? Sig Documenting Provider Last Dose Status Informant  amLODipine (NORVASC) 5 MG tablet [id number] Yes TAKE 1 TABLET (5 MG TOTAL) BY MOUTH DAILY. Marcia Oliphant, MD Taking Active   atorvastatin (LIPITOR) 80 MG tablet [id number] Yes TAKE 1 TABLET BY MOUTH DAILY. Marcia Haw, MD Taking Active   busPIRone (BUSPAR) 15 MG tablet [id number] Yes Take 15 mg by mouth 3 (three) times daily as needed. [provider] Taking Active   diclofenac Sodium (VOLTAREN) 1 % GEL [id number] No Apply 2 g topically 4 (four) times daily.  Patient not taking: Reported on 03/02/2021   Marcia Floro, DO Not  Taking Active            Med Note (Yoshio Seliga A   Tue Mar 02, 2021  1:26 PM) Needs refill  ezetimibe (ZETIA) 10 MG tablet [id number] Yes Take 1 tablet (10 mg total) by mouth daily. Marcia Oliphant, MD Taking Active   gabapentin (NEURONTIN) 600 MG tablet [id number] Yes Take 1.5 tablets (900 mg total) by mouth 3 (three) times daily. Marcia Haw, MD Taking Active   HYDROcodone-acetaminophen (NORCO/VICODIN) 5-325 MG tablet [id number] Yes Take by mouth. [provider] Taking Active            Med Note (Jeter Tomey A   Tue Mar 02, 2021  1:17 PM) Taking 1.5tabs twice daily  hydrOXYzine (ATARAX/VISTARIL) 25 MG tablet [id number] Yes Take 25 mg by mouth 3 (three) times daily as needed for anxiety. [provider] Taking Active Self           Med Note (Dyami Umbach A   Tue Dec 01, 2020  9:35 AM) Taking for hives related to anxiety  Lurasidone HCl (LATUDA) 60 MG TABS [id number] No   Patient not taking: No sig reported   [provider] Not Taking Active   Multiple Vitamin (MULTIVITAMIN WITH MINERALS) TABS tablet [id number] Yes Take 1 tablet by mouth daily at 2 PM. Women's Multivitamin [provider] Taking Active Self  oxyCODONE-acetaminophen (PERCOCET/ROXICET) 5-325 MG tablet [id number] No Take 1-2 tablets by mouth every 4 (four) hours as needed for moderate pain or severe pain.  Patient not taking: No sig reported   Newman Pies, MD Not  Taking Active   sertraline (ZOLOFT) 100 MG tablet [id number] Yes Take 100 mg by mouth every evening. [provider] Taking Active Self  tiZANidine (ZANAFLEX) 4 MG tablet [id number] Yes Take 4 mg by mouth every 6 (six) hours as needed for muscle spasms. [provider] Taking Active   topiramate (TOPAMAX) 25 MG tablet [id number] Yes Take 25 mg by mouth in the morning and at bedtime. [provider] Taking Active Self           Med Note (Greig Altergott A   Thu Jan 28, 2021  3:26 PM)    traZODone (DESYREL) 100  MG tablet [id number] Yes Take 100 mg by mouth at bedtime as needed for sleep. [provider] Taking Active Self            Patient Active Problem List   Diagnosis Date Noted   Fibromyalgia 12/02/2020   Cervical spondylosis with myelopathy and radiculopathy 10/23/2020   Left shoulder pain 08/13/2020   Muscle spasticity 08/11/2020   BMI 34.0-34.9,adult 10/23/2019   Anxiety 10/08/2019   Healthcare maintenance 10/08/2019   Proteinuria 02/21/2018   Arthralgia of multiple joints 02/21/2018   Major depression, recurrent ([place]) 10/31/2017   HTN (hypertension) 10/31/2017   Incontinence in female 10/31/2017   Mixed hyperlipidemia 10/31/2017   PTSD (post-traumatic stress disorder) 10/30/2017    Conditions to be addressed/monitored per PCP order:  Hypertriglyceridemia and chronic pain  Care Plan : General Plan of Care (Adult)  Updates made by Marcia Montane, RN since 03/02/2021 12:00 AM     Problem: Health Promotion or Disease Self-Management (General Plan of Care)      Long-Range Goal: Self-Management Plan Developed   Start Date: 12/01/2020  Expected End Date: 04/05/2021  Recent Progress: On track  Priority: High  Note:   Current Barriers:  Ineffective Self Health Maintenance-[name] is recovering from spinal surgery. She is managing her pain and activity. She understands she has to limit and space out her activity. She has a 29 year old son at home and a sister that is very helpful. She is able to manage doing her chores around the home and errands like grocery shopping. She has been making adjustments to her diet and taking Lipitor to manage hyperlipidemia over the last year. She was unable to attend her follow up visit with the PCP due to having her spinal surgery. Patient called [facility] upset about her follow up with PCP. She has concerns regarding her care and would like to switch PCP's. Patient struggles with anxiety and PTSD, as well as multiple health issues prior to having  back surgery. She needs an office close to home. Patient continues to have increased pain (ankles to toes, shoulders, neck and back). She has an appointment with Pain Management 8/17. She has not started Zetia due to concerns about side effects.-Update-Ms. Bibian has a better understanding of Zetia and why she is taking it after speaking with MM Pharmacist. She has an upcoming consult with Rheumatology for joint pain and skin issues as recommended by Pain management. She plans to schedule an appointment for mammogram. Unable to independently manage hyperlipidemia Currently UNABLE TO independently self manage needs related to chronic health conditions.  Knowledge Deficits related to short term plan for care coordination needs and long term plans for chronic disease management needs Nurse Case Manager Clinical Goal(s):  patient will work with care management team to address care coordination and chronic disease management needs related to Disease Management   Interventions:  Evaluation of current treatment plan related to hyperlipidemia and patient's adherence to plan as established by provider. Reviewed medications and updated Epic with changes Discussed recent appointments and future appointments with Neurosurgeon, Pain Management and Rheumatology consult Provided information on managing pain Discussed plans with patient for ongoing care management follow up and provided patient with direct contact information for care management team Provided therapeutic listening Provided number to [facility] [phone] for patient to inquire about member benefits (free phone and [employer] membership for patient's son) Self Care Activities:  Patient will self administer medications as prescribed Patient will attend all scheduled provider appointments Patient will call pharmacy for medication refills Patient will call provider office for new concerns or questions Patient will call and schedule PCP appointment and eye  exam Patient Goals: - contact [employer] [phone] for member benefits (free phone, youth membership to [facility] for your son) - call to cancel if needed - attend all scheduled appointments, schedule a follow up with PCP - keep a calendar with prescription refill dates - keep a calendar with appointment dates - schedule recommended health tests (blood work, mammogram, colonoscopy, pap test) - schedule and keep appointment for annual check-up - schedule eye exam - increase intake of fruits and vegetables - work with MM Pharmacist, Ovid Curd for medication management  - work with MM LCSW, [place]  Follow Up Plan: Telephone follow up appointment with care management team member scheduled for: 04/05/21 @ 1pm     Follow Up:  Patient agrees to Care Plan and Follow-up.  Plan: The Managed Medicaid care management team will reach out to the patient again over the next 30 days.  Date/time of next scheduled RN care management/care coordination outreach:  04/05/21 @ 1pm  Lurena Joiner RN, BSN [place]  [employer] manager

## 2021-03-03 DIAGNOSIS — M4802 Spinal stenosis, cervical region: Secondary | ICD-10-CM | POA: Diagnosis not present

## 2021-03-09 ENCOUNTER — Other Ambulatory Visit: Payer: Self-pay | Admitting: Neurosurgery

## 2021-03-09 DIAGNOSIS — M5416 Radiculopathy, lumbar region: Secondary | ICD-10-CM | POA: Diagnosis not present

## 2021-03-09 DIAGNOSIS — Z981 Arthrodesis status: Secondary | ICD-10-CM | POA: Diagnosis not present

## 2021-03-09 DIAGNOSIS — M5441 Lumbago with sciatica, right side: Secondary | ICD-10-CM | POA: Diagnosis not present

## 2021-03-09 DIAGNOSIS — G8929 Other chronic pain: Secondary | ICD-10-CM

## 2021-03-09 DIAGNOSIS — M4802 Spinal stenosis, cervical region: Secondary | ICD-10-CM | POA: Diagnosis not present

## 2021-03-12 ENCOUNTER — Other Ambulatory Visit: Payer: Self-pay | Admitting: Licensed Clinical Social Worker

## 2021-03-12 NOTE — Patient Instructions (Signed)
Visit Information  [name] was given information about Medicaid Managed Care team care coordination services as a part of their [place] Medicaid benefit. [name] verbally consented to engagement with the [facility] Managed Care team.   If you are experiencing a medical emergency, please call 911 or report to your local emergency department or urgent care.   If you have a non-emergency medical problem during routine business hours, please contact your provider's office and ask to speak with a nurse.   For questions related to your [facility], please call: [phone] or visit the homepage here: [email or web address]  If you would like to schedule transportation through your [facility], please call the following number at least 2 days in advance of your appointment: [phone].   Call the [place] at [phone], at any time, 24 hours a day, 7 days a week. If you are in danger or need immediate medical attention call 911.  If you would like help to quit smoking, call 1-800-QUIT-NOW 575 068 1442) OR Espaol: 1-855-Djelo-Ya (3-299-242-6834) o para ms informacin haga clic aqu or Text READY to 200-400 to register via text  [name] - following are the goals we discussed in your visit today:   Goals Addressed             This Visit's Progress    Track and Manage My Symptoms-Depression       Timeframe:  Long-Range Goal Priority:  High Start Date:   12/24/20                          Expected End Date: ongoing                     Follow Up Date 05/10/21   - avoid negative self-talk - develop a personal safety plan - develop a plan to deal with triggers like holidays, anniversaries - exercise at least 2 to 3 times per week - have a plan for how to handle bad days - journal feelings and what helps to feel better or worse - spend  time or talk with others at least 2 to 3 times per week - spend time or talk with others every day - watch for early signs of feeling worse - write in journal every day    Why is this important?   Keeping track of your progress will help your treatment team find the right mix of medicine and therapy for you.  Write in your journal every day.  Day-to-day changes in depression symptoms are normal. It may be more helpful to check your progress at the end of each week instead of every day.     Notes:         [name], BSW, MSW, [employer] Managed Medicaid LCSW [email or web address] Phone: [phone]

## 2021-03-12 NOTE — Patient Outreach (Signed)
Medicaid Managed Page Social Work Note  03/12/2021 Name:  [name] MRN:  [id number] DOB:  January 19, 1976  [name] is an 45 y.o. year old female who is Page primary patient of Marcia Haw, MD.  The Medicaid Managed Page Coordination team was consulted for assistance with:  [name] and Resources  [name] was given information about Medicaid Managed Page Coordination team services today. [name] Patient agreed to services and verbal consent obtained.  Engaged with patient  for by telephone forfollow up visit in response to referral for case management and/or Page coordination services.   Assessments/Interventions:  Review of past medical history, allergies, medications, health status, including review of consultants reports, laboratory and other test data, was performed as part of comprehensive evaluation and provision of chronic Page management services.  SDOH: (Social Determinant of Health) assessments and interventions performed: SDOH Interventions    Flowsheet Row Most Recent Value  SDOH Interventions   Stress Interventions Provide Counseling       Advanced Directives Status:  See Page Plan for related entries.  Page Plan                 Allergies  Allergen Reactions   Latex Itching    Positive Skin Testing at allergist.   Hydrocodone-Acetaminophen Itching and Nausea And Vomiting    And dizziness    Medications Reviewed Today     Reviewed by Marcia Montane, RN (Registered Nurse) on 03/02/21 at 1330  Med List Status: <None>   Medication Order Taking? Sig Documenting Provider Last Dose Status Informant  amLODipine (NORVASC) 5 MG tablet [id number] Yes TAKE 1 TABLET (5 MG TOTAL) BY MOUTH DAILY. Marcia Oliphant, MD Taking Active   atorvastatin (LIPITOR) 80 MG tablet [id number] Yes TAKE 1 TABLET BY MOUTH DAILY. Marcia Haw, MD Taking Active   busPIRone (BUSPAR) 15 MG tablet [id number] Yes Take 15 mg by mouth 3 (three) times daily as needed. [provider] Taking Active   diclofenac Sodium (VOLTAREN) 1 % GEL [id number] No Apply 2 g topically 4 (four) times daily.  Patient not taking: Reported on 03/02/2021   Marcia Floro, DO Not Taking Active            Med Note ([name]   Tue Mar 02, 2021  1:26 PM) Needs refill  ezetimibe (ZETIA) 10 MG tablet [id number] Yes Take 1 tablet (10 mg total) by mouth daily. Marcia Oliphant, MD Taking Active   gabapentin (NEURONTIN) 600 MG tablet [id number] Yes Take 1.5 tablets (900 mg total) by mouth 3 (three) times daily. Marcia Haw, MD Taking Active   HYDROcodone-acetaminophen (NORCO/VICODIN) 5-325 MG tablet [id number] Yes Take by mouth. [provider] Taking Active            Med Note ([name]   Tue Mar 02, 2021  1:17 PM) Taking 1.5tabs twice daily  hydrOXYzine (ATARAX/VISTARIL) 25 MG tablet [id number] Yes Take 25 mg by mouth 3 (three) times daily as needed for anxiety. [provider] Taking Active Self           Med Note ([name]   Tue Dec 01, 2020  9:35 AM) Taking for hives related to anxiety  Lurasidone HCl (LATUDA) 60 MG TABS [id number] No   Patient not taking: No sig reported   [provider] Not Taking Active   Multiple Vitamin (MULTIVITAMIN WITH MINERALS) TABS tablet [id number] Yes Take 1 tablet by mouth daily at 2 PM.  Women's Multivitamin [provider] Taking Active Self  oxyCODONE-acetaminophen (PERCOCET/ROXICET) 5-325 MG tablet [id number] No Take 1-2 tablets by mouth every 4 (four) hours as needed for moderate pain or severe pain.  Patient not taking: No sig reported   Marcia Pies, MD Not Taking Active   sertraline (ZOLOFT) 100 MG tablet [id number] Yes Take 100 mg by mouth every evening. [provider] Taking Active Self  tiZANidine (ZANAFLEX) 4 MG tablet [id number] Yes Take 4 mg by mouth every 6 (six) hours as needed for muscle spasms. [provider] Taking Active   topiramate (TOPAMAX) 25 MG tablet  [id number] Yes Take 25 mg by mouth in the morning and at bedtime. [provider] Taking Active Self           Med Note ([name]   Thu Jan 28, 2021  3:26 PM)    traZODone (DESYREL) 100 MG tablet [id number] Yes Take 100 mg by mouth at bedtime as needed for sleep. [provider] Taking Active Self            Patient Active Problem List   Diagnosis Date Noted   Fibromyalgia 12/02/2020   Cervical spondylosis with myelopathy and radiculopathy 10/23/2020   Left shoulder pain 08/13/2020   Muscle spasticity 08/11/2020   BMI 34.0-34.9,adult 10/23/2019   Anxiety 10/08/2019   Healthcare maintenance 10/08/2019   Proteinuria 02/21/2018   Arthralgia of multiple joints 02/21/2018   Major depression, recurrent ([name]) 10/31/2017   HTN (hypertension) 10/31/2017   Incontinence in female 10/31/2017   Mixed hyperlipidemia 10/31/2017   PTSD (post-traumatic stress disorder) 10/30/2017    Conditions to be addressed/monitored per PCP order:  Anxiety and Depression  Page Plan : [name]  Updates made by Marcia Cutter, Marcia since 03/12/2021 12:00 AM     Problem: Anxiety Identification (Anxiety)      Long-Range Goal: Anxiety Symptoms Identified   Start Date: 12/24/2020  Priority: High  Note:   Timeframe:  Long-Range Goal Priority:  High Start Date:   12/24/20                          Expected End Date: ongoing                      Follow Up Date- 05/10/21  Current barriers:   Chronic Mental Health needs related to anxiety, bipolar, and depression Limited social support, ADL IADL limitations, and Mental Health Concerns  Needs Support, Education, and Page Coordination in order to meet unmet mental health needs. Clinical Goal(s): patient will work with [name] to improve her overall mental health needs. Patient as PTSD, bipolar, depression and anxiety.  Patient will implement healthy coping skills into her routine over the next 90 days. Clinical Interventions:   Assessed patient's previous and current treatment, coping skills, support system and barriers to Page  Review various resources, discussed options and provided patient information about Department of Social Services (food stamps, [place], and utilities assistance) Depression screen reviewed , Solution-Focused Strategies, Mindfulness or Psychologist, educational, Active listening / Reflection utilized , Emotional Supportive Provided, Behavioral Activation, [place] , Brief CBT , Suicidal Ideation/Homicidal Ideation assessed:, and PHQ2/ PHQ9 completed ; Patient interviewed and appropriate assessments performed Discussed plans with patient for ongoing Page management follow up and provided patient with direct contact information for Page management team Assisted patient/caregiver with obtaining information about health plan benefits Provided education and assistance to client  regarding Advanced Directives. Discussed several options for long term counseling based on need and insurance.  Assisted patient with narrowing the options down to [facility] ) Patient wishes to consider this referral over the next 90 days again as she is too overwhelmed with her current life and keeping up with appointments to add another new provider at this time. [facility] Marcia will follow up regarding this concern on 05/10/21.  Patient will continue therapy and medication management services at [facility]. However, patient is upset that she continues to get new providers because the turn over rate for employees there is high. Patient sees Beverly Sessions virtually monthly.  Patient wishes to apply for disability as she has several health conditions that prevent her from being able to work. [facility] Marcia provided patient with information on how to apply for services. Patient wishes to apply in person rather than over the phone or online. [place] Marcia sent patient Page text message on 01/25/21 with the [employer] office address, number and their hours. [facility] Marcia also provided patient with the toll free number in case she wishes to apply for disability by phone. Patient was very appreciative of this resource education and information. Inter-disciplinary Page team collaboration (see longitudinal plan of Page) Patient Goals/Self-Page Activities: Over the next 120 days - barriers to treatment adherence identified - complementary therapy use encouraged - counseling provided - depression screen reviewed - emotional liability acknowledged and normalized - full diagnostic interview performed - immediate suicide evaluation arranged - medication side effects managed - participation in mental health treatment encouraged - response to pharmacologic therapy monitored - self-awareness of emotional triggers encouraged - avoid negative self-talk - develop Page personal safety plan - develop Page plan to deal with triggers like holidays, anniversaries - exercise at least 2 to 3 times per week - have Page plan for how to handle bad days - journal feelings and what helps to feel better or worse - spend time or talk with others at least 2 to 3 times per week - spend time or talk with others every day - watch for early signs of feeling worse - begin personal counseling - call and visit an old friend - check out volunteer opportunities - join Page support group - laugh; watch Page funny movie or comedian - learn and use visualization or guided imagery - perform Page random act of kindness - practice relaxation or meditation daily - start or continue Page personal journal - talk about feelings with Page friend, family or spiritual advisor - practice positive thinking and self-talk Call your insurance provider for more information about your Enhanced Benefits  Continue with therapy Continue with compliance of taking medication   Depression screen [facility] 2/9 01/25/2021 12/24/2020 12/02/2020 08/07/2020 02/07/2020  Decreased  Interest 1 1 1 3 2   Down, Depressed, Hopeless 1 1 1 3 2   PHQ - 2 Score 2 2 2 6 4   Altered sleeping 2 3 3 3 3   Tired, decreased energy 3 3 3 3 3   Change in appetite 2 3 3 3 3   Feeling bad or failure about yourself  2 2 3  0 0  Trouble concentrating 1 1 3  0 3  Moving slowly or fidgety/restless 0 0 0 0 3  Suicidal thoughts 0 0 0 0 0  PHQ-9 Score 12 14 17 15 19   Difficult doing work/chores Somewhat difficult Very difficult Extremely dIfficult Extremely dIfficult -  Some recent data might be hidden  Follow up:  Patient agrees to Page Plan and Follow-up.  Plan: The Managed Medicaid Page management team will reach out to the patient again over the next 90 days.  Datetime of next scheduled Social Work Page management/Page coordination outreach:  05/10/21  [name], BSW, MSW, Marcia Managed Medicaid [email or web address] Phone: [phone]

## 2021-03-16 ENCOUNTER — Other Ambulatory Visit: Payer: Self-pay

## 2021-03-22 NOTE — Patient Outreach (Signed)
Medicaid Managed Care Pharmacy Note  03/16/2021 Name:  [name] MRN:  [id number] DOB:  11-11-1975  [name] is an 45 y.o. year old female who is a primary patient of Lattie Haw, MD.  The [facility] Managed Care Coordination team was consulted for assistance with disease management and care coordination needs.    Engaged with patient by telephone for follow up visit in response to referral for case management and/or care coordination services.  [name] was given information about Medicaid Managed Care Coordination team services today. [name] Patient agreed to services and verbal consent obtained.  Objective:  Lab Results  Component Value Date   CREATININE 0.63 10/21/2020   CREATININE 0.69 10/07/2019   CREATININE 0.62 01/16/2019    Lab Results  Component Value Date   HGBA1C 5.6 12/02/2020       Component Value Date/Time   CHOL 172 12/02/2020 1520   TRIG 433 (H) 12/02/2020 1520   HDL 24 (L) 12/02/2020 1520   CHOLHDL 7.2 (H) 12/02/2020 1520   CHOLHDL 6.4 (H) 07/02/2015 0900   VLDL 44 (H) 07/02/2015 0900   LDLCALC 78 12/02/2020 1520   BP Readings from Last 3 Encounters:  02/26/21 126/86  12/02/20 136/86  10/24/20 106/65    Care Plan  Allergies  Allergen Reactions   Latex Itching    Positive Skin Testing at allergist.   Hydrocodone-Acetaminophen Itching and Nausea And Vomiting    And dizziness    Medications Reviewed Today     Reviewed by Hughes Better, RPH-CPP (Pharmacist) on 03/21/21 at [place] List Status: <None>   Medication Order Taking? Sig Documenting Provider Last Dose Status Informant  amLODipine (NORVASC) 5 MG tablet [id number] Yes TAKE 1 TABLET (5 MG TOTAL) BY MOUTH DAILY. Wilber Oliphant, MD Taking Active   atorvastatin (LIPITOR) 80 MG tablet [id number] Yes TAKE 1 TABLET BY MOUTH DAILY. Lattie Haw, MD Taking Active   busPIRone (BUSPAR) 15 MG tablet [id number] Yes Take 15 mg by mouth 3 (three) times daily as needed. [provider] Taking Active            Med Note Georgina Peer, Clarivel Callaway   Tue Mar 16, 2021  1:05 PM) Usually just 1-2x/day  diclofenac Sodium (VOLTAREN) 1 % GEL [id number] No Apply 2 g topically 4 (four) times daily.  Patient not taking: No sig reported   [name], DO Not Taking Active            Med Note (ROBB, MELANIE A   Tue Mar 02, 2021  1:26 PM) Needs refill  ezetimibe (ZETIA) 10 MG tablet [id number] Yes Take 1 tablet (10 mg total) by mouth daily. Wilber Oliphant, MD Taking Active   gabapentin (NEURONTIN) 600 MG tablet [id number] Yes Take 1.5 tablets (900 mg total) by mouth 3 (three) times daily. Lattie Haw, MD Taking Active   HYDROcodone-acetaminophen (NORCO/VICODIN) 5-325 MG tablet [id number] Yes Take by mouth. [provider] Taking Active            Med Note (ROBB, MELANIE A   Tue Mar 02, 2021  1:17 PM) Taking 1.5tabs twice daily  hydrOXYzine (ATARAX/VISTARIL) 25 MG tablet [id number] Yes Take 25 mg by mouth 3 (three) times daily as needed for anxiety. [provider] Taking Active Self           Med Note (ROBB, MELANIE A   Tue Dec 01, 2020  9:35 AM) Taking for hives related to anxiety  loratadine (CLARITIN) 10 MG  tablet [id number] Yes Take 10 mg by mouth daily. [provider]  Active Self  Lurasidone HCl (LATUDA) 60 MG TABS [id number] No   Patient not taking: No sig reported   [provider] Not Taking Active   Multiple Vitamin (MULTIVITAMIN WITH MINERALS) TABS tablet [id number] Yes Take 1 tablet by mouth daily at 2 PM. Women's Multivitamin [provider] Taking Active Self  sertraline (ZOLOFT) 100 MG tablet [id number] Yes Take 100 mg by mouth every evening. [provider] Taking Active Self  tiZANidine (ZANAFLEX) 4 MG tablet [id number] Yes Take 4 mg by mouth every 6 (six) hours as needed for muscle spasms. [provider] Taking Active   topiramate (TOPAMAX) 25 MG tablet [id number] Yes Take 25 mg by mouth in the morning  and at bedtime. [provider] Taking Active Self           Med Note (ROBB, MELANIE A   Thu Jan 28, 2021  3:26 PM)    traZODone (DESYREL) 100 MG tablet [id number] Yes Take 100 mg by mouth at bedtime as needed for sleep. [provider] Taking Active Self            Patient Active Problem List   Diagnosis Date Noted   Fibromyalgia 12/02/2020   Cervical spondylosis with myelopathy and radiculopathy 10/23/2020   Left shoulder pain 08/13/2020   Muscle spasticity 08/11/2020   BMI 34.0-34.9,adult 10/23/2019   Anxiety 10/08/2019   Healthcare maintenance 10/08/2019   Proteinuria 02/21/2018   Arthralgia of multiple joints 02/21/2018   Major depression, recurrent ([place]) 10/31/2017   HTN (hypertension) 10/31/2017   Incontinence in female 10/31/2017   Mixed hyperlipidemia 10/31/2017   PTSD (post-traumatic stress disorder) 10/30/2017    Conditions to be addressed/monitored per PCP order:  Anxiety, Depression, and Bipolar Disorder  Care Plan : General Pharmacy (Adult)  Updates made by Hughes Better, RPH-CPP since 03/22/2021 12:00 AM     Problem: Chronic Disease Management   Priority: High     Long-Range Goal: Managing Chronic Disease Therapies   Start Date: 03/16/2021  Expected End Date: 06/19/2021  This Visit's Progress: On track  Priority: High  Note:   Current Barriers:  Unable to achieve control of BiPolar/Complex PTSD/Anxiety  Does not contact provider office for questions/concerns regarding BiPolar/Complex PTSD/Anxiety   Pharmacist Clinical Goal(s):  patient will achieve improvement in BiPolar/Complex PTSD/Anxiety symptoms as evidenced by improvement in PHQ9 contact provider office for questions/concerns as evidenced notation of same in electronic health record through collaboration with PharmD and provider.    Interventions: Inter-disciplinary care team collaboration (see longitudinal plan of care) Comprehensive medication review performed;  medication list updated in electronic medical record  BiPolar/Complex PTSD/Anxiety:  Uncontrolled; current treatment:Latuda 60mg , buspirone 15mg  three times a day as needed, hydroxyzine 25mg  three times a day as needed, Trazodone  100mg  as needed for sleep  PHQ9: 7  Patient reports difficulty with sleep but believes it is currently due to pain rather than anxiety/depression. Therefore she states she cannot properly assess  effectiveness of trazodone Patient does not feel buspirone is helping with her anxiety Self-discontinued Latuda several months ago reporting it gave her panic attacks  Patient working with Beverly Sessions for mental health support  Recommended patient follow-up with PCP office on regular basis until she feels she has better control of her symptoms  Patient Goals/Self-Care Activities Over the next 90 days, patient will:  - take medications as prescribed  Follow Up Plan: Telephone follow up appointment with care management team member  scheduled for: 04/05/21 with Nurse Case Manager and 05/10/21 with Social Worker      Hughes Better PharmD, CPP [place] Medicaid [place] [phone]

## 2021-03-22 NOTE — Patient Instructions (Signed)
Visit Information  [name] was given information about Medicaid Managed Care team care coordination services as a part of their [place] Medicaid benefit. [name] verbally consented to engagement with the [facility] Managed Care team.   If you are experiencing a medical emergency, please call 911 or report to your local emergency department or urgent care.   If you have a non-emergency medical problem during routine business hours, please contact your provider's office and ask to speak with a nurse.   For questions related to your [facility], please call: [phone] or visit the homepage here: [email or web address]  If you would like to schedule transportation through your [facility], please call the following number at least 2 days in advance of your appointment: [phone].   Call the [place] at [phone], at any time, 24 hours a day, 7 days a week. If you are in danger or need immediate medical attention call 911.  If you would like help to quit smoking, call 1-800-QUIT-NOW 360-162-4851) OR Espaol: 1-855-Djelo-Ya (1-017-510-2585) o para ms informacin haga clic aqu or Text READY to 200-400 to register via text  [name] - following are the goals we discussed in your visit today:   Goals Addressed   None     The patient verbalized understanding of instructions provided today and declined a print copy of patient instruction materials.   RN Care Manager will call patient on 04/05/21 Social Worker will 05/10/21.  Pharmacist will 06/22/21  Hughes Better PharmD, CPP High Risk Managed Medicaid [place] [phone]   Following is a copy of your plan of care:   Problem Identified: Chronic Disease Management   Priority: High     Long-Range Goal: Managing Chronic Disease Therapies   Start Date: 03/16/2021   Expected End Date: 06/19/2021  This Visit's Progress: On track  Priority: High  Note:   Current Barriers:  Unable to achieve control of BiPolar/Complex PTSD/Anxiety  Does not contact provider office for questions/concerns regarding BiPolar/Complex PTSD/Anxiety   Pharmacist Clinical Goal(s):  patient will achieve improvement in BiPolar/Complex PTSD/Anxiety symptoms as evidenced by improvement in PHQ9 contact provider office for questions/concerns as evidenced notation of same in electronic health record through collaboration with PharmD and provider.    Interventions: Inter-disciplinary care team collaboration (see longitudinal plan of care) Comprehensive medication review performed; medication list updated in electronic medical record  BiPolar/Complex PTSD/Anxiety:  Uncontrolled; current treatment:Latuda 60mg , buspirone 15mg  three times a day as needed, hydroxyzine 25mg  three times a day as needed, Trazodone  100mg  as needed for sleep  PHQ9: 7  Patient reports difficulty with sleep but believes it is currently due to pain rather than anxiety/depression. Therefore she states she cannot properly assess  effectiveness of trazodone Patient does not feel buspirone is helping with her anxiety Self-discontinued Latuda several months ago reporting it gave her panic attacks  Patient working with Beverly Sessions for mental health support  Recommended patient follow-up with PCP office on regular basis until she feels she has better control of her symptoms  Patient Goals/Self-Care Activities Over the next 90 days, patient will:  - take medications as prescribed  Follow Up Plan: Telephone follow up appointment with care management team member scheduled for: 04/05/21 with Nurse Case Manager and 05/10/21 with Social Worker

## 2021-03-29 ENCOUNTER — Ambulatory Visit
Admission: RE | Admit: 2021-03-29 | Discharge: 2021-03-29 | Disposition: A | Discharge status: Home / Self Care | Payer: Medicaid Other | Source: Ambulatory Visit | Attending: Neurosurgery | Admitting: Neurosurgery

## 2021-03-29 DIAGNOSIS — M545 Low back pain, unspecified: Secondary | ICD-10-CM | POA: Diagnosis not present

## 2021-03-29 DIAGNOSIS — G8929 Other chronic pain: Secondary | ICD-10-CM

## 2021-03-29 DIAGNOSIS — M5441 Lumbago with sciatica, right side: Secondary | ICD-10-CM

## 2021-03-29 DIAGNOSIS — M48061 Spinal stenosis, lumbar region without neurogenic claudication: Secondary | ICD-10-CM | POA: Diagnosis not present

## 2021-03-29 NOTE — Progress Notes (Signed)
Office Visit Note  Patient: [name]             Date of Birth: 02-27-1976           MRN: [id number]             PCP: Lattie Haw, MD Referring: McDiarmid, Blane Ohara, MD Visit Date: 03/30/2021  Subjective:  No chief complaint on file.   History of Present Illness: [name] is a 45 y.o. female here for chronic polyarthralgias with history of fibromyalgia and skin rashes.  She reports history of similar generalized joint pains going back about 20 years.  She had right knee repair for dislocation with jogging otherwise no major arthritis or joint injuries back in that time.  However she subsequently developed increased problems with cervical and lumbar spine disease with chronic back pain.  This been treated with multiple medications including treatments for inflammation, spasticity, and neuropathic pain.  Since the birth of her last child she had recurrent gynecological problems requiring several procedures and surgery had increased symptoms flareup after undergoing these.  Previous autoimmune labs in 2019 were checked due to substantial increase in generalized joint pain symptoms also development of urticarial lesions with dermatographism some and at least 1 episode of anaphylaxis.  Work-up at the time was negative for RF, CCP, ANA and normal inflammatory markers.  Evaluation in allergy clinic suggestive for latex allergy but no other specific underlying process identified.  She is an ongoing patient with pain management clinic and neurosurgery.  She underwent cervical spine surgery in May since then developed worsening again of itching and skin rashes in multiple sites off and on that has somewhat improved as time goes on after her procedure.  On her extremities she mostly has small round lesions with residual hyperpigmentation and areas of excoriation.  She also has some irregular hyperpigmented patches on her forearms bilaterally.   Activities of Daily Living:  Patient reports morning  stiffness for all day.  Patient Reports nocturnal pain.  Difficulty dressing/grooming: Denies Difficulty climbing stairs: Reports Difficulty getting out of chair: Reports Difficulty using hands for taps, buttons, cutlery, and/or writing: Reports  Review of Systems  Constitutional:  Positive for fatigue.  HENT:  Negative for mouth sores, mouth dryness and nose dryness.   Eyes:  Negative for pain, itching and dryness.  Respiratory:  Negative for shortness of breath and difficulty breathing.   Cardiovascular:  Negative for chest pain and palpitations.  Gastrointestinal:  Negative for blood in stool, constipation and diarrhea.  Endocrine: Negative for increased urination.  Genitourinary:  Negative for difficulty urinating.  Musculoskeletal:  Positive for joint pain, joint pain, myalgias, morning stiffness, muscle tenderness and myalgias. Negative for joint swelling.  Skin:  Positive for rash. Negative for color change and redness.  Allergic/Immunologic: Negative for susceptible to infections.  Neurological:  Positive for dizziness, numbness, headaches, memory loss and weakness.  Hematological:  Positive for bruising/bleeding tendency.  Psychiatric/Behavioral:  Positive for confusion.    PMFS History:  Patient Active Problem List   Diagnosis Date Noted   Status post cervical spinal fusion 03/30/2021   Chronic low back pain 03/30/2021   Rash and other nonspecific skin eruption 03/30/2021   Fibromyalgia 12/02/2020   Cervical spondylosis with myelopathy and radiculopathy 10/23/2020   Left shoulder pain 08/13/2020   Muscle spasticity 08/11/2020   BMI 34.0-34.9,adult 10/23/2019   Anxiety 10/08/2019   Healthcare maintenance 10/08/2019   Proteinuria 02/21/2018   Arthralgia of multiple joints 02/21/2018  Major depression, recurrent ([place]) 10/31/2017   HTN (hypertension) 10/31/2017   Incontinence in female 10/31/2017   Mixed hyperlipidemia 10/31/2017   PTSD (post-traumatic stress  disorder) 10/30/2017    Past Medical History:  Diagnosis Date   Abnormal uterine bleeding (AUB) 01/21/2014   Adenomyosis 01/21/2014   Anemia    Anxiety    no meds   Depression    no meds   Encounter for female sterilization procedure 12/06/2011   Endometrioma of ovary 08/04/2015   Fibroids    Fibromyalgia    no meds   History of preterm labor    History of recurrent UTI (urinary tract infection)    Intramural leiomyoma of uterus 01/21/2014   Mild preeclampsia 09/20/2011   Hix with 2013 pregnancy  PIH - resolved, no longer an issue   Obese    Pelvic pain in female 01/21/2014   Polyhydramnios 09/20/2011   S/P cesarean section 09/20/2011   S/P tubal ligation 12/07/2011   SVD (spontaneous vaginal delivery)    x 2   Uterine fibroids affecting pregnancy    Vaginal delivery 1995, 1998    Family History  Problem Relation Age of Onset   Cancer Mother        breast   Hypertension Mother    Breast cancer Mother    Heart disease Father    Hypertension Father    Mental illness Sister        bipolar, schizophrenia,    Ovarian cysts Sister    Allergic rhinitis Sister    Asthma Sister    Seizures Sister    Ovarian cysts Sister    Diabetes Maternal Grandmother    Depression Maternal Grandfather        suicide   Healthy Son    Healthy Son    Healthy Son    Past Surgical History:  Procedure Laterality Date   ANTERIOR CERVICAL DECOMP/DISCECTOMY FUSION N/A 10/23/2020   Procedure: CERVICAL FIVE-SIX, CERVICAL SIX-SEVEN ANTERIOR CERVICAL DECOMPRESSION/DISCECTOMY FUSION;  Surgeon: Earnie Larsson, MD;  Location: [facility];  Service: Neurosurgery;  Laterality: N/A;   CESAREAN SECTION  09/20/2011   Procedure: CESAREAN SECTION;  Surgeon: Thornell Sartorius, MD;  Location: [facility];  Service: Gynecology;  Laterality: N/A;   CHOLECYSTECTOMY     Lap   KNEE SURGERY     right - dislocated knee cap   LAPAROSCOPIC ASSISTED VAGINAL HYSTERECTOMY N/A 01/22/2014   Procedure: LAPAROSCOPIC ASSISTED VAGINAL HYSTERECTOMY, Bilateral  Salpingectomy;  Surgeon: Janyth Contes, MD;  Location: [facility];  Service: Gynecology;  Laterality: N/A;   LAPAROSCOPIC LYSIS OF ADHESIONS  08/05/2015   Procedure: LAPAROSCOPIC LYSIS OF ADHESIONS;  Surgeon: Janyth Contes, MD;  Location: [facility];  Service: Gynecology;;   LAPAROSCOPIC OVARIAN CYSTECTOMY  08/05/2015   Procedure: LAPAROSCOPY;  Surgeon: Janyth Contes, MD;  Location: [facility];  Service: Gynecology;;   LAPAROSCOPIC TUBAL LIGATION  12/07/2011   Procedure: LAPAROSCOPIC TUBAL LIGATION;  Surgeon: Thornell Sartorius, MD;  Location: [facility];  Service: Gynecology;  Laterality: Bilateral;   OOPHORECTOMY Right 08/05/2015   Procedure: OOPHORECTOMY;  Surgeon: Janyth Contes, MD;  Location: [facility];  Service: Gynecology;  Laterality: Right;   Social History   Social History Narrative   Not on file   Immunization History  Administered Date(s) Administered   Influenza,inj,Quad PF,6+ Mos 07/02/2015, 02/21/2018, 02/26/2021   PFIZER(Purple Top)SARS-COV-2 Vaccination 02/07/2020, 02/28/2020   Pneumococcal Polysaccharide-23 01/23/2014   Tdap 09/21/2011     Objective: Vital Signs: BP 120/83 (BP Location: Right Arm, Patient Position: Sitting, Cuff Size:  Normal)   Pulse 73   Ht 5\' 8"  (1.727 m)   Wt 219 lb 6.4 oz (99.5 kg)   LMP 01/08/2014   BMI 33.36 kg/m    Physical Exam Constitutional:      Appearance: She is obese.  Eyes:     Comments: Left eye conjunctival injection, upper periorbital swelling  Cardiovascular:     Rate and Rhythm: Normal rate and regular rhythm.  Pulmonary:     Effort: Pulmonary effort is normal.     Breath sounds: Normal breath sounds.  Skin:    General: Skin is warm and dry.     Comments: Mild central facial erythema with telangiectasias present no papules Round flat hyperpigmented or faintly erythematous macules on shins bilaterally mostly anterior Irregular patches of hyperpigmentation on extensor surfaces of forearms and hands  Neurological:      Mental Status: She is alert.  Psychiatric:        Mood and Affect: Mood normal.     Musculoskeletal Exam:  Neck well healed anterior surgical scar, slightly decreased ROM with stiffness Shoulders full ROM tenderness to upper back and extending slightly down upper arms with overhead abduction Elbows full ROM no tenderness or swelling Wrists full ROM no tenderness or swelling Fingers full ROM no tenderness or swelling Paraspinal muscle tenderness to palpation at multiple levels Knees full ROM, no swelling, patellofemoral crepitus worse on right knee Ankles full ROM no tenderness or swelling   Investigation: No additional findings.  Imaging: No results found.  Recent Labs: Lab Results  Component Value Date   WBC 15.5 (H) 10/21/2020   HGB 14.6 10/21/2020   PLT 284 10/21/2020   NA 137 10/21/2020   K 3.6 10/21/2020   CL 107 10/21/2020   CO2 23 10/21/2020   GLUCOSE 98 10/21/2020   BUN 8 10/21/2020   CREATININE 0.63 10/21/2020   BILITOT 0.2 10/07/2019   ALKPHOS 83 10/07/2019   AST 20 10/07/2019   ALT 27 10/07/2019   PROT 7.5 10/07/2019   ALBUMIN 4.8 10/07/2019   CALCIUM 9.2 10/21/2020   GFRAA 122 10/07/2019    Speciality Comments: No specialty comments available.  Procedures:  No procedures performed Allergies: Latex   Assessment / Plan:     Visit Diagnoses: Rash and other nonspecific skin eruption - Plan: ANA, Sedimentation rate, C3 and C4, ANCA Screen Reflex Titer(QUEST)  No particular inflammatory skin rashes noted on exam today, she has excoriated areas worst on distal legs, distal arms, and upper back with relative sparing of rest of trunk. 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 Not sure about the patches of hyperpigmentation on her forearms. Will repeat some inflammatory serology today also checking serum complements and ANCA antibodies considering the rest of her history. If positive will follow up but lower pretest suspicion so if nothing shows  up and allergy workup was not highly productive before, could consider dermatology for further recommendations..  Fibromyalgia  Symptoms are consistent with fibromyalgia syndrome with chronic fatigue, brain fog or cognitive difficulty, and generalized pain with sometimes allodynia.  She reports sleep disturbance attributed to pain about once nightly.  She does have chronic headache no new exacerbation.  She has some constipation but thinks this is related to pain medication. She is currently taking tizanadine, topamax, sertraline, and trazodone for management of the various symptoms. I do not recommend additional treatment such as SNRI or TCA at the moment. We discussed the concept of pain sensitization can occur alongside other arthritis issues. Recommended her  to review online [facility] [employer] site for additional self management advice.  Arthralgia of multiple joints Status post cervical spinal fusion  Joint pain in multiple sites worst involved is her back and neck has ongoing management plan for this.  Recent cervical spine surgery still has a lot of residual stiffness and surrounding muscular groups.  She had recent lumbar spine MRI yesterday radiology report not yet available.  Proteinuria, unspecified type - Plan: ANA, Sedimentation rate, C3 and C4, ANCA Screen Reflex Titer(QUEST)  History of proteinuria without significant chronic kidney disease or known underlying pathology.  We will also check serum complements and ANCA screening.  Orders: Orders Placed This Encounter  Procedures   ANA   Sedimentation rate   C3 and C4   ANCA Screen Reflex Titer(QUEST)    No orders of the defined types were placed in this encounter.    Follow-Up Instructions: No follow-ups on file.   Collier Salina, MD  Note - This record has been created using [employer].  Chart creation errors have been sought, but may not always  have been located. Such creation errors do not reflect on  the  standard of medical care.

## 2021-03-30 ENCOUNTER — Other Ambulatory Visit: Payer: Self-pay

## 2021-03-30 ENCOUNTER — Ambulatory Visit: Payer: Medicaid Other | Admitting: Internal Medicine

## 2021-03-30 ENCOUNTER — Encounter: Payer: Self-pay | Admitting: Internal Medicine

## 2021-03-30 VITALS — BP 120/83 | HR 73 | Ht 68.0 in | Wt 219.4 lb

## 2021-03-30 DIAGNOSIS — R21 Rash and other nonspecific skin eruption: Secondary | ICD-10-CM | POA: Diagnosis not present

## 2021-03-30 DIAGNOSIS — M545 Low back pain, unspecified: Secondary | ICD-10-CM | POA: Insufficient documentation

## 2021-03-30 DIAGNOSIS — Z981 Arthrodesis status: Secondary | ICD-10-CM

## 2021-03-30 DIAGNOSIS — M797 Fibromyalgia: Secondary | ICD-10-CM | POA: Diagnosis not present

## 2021-03-30 DIAGNOSIS — R809 Proteinuria, unspecified: Secondary | ICD-10-CM | POA: Diagnosis not present

## 2021-03-30 DIAGNOSIS — M2550 Pain in unspecified joint: Secondary | ICD-10-CM | POA: Diagnosis not present

## 2021-03-30 DIAGNOSIS — G8929 Other chronic pain: Secondary | ICD-10-CM | POA: Insufficient documentation

## 2021-03-30 NOTE — Patient Instructions (Signed)
I recommend checking out the [facility] patient-centered guide for fibromyalgia and chronic pain management: [email or web address]  Complement Assay Test Why am I having this test? Complement refers to a group of proteins that are part of the body's disease-fighting system (immune system). A complement assay test provides information about some or all of these proteins. You may have this test: To diagnose a lack, or deficiency, of certain complement proteins. Deficiencies can be passed from parent to child (inherited). To monitor an infection or autoimmune disease. If you have unexplained inflammation or swelling (edema). If you have bacterial infections again and again. What is being tested? This test can be used to measure: Total complement. This is the total number of protein complements in your blood. The number of each kind of complement in your blood. The nine main kinds of complement are labeled C1 through C9. Some of these complements, such as C3 and C4, are especially important and have many functions in the body. Depending on why you are having the test, your health care provider may test your total complement or only some individual complements, such as C3 and C4. The total complement assay test may be done before individual complements are tested. What kind of sample is taken? A blood sample is required for this test. It is usually collected by inserting a needle into a blood vessel. Tell a health care provider about: Any allergies you have. All medicines you are taking, including vitamins, herbs, eye drops, creams, and over-the-counter medicines. Any blood disorders you have. Any surgeries you have had. Any medical conditions you have. Whether you are pregnant or may be pregnant. How are the results reported? Your results will be reported as a value that tells you how much complement is in your blood. This will be given as units per milliliter of blood (units/mL)  or as milligrams per deciliter of blood (mg/dL). Your results may be reported as total complement, or as individual complements, or both. Your health care provider will compare your results to normal ranges that were established after testing a large group of people (reference ranges). Reference ranges may vary among labs and hospitals. For this test, reference ranges for some of the most commonly measured complement assays may be: Total complement: 30-75 units/mL. C2: 1-4 mg/dL. C3: 75-175 mg/dL. C4: 22-45 units/mL. What do the results mean? Results within reference ranges are considered normal, which means you have a normal amount of complement in your blood. Results that are higher than the reference ranges may be caused by: Inflammatory disease. Heart attack. Cancer. Complement deficiencies, or results lower than the reference ranges, may be caused by: Certain inherited conditions. Autoimmune disease. Certain liver diseases. Malnutrition. Certain types of anemia that result in breakdown of red blood cells (hemolytic anemia). Talk with your health care provider about what your results mean. Questions to ask your health care provider Ask your health care provider, or the department that is doing the test: When will my results be ready? How will I get my results? What are my treatment options? What other tests do I need? What are my next steps? Summary Complement refers to a group of proteins that are part of the body's disease-fighting system (immune system). A complement assay test can provide information about some or all of these proteins. You may have a complement assay test to help diagnose a complement deficiency, and to monitor some infections or autoimmune disease. Talk with your health care provider about what your results mean.  Erythrocyte  Sedimentation Rate Test Why am I having this test? The erythrocyte sedimentation rate (ESR) test is used to help find illnesses  related to: Sudden (acute) or long-term (chronic) infections. Inflammation. The body's disease-fighting system attacking healthy cells (autoimmune diseases). Cancer. Tissue death. If you have symptoms that may be related to any of these illnesses, your health care provider may do an ESR test before doing more specific tests. If you have an inflammatory immune disease, such as rheumatoid arthritis, you may have this test to help monitor your therapy. What is being tested? This test measures how long it takes for your red blood cells (erythrocytes) to settle in a solution over a certain amount of time (sedimentation rate). When you have an infection or inflammation, your red blood cells clump together and settle faster. The sedimentation rate provides information about how much inflammation is present in the body. What kind of sample is taken? A blood sample is required for this test. It is usually collected by inserting a needle into a blood vessel. How do I prepare for this test? Follow any instructions from your health care provider about changing or stopping your regular medicines. Tell a health care provider about: Any allergies you have. All medicines you are taking, including vitamins, herbs, eye drops, creams, and over-the-counter medicines. Any blood disorders you have. Any surgeries you have had. Any medical conditions you have, such as thyroid or kidney disease. Whether you are pregnant or may be pregnant. How are the results reported? Your results will be reported as a value that measures sedimentation rate in millimeters per hour (mm/hr). Your health care provider will compare your results to normal ranges that were established after testing a large group of people (reference values). Reference values may vary among labs and hospitals. For this test, common reference values, which vary by age and gender, are: Newborn: 0-2 mm/hr. Child, up to puberty: 0-10 mm/hr. Female: Under 50  years: 0-20 mm/hr. 50-85 years: 0-30 mm/hr. Over 85 years: 0-42 mm/hr. Female: Under 50 years: 0-15 mm/hr. 50-85 years: 0-20 mm/hr. Over 85 years: 0-30 mm/hr. Certain conditions or medicines may cause ESR levels to be falsely lower or higher, such as: Pregnancy. Obesity. Steroids, birth control pills, and blood thinners. Thyroid or kidney disease. What do the results mean? Results that are within reference values are considered normal, meaning that the level of inflammation in your body is healthy. High ESR levels mean that there is inflammation in your body. You will have more tests to help make a diagnosis. Inflammation may result from many different conditions or injuries. Talk with your health care provider about what your results mean. Questions to ask your health care provider Ask your health care provider, or the department that is doing the test: When will my results be ready? How will I get my results? What are my treatment options? What other tests do I need? What are my next steps? Summary The erythrocyte sedimentation rate (ESR) test is used to help find illnesses associated with sudden (acute) or long-term (chronic) infections, inflammation, autoimmune diseases, cancer, or tissue death. If you have symptoms that may be related to any of these illnesses, your health care provider may do an ESR test before doing more specific tests. If you have an inflammatory immune disease, such as rheumatoid arthritis, you may have this test to help monitor your therapy. This test measures how long it takes for your red blood cells (erythrocytes) to settle in a solution over a certain amount of  time (sedimentation rate). This provides information about how much inflammation is present in the body.

## 2021-04-01 LAB — ANA: Anti Nuclear Antibody (ANA): NEGATIVE

## 2021-04-01 LAB — C3 AND C4
C3 Complement: 160 mg/dL (ref 83–193)
C4 Complement: 22 mg/dL (ref 15–57)

## 2021-04-01 LAB — SEDIMENTATION RATE: Sed Rate: 2 mm/h (ref 0–20)

## 2021-04-01 LAB — ANCA SCREEN W REFLEX TITER: ANCA Screen: NEGATIVE

## 2021-04-05 ENCOUNTER — Other Ambulatory Visit: Payer: Self-pay | Admitting: *Deleted

## 2021-04-05 NOTE — Patient Instructions (Signed)
Visit Information  [name]  - as a part of your Medicaid benefit, you are eligible for care management and care coordination services at no cost or copay. I was unable to reach you by phone today but would be happy to help you with your health related needs. Please feel free to call me @ [phone].   A member of the Managed Medicaid care management team will reach out to you again over the next 14 days.   Lurena Joiner RN, BSN [place]  [employer] manager

## 2021-04-05 NOTE — Patient Outreach (Signed)
Care Coordination  04/05/2021  [name] June 24, 1975 [id number]   Medicaid Managed Care   Unsuccessful Outreach Note  04/05/2021 Name: [name] MRN: [id number] DOB: Oct 19, 1975  Referred by: Lattie Haw, MD Reason for referral : High Risk Managed Medicaid (Unsuccessful RNCM follow up outreach)   An unsuccessful telephone outreach was attempted today. The patient was referred to the case management team for assistance with care management and care coordination.   Follow Up Plan: A HIPAA compliant phone message was left for the patient providing contact information and requesting a return call.   Lurena Joiner RN, BSN [place]  [employer] manager

## 2021-04-20 ENCOUNTER — Other Ambulatory Visit: Payer: Self-pay

## 2021-04-20 ENCOUNTER — Other Ambulatory Visit: Payer: Self-pay | Admitting: *Deleted

## 2021-04-20 NOTE — Patient Outreach (Signed)
Care Coordination  04/20/2021  [name] 1975-12-16 [id number]  Successful outreach with [name] today. She is anxious and concerned re: abnormal MRI results. She has an appointment 04/21/21 to discuss results with Neurology and would like to reconnect with [facility] after this appointment. RNCM advised patient to journal any questions or concerns and take this list with her, also recommended to take her sister for support. [name] voiced understanding. A new appointment was scheduled for 04/22/21 @ 9am.  Marcia Joiner RN, BSN [place] RN Care Coordinator

## 2021-04-21 DIAGNOSIS — M5441 Lumbago with sciatica, right side: Secondary | ICD-10-CM | POA: Diagnosis not present

## 2021-04-21 DIAGNOSIS — I10 Essential (primary) hypertension: Secondary | ICD-10-CM | POA: Diagnosis not present

## 2021-04-21 DIAGNOSIS — Z981 Arthrodesis status: Secondary | ICD-10-CM | POA: Diagnosis not present

## 2021-04-21 DIAGNOSIS — Z6832 Body mass index (BMI) 32.0-32.9, adult: Secondary | ICD-10-CM | POA: Diagnosis not present

## 2021-04-21 DIAGNOSIS — G894 Chronic pain syndrome: Secondary | ICD-10-CM | POA: Diagnosis not present

## 2021-04-21 DIAGNOSIS — M5416 Radiculopathy, lumbar region: Secondary | ICD-10-CM | POA: Diagnosis not present

## 2021-04-22 ENCOUNTER — Other Ambulatory Visit: Payer: Self-pay

## 2021-04-22 ENCOUNTER — Other Ambulatory Visit: Payer: Self-pay | Admitting: *Deleted

## 2021-04-22 NOTE — Patient Instructions (Signed)
Visit Information  [name] was given information about Medicaid Managed Care team care coordination services as a part of their [place] Medicaid benefit. [name] verbally consented to engagement with the [facility] Managed Care team.   If you are experiencing a medical emergency, please call 911 or report to your local emergency department or urgent care.   If you have a non-emergency medical problem during routine business hours, please contact your provider's office and ask to speak with a nurse.   For questions related to your [facility], please call: [phone] or visit the homepage here: [email or web address]  If you would like to schedule transportation through your [facility], please call the following number at least 2 days in advance of your appointment: [phone].   Call the [place] at [phone], at any time, 24 hours a day, 7 days a week. If you are in danger or need immediate medical attention call 911.  If you would like help to quit smoking, call 1-800-QUIT-NOW 4162352780) OR Espaol: 1-855-Djelo-Ya (6-384-536-4680) o para ms informacin haga clic aqu or Text READY to 200-400 to register via text  [name] - following are the goals we discussed in your visit today:   Goals Addressed             This Visit's Progress    Make and Keep All Appointments       Timeframe:  Long-Range Goal Priority:  High Start Date:   12/01/20                          Expected End Date:  05/27/21                     Follow Up Date 05/27/2021    - attend all scheduled appointments, schedule a follow up with PCP - schedule an eye exam - call to cancel if needed - keep a calendar with prescription refill dates - keep a calendar with appointment dates    Why is this important?   Part of staying healthy is seeing the doctor for  follow-up care.  If you forget your appointments, there are some things you can do to stay on track.         Protect My Health       Timeframe:  Long-Range Goal Priority:  High Start Date:   12/01/20                          Expected End Date: 05/27/21                      Follow Up Date 05/27/21    - contact [place] [phone] for member benefits (free phone, fresh fruit and vegetables) - schedule recommended health tests (blood work, mammogram, colonoscopy, pap test) - schedule and keep appointment for annual check-up - schedule eye exam - increase intake of fruits, vegetables and foods to help decrease arthritis pain - work with MM Pharmacist, for medication management  - work with MM LCSW, Jerene Pitch   Why is this important?   Screening tests can find diseases early when they are easier to treat.  Your doctor or nurse will talk with you about which tests are important for you.  Getting shots for common diseases like the flu and shingles will help prevent them.  Please see education materials related to high cholesterol and pain/arthritis provided by MyChart link.  [name] has access to MyChart and can view provided education  Telephone follow up appointment with Managed Medicaid care management team member scheduled for:  Lurena Joiner RN, BSN [place] RN Care Coordinator   Following is a copy of your plan of care:  Care Plan : General Plan of Care (Adult)  Updates made by Melissa Montane, RN since 04/22/2021 12:00 AM     Problem: Health Promotion or Disease Self-Management (General Plan of Care)      Long-Range Goal: Self-Management Plan Developed   Start Date: 12/01/2020  Expected End Date: 05/27/2021  Recent Progress: On track  Priority: High  Note:   Current Barriers:  Ineffective Self Health Maintenance-[name] is recovering from spinal surgery. She is managing her pain and activity. She understands she has to limit and  space out her activity. She has a 43 year old son at home and a sister that is very helpful. She is able to manage doing her chores around the home and errands like grocery shopping. She has been making adjustments to her diet and taking Lipitor to manage hyperlipidemia over the last year. She was unable to attend her follow up visit with the PCP due to having her spinal surgery. [name] called [facility] upset about her follow up with PCP. She has concerns regarding her care and would like to switch PCP's. [name] struggles with anxiety and PTSD, as well as multiple health issues prior to having back surgery. She needs an office close to home. [name] continues to have increased pain (ankles to toes, shoulders, neck and back). She has an appointment with Pain Management 8/17. She has not started Zetia due to concerns about side effects. [name] has a better understanding of Zetia and why she is taking it after speaking with MM Pharmacist. She has an upcoming consult with Rheumatology for joint pain and skin issues as recommended by Pain management. 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 Surgery is not indicated at this time. Dr. Davy Pique will provide pain management going forward.  Unable to independently manage hyperlipidemia Currently UNABLE TO independently self manage needs related to chronic health conditions.  Knowledge Deficits related to short term plan for care coordination needs and long term plans for chronic disease management needs Nurse Case Manager Clinical Goal(s):  [name] will work with care management team to address care coordination and chronic disease management needs related to Disease Management   Interventions:  Evaluation of current treatment plan related to hyperlipidemia and [name]'s adherence to plan as established by provider. Reviewed medications, Dr. Davy Pique will be providing pain  management Discussed recent appointments and advised to schedule a PCP appointment Provided information on foods to eat that can help decrease pain Provided education on managing arthritis pain Discussed plans with [name] for ongoing care management follow up and provided [name] with direct contact information for care management team Provided therapeutic listening Provided number to [facility] [phone] for [name] to inquire about member benefits (free phone and fresh fruits and vegetables) Self Care Activities:  [name] will self administer medications as prescribed [name] will attend all scheduled provider appointments [name] will call pharmacy for medication refills [name] will call provider office for new concerns or questions [name] will call and schedule PCP appointment and eye exam [name] Goals: - contact [employer] [phone] for member  benefits (free phone and fresh fruits and vegetables) - call to cancel if needed - attend all scheduled appointments, schedule a follow up with PCP - keep a calendar with prescription refill dates - keep a calendar with appointment dates - schedule recommended health tests (blood work, mammogram, colonoscopy, pap test) - schedule and keep appointment for annual check-up - schedule eye exam - increase intake of fruits, vegetables and foods to help decrease arthritis pain - work with MM Pharmacist, for medication management  - work with MM LCSW, Brooke  Follow Up Plan: Telephone follow up appointment with care management team member scheduled for: 05/27/21 @ 9am

## 2021-04-22 NOTE — Patient Outreach (Signed)
Medicaid Managed Care   Nurse Care Manager Note  04/22/2021 Name:  [name] MRN:  [id number] DOB:  24-Dec-1975  [name] is an 45 y.o. year old female who is a primary patient of Lattie Haw, MD.  [facility] Managed Care Coordination team was consulted for assistance with:    HLD Chronic pain  [name] was given information about Medicaid Managed Care Coordination team services today. [name] Patient agreed to services and verbal consent obtained.  Engaged with patient by telephone for follow up visit in response to provider referral for case management and/or care coordination services.   Assessments/Interventions:  Review of past medical history, allergies, medications, health status, including review of consultants reports, laboratory and other test data, was performed as part of comprehensive evaluation and provision of chronic care management services.  SDOH (Social Determinants of Health) assessments and interventions performed: SDOH Interventions    Flowsheet Row Most Recent Value  SDOH Interventions   Social Connections Interventions Patient Refused  Transportation Interventions Intervention Not Indicated       Care Plan  Allergies  Allergen Reactions   Latex Itching    Positive Skin Testing at allergist.    Medications Reviewed Today     Reviewed by Melissa Montane, RN (Registered Nurse) on 04/22/21 at [place] List Status: <None>   Medication Order Taking? Sig Documenting Provider Last Dose Status Informant  amLODipine (NORVASC) 5 MG tablet [id number] Yes TAKE 1 TABLET (5 MG TOTAL) BY MOUTH DAILY. Wilber Oliphant, MD Taking Active   atorvastatin (LIPITOR) 80 MG tablet [id number] Yes TAKE 1 TABLET BY MOUTH DAILY. Lattie Haw, MD Taking Active   busPIRone (BUSPAR) 15 MG tablet [id number] Yes Take 15 mg by mouth 3 (three) times daily as needed. [provider] Taking Active            Med Note Georgina Peer, RACHELLE   Tue Mar 16, 2021  1:05 PM)  Usually just 1-2x/day  diclofenac Sodium (VOLTAREN) 1 % GEL [id number] Yes Apply 2 g topically 4 (four) times daily. [name], DO Taking Active            Med Note Berenice Primas, [place] Mar 30, 2021  8:17 AM)    ezetimibe (ZETIA) 10 MG tablet [id number] Yes Take 1 tablet (10 mg total) by mouth daily. Wilber Oliphant, MD Taking Active   gabapentin (NEURONTIN) 600 MG tablet [id number] Yes Take 1.5 tablets (900 mg total) by mouth 3 (three) times daily. Lattie Haw, MD Taking Active   HYDROcodone-acetaminophen (NORCO/VICODIN) 5-325 MG tablet [id number] Yes Take by mouth. [provider] Taking Active            Med Note (Saundra Gin A   Tue Mar 02, 2021  1:17 PM) Taking 1.5tabs twice daily  hydrOXYzine (ATARAX/VISTARIL) 25 MG tablet [id number] Yes Take 25 mg by mouth 3 (three) times daily as needed for anxiety. [provider] Taking Active Self           Med Note (Liadan Guizar A   Tue Dec 01, 2020  9:35 AM) Taking for hives related to anxiety  loratadine (CLARITIN) 10 MG tablet [id number] Yes Take 10 mg by mouth daily. [provider] Taking Active Self  Lurasidone HCl (LATUDA) 60 MG TABS [id number] No   Patient not taking: No sig reported   [provider] Not Taking Active   Multiple Vitamin (MULTIVITAMIN WITH MINERALS) TABS tablet [id number] Yes Take 1  tablet by mouth daily at 2 PM. Women's Multivitamin [provider] Taking Active Self  sertraline (ZOLOFT) 100 MG tablet [id number] Yes Take 100 mg by mouth every evening. [provider] Taking Active Self  tiZANidine (ZANAFLEX) 4 MG tablet [id number] Yes Take 4 mg by mouth every 6 (six) hours as needed for muscle spasms. [provider] Taking Active   topiramate (TOPAMAX) 25 MG tablet [id number] Yes Take 25 mg by mouth in the morning and at bedtime. [provider] Taking Active Self           Med Note (Delenn Ahn A   Thu Jan 28, 2021  3:26 PM)    traZODone  (DESYREL) 100 MG tablet [id number] Yes Take 100 mg by mouth at bedtime as needed for sleep. [provider] Taking Active Self            Patient Active Problem List   Diagnosis Date Noted   Status post cervical spinal fusion 03/30/2021   Chronic low back pain 03/30/2021   Rash and other nonspecific skin eruption 03/30/2021   Fibromyalgia 12/02/2020   Cervical spondylosis with myelopathy and radiculopathy 10/23/2020   Left shoulder pain 08/13/2020   Muscle spasticity 08/11/2020   BMI 34.0-34.9,adult 10/23/2019   Anxiety 10/08/2019   Healthcare maintenance 10/08/2019   Proteinuria 02/21/2018   Arthralgia of multiple joints 02/21/2018   Major depression, recurrent ([place]) 10/31/2017   HTN (hypertension) 10/31/2017   Incontinence in female 10/31/2017   Mixed hyperlipidemia 10/31/2017   PTSD (post-traumatic stress disorder) 10/30/2017    Conditions to be addressed/monitored per PCP order:  HLD and chronic pain  Care Plan : General Plan of Care (Adult)  Updates made by Melissa Montane, RN since 04/22/2021 12:00 AM     Problem: Health Promotion or Disease Self-Management (General Plan of Care)      Long-Range Goal: Self-Management Plan Developed   Start Date: 12/01/2020  Expected End Date: 05/27/2021  Recent Progress: On track  Priority: High  Note:   Current Barriers:  Ineffective Self Health Maintenance-[name] is recovering from spinal surgery. She is managing her pain and activity. She understands she has to limit and space out her activity. She has a 26 year old son at home and a sister that is very helpful. She is able to manage doing her chores around the home and errands like grocery shopping. She has been making adjustments to her diet and taking Lipitor to manage hyperlipidemia over the last year. She was unable to attend her follow up visit with the PCP due to having her spinal surgery. Patient called [facility] upset about her follow up with PCP. She has concerns  regarding her care and would like to switch PCP's. Patient struggles with anxiety and PTSD, as well as multiple health issues prior to having back surgery. She needs an office close to home. Patient continues to have increased pain (ankles to toes, shoulders, neck and back). She has an appointment with Pain Management 8/17. She has not started Zetia due to concerns about side effects. Ms. Turvey has a better understanding of Zetia and why she is taking it after speaking with MM Pharmacist. She has an upcoming consult with Rheumatology for joint pain and skin issues as recommended by Pain management. She plans to schedule an appointment for mammogram.-Update-Ms. Zilberman had recent MRI which showed arthritis and degeneration in the spine. Surgery is not indicated at this time. Dr. Davy Pique will provide pain management going forward.  Unable to independently  manage hyperlipidemia Currently UNABLE TO independently self manage needs related to chronic health conditions.  Knowledge Deficits related to short term plan for care coordination needs and long term plans for chronic disease management needs Nurse Case Manager Clinical Goal(s):  patient will work with care management team to address care coordination and chronic disease management needs related to Disease Management   Interventions:  Evaluation of current treatment plan related to hyperlipidemia and patient's adherence to plan as established by provider. Reviewed medications, Dr. Davy Pique will be providing pain management Discussed recent appointments and advised to schedule a PCP appointment Provided information on foods to eat that can help decrease pain Provided education on managing arthritis pain Discussed plans with patient for ongoing care management follow up and provided patient with direct contact information for care management team Provided therapeutic listening Provided number to [facility] [phone] for patient to inquire about member benefits  (free phone and fresh fruits and vegetables) Self Care Activities:  Patient will self administer medications as prescribed Patient will attend all scheduled provider appointments Patient will call pharmacy for medication refills Patient will call provider office for new concerns or questions Patient will call and schedule PCP appointment and eye exam Patient Goals: - contact [place] [phone] for member benefits (free phone and fresh fruits and vegetables) - call to cancel if needed - attend all scheduled appointments, schedule a follow up with PCP - keep a calendar with prescription refill dates - keep a calendar with appointment dates - schedule recommended health tests (blood work, mammogram, colonoscopy, pap test) - schedule and keep appointment for annual check-up - schedule eye exam - increase intake of fruits, vegetables and foods to help decrease arthritis pain - work with MM Pharmacist, for medication management  - work with MM LCSW, Brooke  Follow Up Plan: Telephone follow up appointment with care management team member scheduled for: 05/27/21 @ 9am     Follow Up:  Patient agrees to Care Plan and Follow-up.  Plan: The Managed Medicaid care management team will reach out to the patient again over the next 30 days.  Date/time of next scheduled RN care management/care coordination outreach:  05/27/21 @ [place] RN, BSN [place]  [employer] manager

## 2021-05-10 ENCOUNTER — Telehealth: Payer: Self-pay | Admitting: Family Medicine

## 2021-05-10 ENCOUNTER — Other Ambulatory Visit: Payer: Self-pay

## 2021-05-10 NOTE — Patient Outreach (Signed)
[facility]) Care Management  05/10/2021  [name] 06-Jul-1975 [id number]  LCSW completed [facility] outreach attempt today during scheduled appointment time but was unable to reach patient successfully. [facility] LCSW made 7 unsuccessful attempts. A HIPPA compliant voice message was left encouraging patient to return call once available. Secure e mail was sent to [place] regarding this. LCSW will ask Scheduling Care Guide to reschedule [facility] SW appointment with patient.   [name], BSW, MSW, [employer] Managed Medicaid LCSW [email or web address] Phone: [phone]

## 2021-05-10 NOTE — Telephone Encounter (Signed)
..   Medicaid Managed Care   Unsuccessful Outreach Note  05/10/2021 Name: [name] MRN: [id number] DOB: 07/17/1975  Referred by: Lattie Haw, MD Reason for referral : High Risk Managed Medicaid (Attempted to reach this patient today to get her phone visit with the MM LCSW . The number does not ring and after a minute it just disconnects. )   An unsuccessful telephone outreach was attempted today. The patient was referred to the case management team for assistance with care management and care coordination.   Follow Up Plan: The care management team will reach out to the patient again over the next 7 days.   [place]

## 2021-05-10 NOTE — Patient Instructions (Signed)
[name] ,   The [facility] Managed Care Team is available to provide assistance to you with your healthcare needs at no cost and as a benefit of your [facility] Health plan. I'm sorry I was unable to reach you today for our scheduled appointment. Our care guide will call you to reschedule our telephone appointment. Please call me at the number below. I am available to be of assistance to you regarding your healthcare needs. .   Thank you,   [name], BSW, MSW, LCSW Managed Medicaid [email or web address] [email or web address] Phone: [phone]

## 2021-05-17 ENCOUNTER — Other Ambulatory Visit: Payer: Self-pay | Admitting: Family Medicine

## 2021-05-17 ENCOUNTER — Telehealth: Payer: Self-pay | Admitting: Family Medicine

## 2021-05-17 NOTE — Telephone Encounter (Signed)
..   Medicaid Managed Care   Unsuccessful Outreach Note  05/17/2021 Name: [name] MRN: [id number] DOB: 30-Nov-1975  Referred by: Lattie Haw, MD Reason for referral : High Risk Managed Medicaid (I called the patient to see if she wanted to reschedule her phone visit with the MM LCSW. I left my name and number on her VM.)   An unsuccessful telephone outreach was attempted today. The patient was referred to the case management team for assistance with care management and care coordination.   Follow Up Plan: The care management team will reach out to the patient again over the next 7 days.   [place]

## 2021-05-26 DIAGNOSIS — Z6832 Body mass index (BMI) 32.0-32.9, adult: Secondary | ICD-10-CM | POA: Diagnosis not present

## 2021-05-26 DIAGNOSIS — I10 Essential (primary) hypertension: Secondary | ICD-10-CM | POA: Diagnosis not present

## 2021-05-26 DIAGNOSIS — M5416 Radiculopathy, lumbar region: Secondary | ICD-10-CM | POA: Diagnosis not present

## 2021-05-27 ENCOUNTER — Other Ambulatory Visit: Payer: Self-pay | Admitting: *Deleted

## 2021-05-27 ENCOUNTER — Ambulatory Visit: Payer: Self-pay

## 2021-05-27 ENCOUNTER — Other Ambulatory Visit: Payer: Self-pay

## 2021-05-27 NOTE — Patient Outreach (Signed)
Medicaid Managed Care   Nurse Care Manager Note  05/27/2021 Name:  [name] MRN:  [id number] DOB:  01-24-1976  [name] is an 45 y.o. year old female who is a primary patient of Lattie Haw, MD.  [facility] Managed Care Coordination team was consulted for assistance with:    HLD Chronic pain  [name] was given information about Medicaid Managed Care Coordination team services today. [name] Patient agreed to services and verbal consent obtained.  Engaged with patient by telephone for follow up visit in response to provider referral for case management and/or care coordination services.   Assessments/Interventions:  Review of past medical history, allergies, medications, health status, including review of consultants reports, laboratory and other test data, was performed as part of comprehensive evaluation and provision of chronic care management services.  SDOH (Social Determinants of Health) assessments and interventions performed: SDOH Interventions    Flowsheet Row Most Recent Value  SDOH Interventions   Food Insecurity Interventions Intervention Not Indicated  Housing Interventions Intervention Not Indicated       Care Plan  Allergies  Allergen Reactions   Latex Itching    Positive Skin Testing at allergist.    Medications Reviewed Today     Reviewed by Melissa Montane, RN (Registered Nurse) on 05/27/21 at [phone]  Med List Status: <None>   Medication Order Taking? Sig Documenting Provider Last Dose Status Informant  amLODipine (NORVASC) 5 MG tablet [id number] Yes TAKE 1 TABLET (5 MG TOTAL) BY MOUTH DAILY. Wilber Oliphant, MD Taking Active   atorvastatin (LIPITOR) 80 MG tablet [id number] Yes TAKE 1 TABLET BY MOUTH DAILY. Lattie Haw, MD Taking Active   busPIRone (BUSPAR) 15 MG tablet [id number] Yes Take 15 mg by mouth 3 (three) times daily as needed. [provider] Taking Active            Med Note Georgina Peer, RACHELLE   Tue Mar 16, 2021  1:05 PM)  Usually just 1-2x/day  diclofenac Sodium (VOLTAREN) 1 % GEL [id number] Yes Apply 2 g topically 4 (four) times daily. [name], DO Taking Active            Med Note Berenice Primas, [place] Mar 30, 2021  8:17 AM)    ezetimibe (ZETIA) 10 MG tablet [id number] Yes Take 1 tablet (10 mg total) by mouth daily. Wilber Oliphant, MD Taking Active   gabapentin (NEURONTIN) 600 MG tablet [id number] Yes TAKE 1 AND 1/2 TABLETS BY MOUTH 3 TIMES A DAY Simmons-Robinson, Makiera, MD Taking Active   HYDROcodone-acetaminophen (NORCO/VICODIN) 5-325 MG tablet [id number] Yes Take by mouth. [provider] Taking Active            Med Note (Abed Schar A   Tue Mar 02, 2021  1:17 PM) Taking 1.5tabs twice daily  hydrOXYzine (ATARAX/VISTARIL) 25 MG tablet [id number] No Take 25 mg by mouth 3 (three) times daily as needed for anxiety.  Patient not taking: Reported on 05/27/2021   [provider] Not Taking Active Self           Med Note (Teyana Pierron A   Thu May 27, 2021  9:36 AM) Needs refill  loratadine (CLARITIN) 10 MG tablet [id number] Yes Take 10 mg by mouth daily. [provider] Taking Active Self  Lurasidone HCl (LATUDA) 60 MG TABS [id number] No   Patient not taking: Reported on 01/28/2021   [provider] Not Taking Active   Multiple Vitamin (MULTIVITAMIN WITH MINERALS)  TABS tablet [id number] Yes Take 1 tablet by mouth daily at 2 PM. Women's Multivitamin [provider] Taking Active Self  sertraline (ZOLOFT) 100 MG tablet [id number] Yes Take 100 mg by mouth every evening. [provider] Taking Active Self  tiZANidine (ZANAFLEX) 4 MG tablet [id number] Yes Take 4 mg by mouth every 6 (six) hours as needed for muscle spasms. [provider] Taking Active   topiramate (TOPAMAX) 25 MG tablet [id number] Yes Take 25 mg by mouth in the morning and at bedtime. [provider] Taking Active Self           Med Note (Abhiraj Dozal A   Thu Jan 28, 2021   3:26 PM)    traZODone (DESYREL) 100 MG tablet [id number] No Take 100 mg by mouth at bedtime as needed for sleep.  Patient not taking: Reported on 05/27/2021   [provider] Not Taking Active Self           Med Note Thamas Jaegers, Shatika Grinnell A   Thu May 27, 2021  9:37 AM) Needs refill            Patient Active Problem List   Diagnosis Date Noted   Status post cervical spinal fusion 03/30/2021   Chronic low back pain 03/30/2021   Rash and other nonspecific skin eruption 03/30/2021   Fibromyalgia 12/02/2020   Cervical spondylosis with myelopathy and radiculopathy 10/23/2020   Left shoulder pain 08/13/2020   Muscle spasticity 08/11/2020   BMI 34.0-34.9,adult 10/23/2019   Anxiety 10/08/2019   Healthcare maintenance 10/08/2019   Proteinuria 02/21/2018   Arthralgia of multiple joints 02/21/2018   Major depression, recurrent ([place]) 10/31/2017   HTN (hypertension) 10/31/2017   Incontinence in female 10/31/2017   Mixed hyperlipidemia 10/31/2017   PTSD (post-traumatic stress disorder) 10/30/2017    Conditions to be addressed/monitored per PCP order:  HLD and chronic pain  Care Plan : General Plan of Care (Adult)  Updates made by Melissa Montane, RN since 05/27/2021 12:00 AM  Completed 05/27/2021   Problem: Health Promotion or Disease Self-Management (General Plan of Care) Resolved 05/27/2021  Note:   Resolving due to duplicate goal     Long-Range Goal: Self-Management Plan Developed Completed 05/27/2021  Start Date: 12/01/2020  Expected End Date: 05/27/2021  Recent Progress: On track  Priority: High  Note:   Current Barriers:  Ineffective Self Health Maintenance-[name] is recovering from spinal surgery. She is managing her pain and activity. She understands she has to limit and space out her activity. She has a 4 year old son at home and a sister that is very helpful. She is able to manage doing her chores around the home and errands like grocery shopping. She has been making  adjustments to her diet and taking Lipitor to manage hyperlipidemia over the last year. She was unable to attend her follow up visit with the PCP due to having her spinal surgery. Patient called [facility] upset about her follow up with PCP. She has concerns regarding her care and would like to switch PCP's. Patient struggles with anxiety and PTSD, as well as multiple health issues prior to having back surgery. She needs an office close to home. Patient continues to have increased pain (ankles to toes, shoulders, neck and back). She has an appointment with Pain Management 8/17. She has not started Zetia due to concerns about side effects. [name] has a better understanding of Zetia and why she is taking it after speaking with [facility] Pharmacist. She has an  upcoming consult with Rheumatology for joint pain and skin issues as recommended by Pain management. 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 Surgery is not indicated at this time. Dr. Davy Pique will provide pain management going forward.  Unable to independently manage hyperlipidemia Currently UNABLE TO independently self manage needs related to chronic health conditions.  Knowledge Deficits related to short term plan for care coordination needs and long term plans for chronic disease management needs Resolving due to duplicate goal Nurse Case Manager Clinical Goal(s):  patient will work with care management team to address care coordination and chronic disease management needs related to Disease Management   Interventions:  Evaluation of current treatment plan related to hyperlipidemia and patient's adherence to plan as established by provider. Reviewed medications, Dr. Davy Pique will be providing pain management Discussed recent appointments and advised to schedule a PCP appointment Provided information on foods to eat that can help decrease pain Provided education on  managing arthritis pain Discussed plans with patient for ongoing care management follow up and provided patient with direct contact information for care management team Provided therapeutic listening Provided number to [facility] [phone] for patient to inquire about member benefits (free phone and fresh fruits and vegetables) Self Care Activities:  Patient will self administer medications as prescribed Patient will attend all scheduled provider appointments Patient will call pharmacy for medication refills Patient will call provider office for new concerns or questions Patient will call and schedule PCP appointment and eye exam Patient Goals: - contact [place] [phone] for member benefits (free phone and fresh fruits and vegetables) - call to cancel if needed - attend all scheduled appointments, schedule a follow up with PCP - keep a calendar with prescription refill dates - keep a calendar with appointment dates - schedule recommended health tests (blood work, mammogram, colonoscopy, pap test) - schedule and keep appointment for annual check-up - schedule eye exam - increase intake of fruits, vegetables and foods to help decrease arthritis pain - work with [facility] Pharmacist, for medication management  - work with [facility] LCSW, [place]  Follow Up Plan: Telephone follow up appointment with care management team member scheduled for: 05/27/21 @ [place] : [place] [facility]  Updates made by Melissa Montane, RN since 05/27/2021 12:00 AM     Problem: Chronic health management needs related to chronic pain and hyperlipidemia      Long-Range Goal: Development of Plan of Care to address health management needs related to chronic pain and hyperlipidemia   Start Date: 05/27/2021  Expected End Date: 08/25/2021  Priority: High  Note:   Current Barriers:  Chronic Disease Management support and education needs related to HLD and chronic pain [name] received MRI results showing  degenerative changes to her spinal cord. She is very emotional about this news. She was seen by Dr. Trenton Gammon yesterday and will start injections in January to help with pain and mobility. She has lost 20# on her own and is working on smoking cessation. RNCM Clinical Goal(s):  Patient will verbalize understanding of plan for management of HLD and chronic pain as evidenced by patient verbalization and self monitoring activities take all medications exactly as prescribed and will call provider for medication related questions as evidenced by documentation in EMR    attend all scheduled medical appointments: 12/13 @ [facility] with Gerrie Nordmann, 06/29/21 with Dr. Trenton Gammon for injection and schedule follow up  with PCP as evidenced by documentation in EMR        demonstrate improved adherence to prescribed treatment plan for HLD and chronic pain as evidenced by provider documentation in EMR work with pharmacist to address medication management related to HLD and chronic pain as evidenced by review of EMR and patient or pharmacist report    work with Education officer, museum to address [place] Concerns  related to the management of HLD and chronic pain as evidenced by review of EMR and patient or social worker report     through collaboration with Consulting civil engineer, provider, and care team.   Interventions: Inter-disciplinary care team collaboration (see longitudinal plan of care) Evaluation of current treatment plan related to  self management and patient's adherence to plan as established by provider Encouraged patient to do one thing each day for herself and focus of the positive Provided therapeutic listening Advised patient to exercise each day for muscle strength and mobility   Hyperlipidemia:  (Status: New goal.) Long Term Goal  Lab Results  Component Value Date   CHOL 172 12/02/2020   HDL 24 (L) 12/02/2020   LDLCALC 78 12/02/2020   TRIG 433 (H) 12/02/2020   CHOLHDL 7.2 (H) 12/02/2020     Medication  review performed; medication list updated in electronic medical record.  Provider established cholesterol goals reviewed; Counseled on importance of regular laboratory monitoring as prescribed; Provided HLD educational materials; Reviewed importance of limiting foods high in cholesterol; Reviewed exercise goals and target of 150 minutes per week; Assessed social determinant of health barriers;   Pain:  (Status: New goal.) Long Term Goal  Pain assessment performed Medications reviewed Reviewed provider established plan for pain management; Discussed importance of adherence to all scheduled medical appointments; Counseled on the importance of reporting any/all new or changed pain symptoms or management strategies to pain management provider; Advised patient to report to care team affect of pain on daily activities; Discussed use of relaxation techniques and/or diversional activities to assist with pain reduction (distraction, imagery, relaxation, massage, acupressure, TENS, heat, and cold application; Reviewed with patient prescribed pharmacological and nonpharmacological pain relief strategies; Assessed social determinant of health barriers;   Patient Goals/Self-Care Activities: Take medications as prescribed   Attend all scheduled provider appointments Call pharmacy for medication refills 3-7 days in advance of running out of medications Perform all self care activities independently  Perform IADL's (shopping, preparing meals, housekeeping, managing finances) independently Call provider office for new concerns or questions  Work with the social worker to address care coordination needs and will continue to work with the clinical team to address health care and disease management related needs - call for medicine refill 2 or 3 days before it runs out - take all medications exactly as prescribed - go to all doctor appointments as scheduled - adhere to prescribed diet: heart healthy -  develop an exercise routine       Follow Up:  Patient agrees to Care Plan and Follow-up.  Plan: The Managed Medicaid care management team will reach out to the patient again over the next 30 days.  Date/time of next scheduled RN care management/care coordination outreach:  06/30/21 @ [place] RN, [place] RN Care Coordinator

## 2021-05-27 NOTE — Patient Instructions (Addendum)
Visit Information  [name] was given information about Medicaid Managed Care team care coordination services as a part of their [place] Medicaid benefit. [name] verbally consented to engagement with the [facility] Managed Care team.   If you are experiencing a medical emergency, please call 911 or report to your local emergency department or urgent care.   If you have a non-emergency medical problem during routine business hours, please contact your provider's office and ask to speak with a nurse.   For questions related to your [facility], please call: [phone] or visit the homepage here: [email or web address]  If you would like to schedule transportation through your [facility], please call the following number at least 2 days in advance of your appointment: [phone].   Call the [place] at [phone], at any time, 24 hours a day, 7 days a week. If you are in danger or need immediate medical attention call 911.  If you would like help to quit smoking, call 1-800-QUIT-NOW 450-502-1904) OR Espaol: 1-855-Djelo-Ya (0-321-224-8250) o para ms informacin haga clic aqu or Text READY to 200-400 to register via text  [name] - following are the goals we discussed in your visit today:   Goals Addressed             This Visit's Progress    COMPLETED: Make and Keep All Appointments       Resolving due to duplicate goal  Timeframe:  Long-Range Goal Priority:  High Start Date:   12/01/20                          Expected End Date:  05/27/21                     Follow Up Date 05/27/2021    - attend all scheduled appointments, schedule a follow up with PCP - schedule an eye exam - call to cancel if needed - keep a calendar with prescription refill dates - keep a calendar with appointment dates    Why is this important?    Part of staying healthy is seeing the doctor for follow-up care.  If you forget your appointments, there are some things you can do to stay on track.         COMPLETED: Protect My Health       Resolving due to duplicate goal  Timeframe:  Long-Range Goal Priority:  High Start Date:   12/01/20                          Expected End Date: 05/27/21                      Follow Up Date 05/27/21    - contact [place] [phone] for member benefits (free phone, fresh fruit and vegetables) - schedule recommended health tests (blood work, mammogram, colonoscopy, pap test) - schedule and keep appointment for annual check-up - schedule eye exam - increase intake of fruits, vegetables and foods to help decrease arthritis pain - work with MM Pharmacist, for medication management  - work with MM LCSW, Jerene Pitch   Why is this important?   Screening tests can find diseases early when they are easier to treat.  Your doctor or nurse will talk with you about which tests are important for you.  Getting shots for common  diseases like the flu and shingles will help prevent them.             Please see education materials related to HLD and pain provided by MyChart link.  Patient has access to MyChart and can view provided education  Telephone follow up appointment with Managed Medicaid care management team member scheduled for:06/30/21 @ [place] RN, [place] RN Care Coordinator   Following is a copy of your plan of care:  Care Plan : General Plan of Care (Adult)  Updates made by Melissa Montane, RN since 05/27/2021 12:00 AM  Completed 05/27/2021   Problem: Health Promotion or Disease Self-Management (General Plan of Care) Resolved 05/27/2021  Note:   Resolving due to duplicate goal     Long-Range Goal: Self-Management Plan Developed Completed 05/27/2021  Start Date: 12/01/2020  Expected End Date: 05/27/2021  Recent Progress: On track  Priority: High   Note:   Current Barriers:  Ineffective Self Health Maintenance-[name] is recovering from spinal surgery. She is managing her pain and activity. She understands she has to limit and space out her activity. She has a 78 year old son at home and a sister that is very helpful. She is able to manage doing her chores around the home and errands like grocery shopping. She has been making adjustments to her diet and taking Lipitor to manage hyperlipidemia over the last year. She was unable to attend her follow up visit with the PCP due to having her spinal surgery. Patient called [facility] upset about her follow up with PCP. She has concerns regarding her care and would like to switch PCP's. Patient struggles with anxiety and PTSD, as well as multiple health issues prior to having back surgery. She needs an office close to home. Patient continues to have increased pain (ankles to toes, shoulders, neck and back). She has an appointment with Pain Management 8/17. She has not started Zetia due to concerns about side effects. [name] has a better understanding of Zetia and why she is taking it after speaking with MM Pharmacist. She has an upcoming consult with Rheumatology for joint pain and skin issues as recommended by Pain management. 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 Surgery is not indicated at this time. Dr. Davy Pique will provide pain management going forward.  Unable to independently manage hyperlipidemia Currently UNABLE TO independently self manage needs related to chronic health conditions.  Knowledge Deficits related to short term plan for care coordination needs and long term plans for chronic disease management needs Resolving due to duplicate goal Nurse Case Manager Clinical Goal(s):  patient will work with care management team to address care coordination and chronic disease management needs related to  Disease Management   Interventions:  Evaluation of current treatment plan related to hyperlipidemia and patient's adherence to plan as established by provider. Reviewed medications, Dr. Davy Pique will be providing pain management Discussed recent appointments and advised to schedule a PCP appointment Provided information on foods to eat that can help decrease pain Provided education on managing arthritis pain Discussed plans with patient for ongoing care management follow up and provided patient with direct contact information for care management team Provided therapeutic listening Provided number to [place] for patient to inquire about member benefits (free phone and fresh fruits and vegetables) Self Care Activities:  Patient will self administer medications as prescribed Patient will  attend all scheduled provider appointments Patient will call pharmacy for medication refills Patient will call provider office for new concerns or questions Patient will call and schedule PCP appointment and eye exam Patient Goals: - contact [employer] [phone] for member benefits (free phone and fresh fruits and vegetables) - call to cancel if needed - attend all scheduled appointments, schedule a follow up with PCP - keep a calendar with prescription refill dates - keep a calendar with appointment dates - schedule recommended health tests (blood work, mammogram, colonoscopy, pap test) - schedule and keep appointment for annual check-up - schedule eye exam - increase intake of fruits, vegetables and foods to help decrease arthritis pain - work with MM Pharmacist, for medication management  - work with MM LCSW, [place]  Follow Up Plan: Telephone follow up appointment with care management team member scheduled for: 05/27/21 @ [place] [facility] [place] of Care  Updates made by Melissa Montane, RN since 05/27/2021 12:00 AM     Problem: Chronic health management needs related to  chronic pain and hyperlipidemia      Long-Range Goal: Development of Plan of Care to address health management needs related to chronic pain and hyperlipidemia   Start Date: 05/27/2021  Expected End Date: 08/25/2021  Priority: High  Note:   Current Barriers:  Chronic Disease Management support and education needs related to HLD and chronic pain [name] received 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 She is very emotional about this news. She was seen by Dr. Trenton Gammon yesterday and will start injections in January to help with pain and mobility. She has lost 20# on her own and is working on smoking cessation. RNCM Clinical Goal(s):  Patient will verbalize understanding of plan for management of HLD and chronic pain as evidenced by patient verbalization and self monitoring activities take all medications exactly as prescribed and will call provider for medication related questions as evidenced by documentation in EMR    attend all scheduled medical appointments: 12/13 @ [facility] with Gerrie Nordmann, 06/29/21 with Dr. Trenton Gammon for injection and schedule follow up with PCP as evidenced by documentation in EMR        demonstrate improved adherence to prescribed treatment plan for HLD and chronic pain as evidenced by provider documentation in EMR work with pharmacist to address medication management related to HLD and chronic pain as evidenced by review of EMR and patient or pharmacist report    work with Education officer, museum to address Superior Concerns  related to the management of HLD and chronic pain as evidenced by review of EMR and patient or social worker report     through collaboration with Consulting civil engineer, provider, and care team.   Interventions: Inter-disciplinary care team collaboration (see longitudinal plan of care) Evaluation of current treatment plan related to  self management and patient's adherence to plan as established by provider Encouraged patient to do one thing each  day for herself and focus of the positive Provided therapeutic listening Advised patient to exercise each day for muscle strength and mobility   Hyperlipidemia:  (Status: New goal.) Long Term Goal  Lab Results  Component Value Date   CHOL 172 12/02/2020   HDL 24 (L) 12/02/2020   [place] 78 12/02/2020   TRIG 433 (H) 12/02/2020   CHOLHDL 7.2 (H) 12/02/2020     Medication review performed; medication list updated in electronic medical record.  Provider established cholesterol goals reviewed; Counseled on  importance of regular laboratory monitoring as prescribed; Provided HLD educational materials; Reviewed importance of limiting foods high in cholesterol; Reviewed exercise goals and target of 150 minutes per week; Assessed social determinant of health barriers;   Pain:  (Status: New goal.) Long Term Goal  Pain assessment performed Medications reviewed Reviewed provider established plan for pain management; Discussed importance of adherence to all scheduled medical appointments; Counseled on the importance of reporting any/all new or changed pain symptoms or management strategies to pain management provider; Advised patient to report to care team affect of pain on daily activities; Discussed use of relaxation techniques and/or diversional activities to assist with pain reduction (distraction, imagery, relaxation, massage, acupressure, TENS, heat, and cold application; Reviewed with patient prescribed pharmacological and nonpharmacological pain relief strategies; Assessed social determinant of health barriers;   Patient Goals/Self-Care Activities: Take medications as prescribed   Attend all scheduled provider appointments Call pharmacy for medication refills 3-7 days in advance of running out of medications Perform all self care activities independently  Perform IADL's (shopping, preparing meals, housekeeping, managing finances) independently Call provider office for new concerns  or questions  Work with the social worker to address care coordination needs and will continue to work with the clinical team to address health care and disease management related needs - call for medicine refill 2 or 3 days before it runs out - take all medications exactly as prescribed - go to all doctor appointments as scheduled - adhere to prescribed diet: heart healthy - develop an exercise routine      10 LITTLE Things To Do When You're Feeling Too Down To Do Anything  Take a shower. Even if you plan to stay in all day long and not see a soul, take a shower. It takes the most effort to hop in to the shower but once you do, you'll feel immediate results. It will wake you up and you'll be feeling much fresher (and cleaner too).  Brush and floss your teeth. Give your teeth a good brushing with a floss finish. It's a small task but it feels so good and you can check 'taking care of your health' off the list of things to do.  Do something small on your list. Most of [place] have some small thing we would like to get done (load of laundry, sew a button, email a friend). Doing one of these things will make you feel like you've accomplished something.  Drink water. Drinking water is easy right? It's also really beneficial for your health so keep a glass beside you all day and take sips often. It gives you energy and prevents you from boredom eating.  Do some floor exercises. The last thing you want to do is exercise but it might be just the thing you need the most. Keep it simple and do exercises that involve sitting or laying on the floor. Even the smallest of exercises release chemicals in the brain that make you feel good. Yoga stretches or core exercises are going to make you feel good with minimal effort.  Make your bed. Making your bed takes a few minutes but it's productive and you'll feel relieved when it's done. An unmade bed is a huge visual reminder that you're having an unproductive  day. Do it and consider it your housework for the day.  Put on some nice clothes. Take the sweatpants off even if you don't plan to go anywhere. Put on clothes that make you feel good. Take a look in  the mirror so your brain recognizes the sweatpants have been replaced with clothes that make you look great. It's an instant confidence booster.  Wash the dishes. A pile of dirty dishes in the sink is a reflection of your mood. It's possible that if you wash up the dishes, your mood will follow suit. It's worth a try.  Cook a real meal. If you have the luxury to have a "do nothing" day, you have time to make a real meal for yourself. Make a meal that you love to eat. The process is good to get you out of the funk and the food will ensure you have more energy for tomorrow.  Write out your thoughts by hand. When you hand write, you stimulate your brain to focus on the moment that you're in so make yourself comfortable and write whatever comes into your mind. Put those thoughts out on paper so they stop spinning around in your head. Those thoughts might be the very thing holding you down.

## 2021-05-28 ENCOUNTER — Other Ambulatory Visit: Payer: Self-pay | Admitting: Licensed Clinical Social Worker

## 2021-05-28 NOTE — Patient Outreach (Signed)
[facility]) Care Management  05/28/2021  [name] 1976/03/24 [id number]  Patient answered phone call for scheduled [facility] LCSW appointment on 05/28/21 but stated that she is in a crisis and needs to reschedule as she just found out that her cousin is in a coma and is on the other line with family and needs to remain in contact with them. Patient was given crisis support resources. [facility] LCSW also rescheduled patient's appointment for 06/09/21.  [name], BSW, MSW, [employer] Managed Medicaid LCSW [email or web address] Phone: [phone]

## 2021-05-28 NOTE — Patient Instructions (Signed)
[name] ,   The [facility] Managed Care Team is available to provide assistance to you with your healthcare needs at no cost and as a benefit of your [facility] Health plan.   Thank you,   [name], BSW, MSW, [employer] Managed Medicaid LCSW [email or web address] Phone: [phone]

## 2021-06-01 DIAGNOSIS — F4310 Post-traumatic stress disorder, unspecified: Secondary | ICD-10-CM | POA: Diagnosis not present

## 2021-06-01 DIAGNOSIS — F319 Bipolar disorder, unspecified: Secondary | ICD-10-CM | POA: Diagnosis not present

## 2021-06-01 DIAGNOSIS — F411 Generalized anxiety disorder: Secondary | ICD-10-CM | POA: Diagnosis not present

## 2021-06-09 ENCOUNTER — Other Ambulatory Visit: Payer: Self-pay | Admitting: Licensed Clinical Social Worker

## 2021-06-09 NOTE — Patient Instructions (Signed)
Visit Information  [name] was given information about Medicaid Managed Care team care coordination services as a part of their [place] Medicaid benefit. [name] verbally consented to engagement with the [facility] Managed Care team.   If you are experiencing a medical emergency, please call 911 or report to your local emergency department or urgent care.   If you have a non-emergency medical problem during routine business hours, please contact your provider's office and ask to speak with a nurse.   For questions related to your [facility], please call: [phone] or visit the homepage here: [email or web address]  If you would like to schedule transportation through your [facility], please call the following number at least 2 days in advance of your appointment: [phone].   Call the [place] at [phone], at any time, 24 hours a day, 7 days a week. If you are in danger or need immediate medical attention call 911.  If you would like help to quit smoking, call 1-800-QUIT-NOW 325-626-3989) OR Espaol: 1-855-Djelo-Ya (2-595-638-7564) o para ms informacin haga clic aqu or Text READY to 200-400 to register via text   Following is a copy of your plan of care:  Care Plan : LCSW Plan of Care  Updates made by Greg Cutter, LCSW since 06/09/2021 12:00 AM     Problem: Anxiety Identification (Anxiety)      Long-Range Goal: Anxiety Symptoms Identified   Start Date: 12/24/2020  Priority: High  Note:   Timeframe:  Long-Range Goal Priority:  High Start Date:   12/24/20                          Expected End Date: ongoing                      Follow Up Date- 07/08/21  Current barriers:   Chronic Mental Health needs related to anxiety, bipolar, and depression Limited social support, ADL IADL limitations, and Mental Health Concerns   Needs Support, Education, and Care Coordination in order to meet unmet mental health needs. Clinical Goal(s): patient will work with [facility] LCSW to improve her overall mental health needs. Patient as PTSD, bipolar, depression and anxiety.  Patient will implement healthy coping skills into her routine over the next 90 days. Patient Goals/Self-Care Activities: Over the next 120 days - barriers to treatment adherence identified - complementary therapy use encouraged - counseling provided - depression screen reviewed - emotional liability acknowledged and normalized - full diagnostic interview performed - immediate suicide evaluation arranged - medication side effects managed - participation in mental health treatment encouraged - response to pharmacologic therapy monitored - self-awareness of emotional triggers encouraged - avoid negative self-talk - develop a personal safety plan - develop a plan to deal with triggers like holidays, anniversaries - exercise at least 2 to 3 times per week - have a plan for how to handle bad days - journal feelings and what helps to feel better or worse - spend time or talk with others at least 2 to 3 times per week - spend time or talk with others every day - watch for early signs of feeling worse - begin personal counseling - call and visit an old friend - check out volunteer opportunities - join a support group - laugh; watch a funny movie or comedian - learn and use visualization or guided imagery - perform a random  act of kindness - practice relaxation or meditation daily - start or continue a personal journal - talk about feelings with a friend, family or spiritual advisor - practice positive thinking and self-talk Call your insurance provider for more information about your Enhanced Benefits  Continue with therapy Continue with compliance of taking medication   Depression screen [facility] 2/9 01/25/2021 12/24/2020 12/02/2020 08/07/2020 02/07/2020   Decreased Interest 1 1 1 3 2   Down, Depressed, Hopeless 1 1 1 3 2   PHQ - 2 Score 2 2 2 6 4   Altered sleeping 2 3 3 3 3   Tired, decreased energy 3 3 3 3 3   Change in appetite 2 3 3 3 3   Feeling bad or failure about yourself  2 2 3  0 0  Trouble concentrating 1 1 3  0 3  Moving slowly or fidgety/restless 0 0 0 0 3  Suicidal thoughts 0 0 0 0 0  PHQ-9 Score 12 14 17 15 19   Difficult doing work/chores Somewhat difficult Very difficult Extremely dIfficult Extremely dIfficult -  Some recent data might be hidden

## 2021-06-09 NOTE — Patient Outreach (Signed)
Medicaid Managed Care Social Work Note  06/09/2021 Name:  [name] MRN:  [id number] DOB:  [birth year]  [name] is an 45 y.o. year old female who is a primary patient of Marcia Haw, MD.  The Medicaid Managed Care Coordination team was consulted for assistance with:  [place] and Resources  [name] was given information about Medicaid Managed Care Coordination team services today. [name] Patient agreed to services and verbal consent obtained.  Engaged with patient  for by telephone forfollow up visit in response to referral for case management and/or care coordination services.   Assessments/Interventions:  Review of past medical history, allergies, medications, health status, including review of consultants reports, laboratory and other test data, was performed as part of comprehensive evaluation and provision of chronic care management services.  SDOH: (Social Determinant of Health) assessments and interventions performed: SDOH Interventions    Flowsheet Row Most Recent Value  SDOH Interventions   Stress Interventions Provide Counseling, Offered [employer] Resources       Advanced Directives Status:  See Care Plan for related entries.  Care Plan                 Allergies  Allergen Reactions   Latex Itching    Positive Skin Testing at allergist.    Medications Reviewed Today     Reviewed by Melissa Montane, RN (Registered Nurse) on 05/27/21 at [phone]  Med List Status: <None>   Medication Order Taking? Sig Documenting Provider Last Dose Status Informant  amLODipine (NORVASC) 5 MG tablet [id number] Yes TAKE 1 TABLET (5 MG TOTAL) BY MOUTH DAILY. Wilber Oliphant, MD Taking Active   atorvastatin (LIPITOR) 80 MG tablet [id number] Yes TAKE 1 TABLET BY MOUTH DAILY. Marcia Haw, MD Taking Active   busPIRone (BUSPAR) 15 MG tablet [id number] Yes Take 15 mg by mouth 3 (three) times daily as needed. [provider] Taking Active            Med  Note Georgina Peer, RACHELLE   Tue Mar 16, 2021  1:05 PM) Usually just 1-2x/day  diclofenac Sodium (VOLTAREN) 1 % GEL [id number] Yes Apply 2 g topically 4 (four) times daily. [name], DO Taking Active            Med Note Berenice Primas, [place] Mar 30, 2021  8:17 AM)    ezetimibe (ZETIA) 10 MG tablet [id number] Yes Take 1 tablet (10 mg total) by mouth daily. Wilber Oliphant, MD Taking Active   gabapentin (NEURONTIN) 600 MG tablet [id number] Yes TAKE 1 AND 1/2 TABLETS BY MOUTH 3 TIMES A DAY Simmons-Robinson, Makiera, MD Taking Active   HYDROcodone-acetaminophen (NORCO/VICODIN) 5-325 MG tablet [id number] Yes Take by mouth. [provider] Taking Active            Med Note (ROBB, MELANIE A   Tue Mar 02, 2021  1:17 PM) Taking 1.5tabs twice daily  hydrOXYzine (ATARAX/VISTARIL) 25 MG tablet [id number] No Take 25 mg by mouth 3 (three) times daily as needed for anxiety.  Patient not taking: Reported on 05/27/2021   [provider] Not Taking Active Self           Med Note (ROBB, MELANIE A   Thu May 27, 2021  9:36 AM) Needs refill  loratadine (CLARITIN) 10 MG tablet [id number] Yes Take 10 mg by mouth daily. [provider] Taking Active Self  Lurasidone HCl (LATUDA) 60 MG TABS [id number] No   Patient not  taking: Reported on 01/28/2021   [provider] Not Taking Active   Multiple Vitamin (MULTIVITAMIN WITH MINERALS) TABS tablet [id number] Yes Take 1 tablet by mouth daily at 2 PM. Women's Multivitamin [provider] Taking Active Self  sertraline (ZOLOFT) 100 MG tablet [id number] Yes Take 100 mg by mouth every evening. [provider] Taking Active Self  tiZANidine (ZANAFLEX) 4 MG tablet [id number] Yes Take 4 mg by mouth every 6 (six) hours as needed for muscle spasms. [provider] Taking Active   topiramate (TOPAMAX) 25 MG tablet [id number] Yes Take 25 mg by mouth in the morning and at bedtime. [provider] Taking Active Self            Med Note (ROBB, MELANIE A   Thu Jan 28, 2021  3:26 PM)    traZODone (DESYREL) 100 MG tablet [id number] No Take 100 mg by mouth at bedtime as needed for sleep.  Patient not taking: Reported on 05/27/2021   [provider] Not Taking Active Self           Med Note Thamas Jaegers, MELANIE A   Thu May 27, 2021  9:37 AM) Needs refill            Patient Active Problem List   Diagnosis Date Noted   Status post cervical spinal fusion 03/30/2021   Chronic low back pain 03/30/2021   Rash and other nonspecific skin eruption 03/30/2021   Fibromyalgia 12/02/2020   Cervical spondylosis with myelopathy and radiculopathy 10/23/2020   Left shoulder pain 08/13/2020   Muscle spasticity 08/11/2020   BMI 34.0-34.9,adult 10/23/2019   Anxiety 10/08/2019   Healthcare maintenance 10/08/2019   Proteinuria 02/21/2018   Arthralgia of multiple joints 02/21/2018   Major depression, recurrent ([place]) 10/31/2017   HTN (hypertension) 10/31/2017   Incontinence in female 10/31/2017   Mixed hyperlipidemia 10/31/2017   PTSD (post-traumatic stress disorder) 10/30/2017    Conditions to be addressed/monitored per PCP order:  [place] : LCSW Plan of Care  Updates made by Greg Cutter, LCSW since 06/09/2021 12:00 AM     Problem: Anxiety Identification (Anxiety)      Long-Range Goal: Anxiety Symptoms Identified   Start Date: 12/24/2020  Priority: High  Note:   Timeframe:  Long-Range Goal Priority:  High Start Date:   12/24/20                          Expected End Date: ongoing                      Follow Up Date- 07/08/21  Current barriers:   Chronic Mental Health needs related to anxiety, bipolar, and depression Limited social support, ADL IADL limitations, and Mental Health Concerns  Needs Support, Education, and Care Coordination in order to meet unmet mental health needs. Clinical Goal(s): patient will work with [facility] LCSW to improve her overall mental health needs. Patient as PTSD, bipolar,  depression and anxiety.  Patient will implement healthy coping skills into her routine over the next 90 days. Clinical Interventions:  Assessed patient's previous and current treatment, coping skills, support system and barriers to care  Review various resources, discussed options and provided patient information about Department of Social Services (food stamps, [place], and utilities assistance) Depression screen reviewed , Solution-Focused Strategies, Mindfulness or Psychologist, educational, Active listening / Reflection utilized , Emotional Supportive Provided, Behavioral Activation, Problem [place] , Brief CBT , Suicidal Ideation/Homicidal  Ideation assessed:, and PHQ2/ PHQ9 completed ; Patient interviewed and appropriate assessments performed Discussed plans with patient for ongoing care management follow up and provided patient with direct contact information for care management team Assisted patient/caregiver with obtaining information about health plan benefits Provided education and assistance to client regarding Advanced Directives. Discussed several options for long term counseling based on need and insurance.  Assisted patient with narrowing the options down to [facility] ) Patient wishes to consider this referral over the next 90 days again as she is too overwhelmed with her current life and keeping up with appointments to add another new provider at this time. [facility] LCSW will follow up regarding this concern on 05/10/21. Patient is agreeable to [facility] LCSW support.  Patient will continue therapy and medication management services at [facility]. However, patient is upset that she continues to get new providers because the turn over rate for employees there is high. Patient sees Beverly Sessions virtually monthly. Patient reports that she saw her psychiatrist recently.  Patient wishes to apply for disability as she has several health conditions that prevent her from  being able to work. [facility] LCSW provided patient with information on how to apply for services. Patient wishes to apply in person rather than over the phone or online. [facility] LCSW sent patient a text message on 01/25/21 with the [employer] office address, number and their hours. [facility] LCSW also provided patient with the toll free number in case she wishes to apply for disability by phone. Patient was very appreciative of this resource education and information. Update 06/09/21- Patient's close family member (cousin) has been in the ICU for 14 days under a coma but was able to get off the ventilator for some time and open her eyes which the family is very excited and thankful about. Family are staying strong together and remaining positive. Patient reports experiencing anxiety and insomnia since this event happen. LCSW provided education on relaxation techniques such as meditation, deep breathing, massage, grounding exercsies or yoga that can activate the body's relaxation response and ease symptoms of stress and anxiety. LCSW ask that when pt is struggling with difficult emotions and racing thoughts that they start this relaxation response process. LCSW provided extensive education on healthy coping skills for anxiety. SW used active and reflective listening, validated patient's feelings/concerns, and provided emotional support.Patient has been listening to gospel music to help ease her anxiety and stress.  LCSW provided education on healthy sleep hygiene and what that looks like. LCSW encouraged patient to implement a night time routine into his schedule that works best for him and that he is able to maintain. Advised patient to implement deep breathing/grounding/meditation/self-care exercises into his nightly routine to combat racing thoughts at night. LCSW encouraged patient to wake up at the same time each day, make his sleeping environment comfortable, exercise when able, to limit naps and to not eat  or drink anything right before bed.  Inter-disciplinary care team collaboration (see longitudinal plan of care) Patient Goals/Self-Care Activities: Over the next 120 days - barriers to treatment adherence identified - complementary therapy use encouraged - counseling provided - depression screen reviewed - emotional liability acknowledged and normalized - full diagnostic interview performed - immediate suicide evaluation arranged - medication side effects managed - participation in mental health treatment encouraged - response to pharmacologic therapy monitored - self-awareness of emotional triggers encouraged - avoid negative self-talk - develop a personal safety plan - develop a plan to deal with triggers  like holidays, anniversaries - exercise at least 2 to 3 times per week - have a plan for how to handle bad days - journal feelings and what helps to feel better or worse - spend time or talk with others at least 2 to 3 times per week - spend time or talk with others every day - watch for early signs of feeling worse - begin personal counseling - call and visit an old friend - check out volunteer opportunities - join a support group - laugh; watch a funny movie or comedian - learn and use visualization or guided imagery - perform a random act of kindness - practice relaxation or meditation daily - start or continue a personal journal - talk about feelings with a friend, family or spiritual advisor - practice positive thinking and self-talk Call your insurance provider for more information about your Enhanced Benefits  Continue with therapy Continue with compliance of taking medication   Depression screen [facility] 2/9 01/25/2021 12/24/2020 12/02/2020 08/07/2020 02/07/2020  Decreased Interest 1 1 1 3 2   Down, Depressed, Hopeless 1 1 1 3 2   PHQ - 2 Score 2 2 2 6 4   Altered sleeping 2 3 3 3 3   Tired, decreased energy 3 3 3 3 3   Change in appetite 2 3 3 3 3   Feeling bad or failure about  yourself  2 2 3  0 0  Trouble concentrating 1 1 3  0 3  Moving slowly or fidgety/restless 0 0 0 0 3  Suicidal thoughts 0 0 0 0 0  PHQ-9 Score 12 14 17 15 19   Difficult doing work/chores Somewhat difficult Very difficult Extremely dIfficult Extremely dIfficult -  Some recent data might be hidden       Follow up:  Patient agrees to Care Plan and Follow-up.  Plan: The Managed Medicaid care management team will reach out to the patient again over the next 30 days.  Date/time of next scheduled Social Work care management/care coordination outreach:  07/08/21  [name], BSW, MSW, LCSW Managed Medicaid LCSW [email or web address] Phone: [phone]

## 2021-06-22 ENCOUNTER — Other Ambulatory Visit: Payer: Self-pay

## 2021-06-30 ENCOUNTER — Other Ambulatory Visit: Payer: Self-pay

## 2021-06-30 ENCOUNTER — Other Ambulatory Visit: Payer: Self-pay | Admitting: *Deleted

## 2021-06-30 NOTE — Patient Outreach (Signed)
Care Coordination  06/30/2021  [name] 08/31/1975 [id number]  Successful telephone outreach with Ms. Bok this morning. However, she was feeling tired and did not feel like talking. Ms. Bangerter rescheduled the appointment for back injections for 07/22/21. RNCM will reach out to Ms. Wetherell again on 07/23/21 @ 9am. Ms. Collings denied any needs this morning and agreed to new date and time.   Lurena Joiner RN, BSN [place] RN Care Coordinator

## 2021-07-08 ENCOUNTER — Ambulatory Visit: Payer: Self-pay

## 2021-07-22 DIAGNOSIS — M5416 Radiculopathy, lumbar region: Secondary | ICD-10-CM | POA: Diagnosis not present

## 2021-07-23 ENCOUNTER — Other Ambulatory Visit: Payer: Self-pay | Admitting: *Deleted

## 2021-07-23 NOTE — Patient Instructions (Signed)
Visit Information  [name]  - as a part of your Medicaid benefit, you are eligible for care management and care coordination services at no cost or copay. I was unable to reach you by phone today but would be happy to help you with your health related needs. Please feel free to call me @ [phone].   A member of the Managed Medicaid care management team will reach out to you again over the next 14 days.   Lurena Joiner RN, BSN [place] RN Care Coordinator

## 2021-07-23 NOTE — Patient Outreach (Signed)
Care Coordination  07/23/2021  [name] Oct 31, 1975 [id number]   Medicaid Managed Care   Unsuccessful Outreach Note  07/23/2021 Name: [name] MRN: [id number] DOB: 1976/04/18  Referred by: Lattie Haw, MD Reason for referral : High Risk Managed Medicaid (Unsuccessful RNCM follow up telephone outreach)   An unsuccessful telephone outreach was attempted today. The patient was referred to the case management team for assistance with care management and care coordination.   Follow Up Plan: A HIPAA compliant phone message was left for the patient providing contact information and requesting a return call.   Lurena Joiner RN, BSN [place] RN Care Coordinator

## 2021-07-29 ENCOUNTER — Other Ambulatory Visit: Payer: Self-pay | Admitting: *Deleted

## 2021-07-29 NOTE — Patient Outreach (Signed)
Care Coordination  07/29/2021  [name] 17-Oct-1975 [id number]   Medicaid Managed Care   Unsuccessful Outreach Note  07/29/2021 Name: [name] MRN: [id number] DOB: 12-09-1975  Referred by: Lattie Haw, MD Reason for referral : High Risk Managed Medicaid (Unsuccessful RNCM follow up outreach, 2nd attempt)   A second unsuccessful telephone outreach was attempted today. The patient was referred to the case management team for assistance with care management and care coordination.   Follow Up Plan: A HIPAA compliant phone message was left for the patient providing contact information and requesting a return call.   Lurena Joiner RN, BSN [place] RN Care Coordinator

## 2021-07-29 NOTE — Patient Instructions (Signed)
Visit Information  Ms. Shemekia Flo Shanks  - as a part of your Medicaid benefit, you are eligible for care management and care coordination services at no cost or copay. I was unable to reach you by phone today but would be happy to help you with your health related needs. Please feel free to call me @ [phone].   A member of the Managed Medicaid care management team will reach out to you again over the next 14 days.   Lurena Joiner RN, BSN [place] RN Care Coordinator

## 2021-08-02 ENCOUNTER — Telehealth: Payer: Self-pay | Admitting: Family Medicine

## 2021-08-02 NOTE — Telephone Encounter (Signed)
.. °  Medicaid Managed Care   Unsuccessful Outreach Note  08/02/2021 Name: [name] MRN: [id number] DOB: 10-14-1975  Referred by: Lattie Haw, MD Reason for referral : High Risk Managed Medicaid (I called the patient today to get her rescheduled with the MM RNCM. I left my name and number on her VM.)   An unsuccessful telephone outreach was attempted today. The patient was referred to the case management team for assistance with care management and care coordination.   Follow Up Plan: The care management team will reach out to the patient again over the next 7 days.    [place]

## 2021-08-04 ENCOUNTER — Other Ambulatory Visit: Payer: Medicaid Other | Admitting: *Deleted

## 2021-08-04 ENCOUNTER — Other Ambulatory Visit: Payer: Self-pay

## 2021-08-04 NOTE — Patient Outreach (Addendum)
Care Coordination  08/04/2021  [name] May 10, 1976 [id number]   Medicaid Managed Care   Unsuccessful Outreach Note  08/04/2021 Name: [name] MRN: [id number] DOB: 22-Jan-1976  Referred by: Lattie Haw, MD Reason for referral : Case Closure (Case Closure for 3 or more unsuccessful outreach attempts)   Three unsuccessful telephone outreach attempts have been made. The patient was referred to the case management team for assistance with care management and care coordination. The patient's primary care provider has been notified of our unsuccessful attempts to make or maintain contact with the patient. The care management team is pleased to engage with this patient at any time in the future should he/she be interested in assistance from the care management team.   Follow Up Plan: We have been unable to make contact with the patient for follow up. The care management team is available to follow up with the patient after provider conversation with the patient regarding recommendation for care management engagement and subsequent re-referral to the care management team.   Lurena Joiner RN, BSN [place] RN Care Coordinator

## 2021-08-19 DIAGNOSIS — Z6830 Body mass index (BMI) 30.0-30.9, adult: Secondary | ICD-10-CM | POA: Diagnosis not present

## 2021-08-19 DIAGNOSIS — M4802 Spinal stenosis, cervical region: Secondary | ICD-10-CM | POA: Diagnosis not present

## 2021-08-19 DIAGNOSIS — M5416 Radiculopathy, lumbar region: Secondary | ICD-10-CM | POA: Diagnosis not present

## 2021-08-19 DIAGNOSIS — I10 Essential (primary) hypertension: Secondary | ICD-10-CM | POA: Diagnosis not present

## 2021-09-13 DIAGNOSIS — M5416 Radiculopathy, lumbar region: Secondary | ICD-10-CM | POA: Diagnosis not present

## 2021-10-08 ENCOUNTER — Other Ambulatory Visit: Payer: Self-pay | Admitting: Family Medicine

## 2021-10-12 DIAGNOSIS — S129XXA Fracture of neck, unspecified, initial encounter: Secondary | ICD-10-CM | POA: Diagnosis not present

## 2021-10-14 ENCOUNTER — Other Ambulatory Visit: Payer: Self-pay | Admitting: Neurosurgery

## 2021-10-14 DIAGNOSIS — S129XXA Fracture of neck, unspecified, initial encounter: Secondary | ICD-10-CM

## 2021-10-18 ENCOUNTER — Ambulatory Visit: Payer: Medicaid Other | Admitting: Family Medicine

## 2021-11-05 ENCOUNTER — Ambulatory Visit
Admission: RE | Admit: 2021-11-05 | Discharge: 2021-11-05 | Disposition: A | Discharge status: Home / Self Care | Payer: Medicaid Other | Source: Ambulatory Visit | Attending: Neurosurgery | Admitting: Neurosurgery

## 2021-11-05 DIAGNOSIS — S129XXA Fracture of neck, unspecified, initial encounter: Secondary | ICD-10-CM

## 2021-11-05 DIAGNOSIS — M542 Cervicalgia: Secondary | ICD-10-CM | POA: Diagnosis not present

## 2021-11-23 ENCOUNTER — Encounter: Payer: Self-pay | Admitting: *Deleted

## 2021-11-23 DIAGNOSIS — Z6829 Body mass index (BMI) 29.0-29.9, adult: Secondary | ICD-10-CM | POA: Diagnosis not present

## 2021-11-23 DIAGNOSIS — M4802 Spinal stenosis, cervical region: Secondary | ICD-10-CM | POA: Diagnosis not present

## 2021-12-29 DIAGNOSIS — F319 Bipolar disorder, unspecified: Secondary | ICD-10-CM | POA: Diagnosis not present

## 2021-12-29 DIAGNOSIS — F411 Generalized anxiety disorder: Secondary | ICD-10-CM | POA: Diagnosis not present

## 2021-12-29 DIAGNOSIS — F4310 Post-traumatic stress disorder, unspecified: Secondary | ICD-10-CM | POA: Diagnosis not present

## 2022-02-09 ENCOUNTER — Other Ambulatory Visit: Payer: Self-pay

## 2022-02-09 MED ORDER — GABAPENTIN 600 MG PO TABS: ORAL_TABLET | ORAL | 2 refills | Status: DC

## 2022-02-23 DIAGNOSIS — M4802 Spinal stenosis, cervical region: Secondary | ICD-10-CM | POA: Diagnosis not present

## 2022-03-10 DIAGNOSIS — H5213 Myopia, bilateral: Secondary | ICD-10-CM | POA: Diagnosis not present

## 2022-03-28 ENCOUNTER — Other Ambulatory Visit: Payer: Self-pay

## 2022-03-28 MED ORDER — AMLODIPINE BESYLATE 5 MG PO TABS: 5.0000 mg | ORAL_TABLET | Freq: Every day | ORAL | 3 refills | Status: DC

## 2022-04-03 IMAGING — MR MR LUMBAR SPINE W/O CM
4 of 5 series · 25 of 48 positions shown · non-contrast
Comparison: Lumbar radiographs 02/03/2021. CT Abdomen and Pelvis
06/16/2015.

CLINICAL DATA: 45-year-old female with right side pain radiating to
hip and leg following spine surgery [REDACTED]. Pain in both feet.

EXAM:
MRI LUMBAR SPINE WITHOUT CONTRAST
TECHNIQUE: Multiplanar, multisequence MR imaging of the lumbar spine was
performed. No intravenous contrast was administered.

[Series 3: T2 · sagittal · 4.0mm · 0.59mm/px · 6 of 17 slices shown (1 of 2)]
[im 1/17]
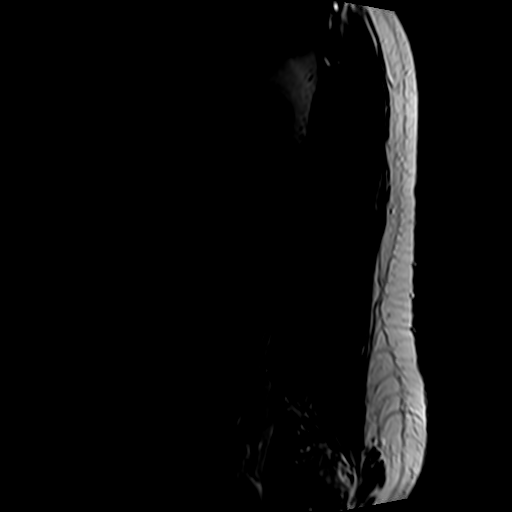
[im 4/17]
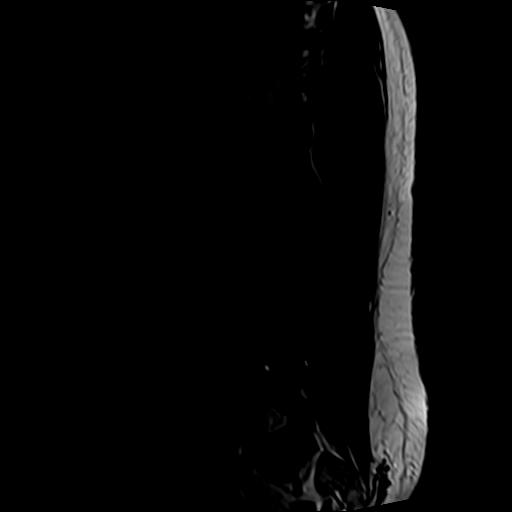
[im 7/17]
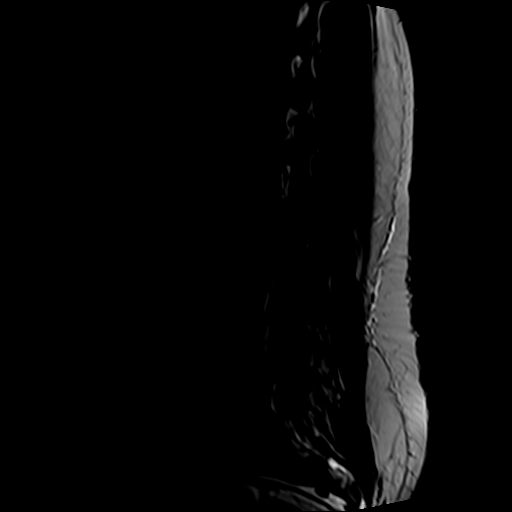
[im 10/17]
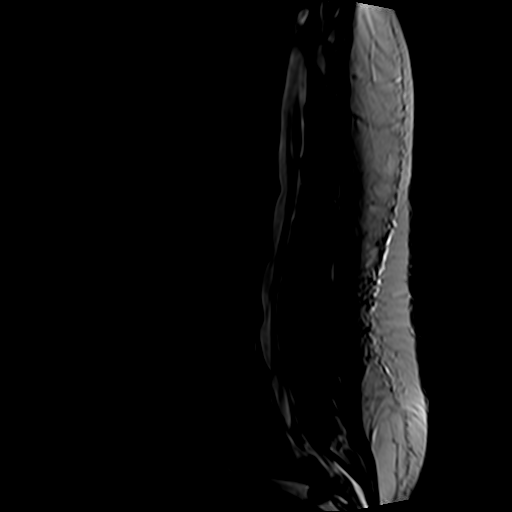
[im 13/17]
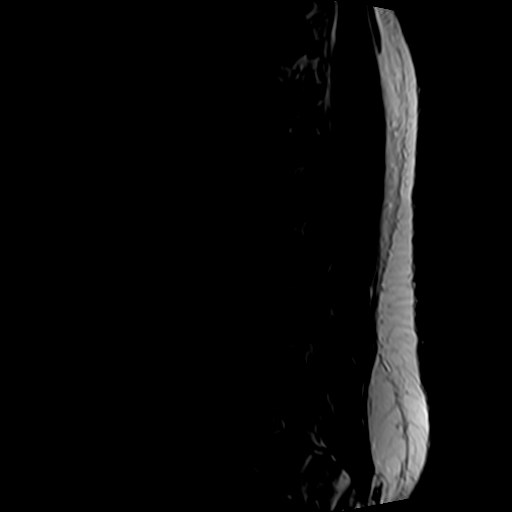
[im 17/17]
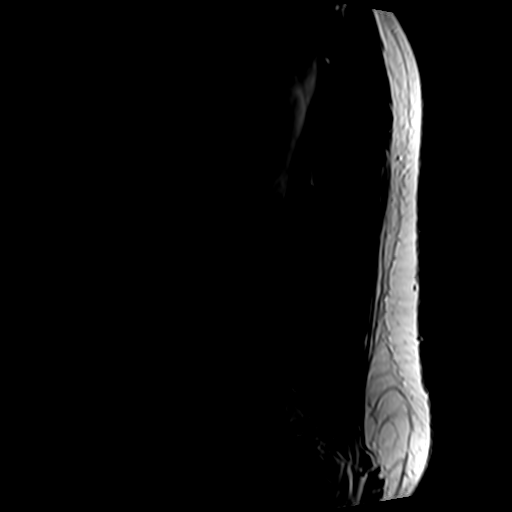

[Series 5: T1 · sagittal · 4.0mm · 0.59mm/px · 6 of 17 slices shown (1 of 2)]
[im 1/17]
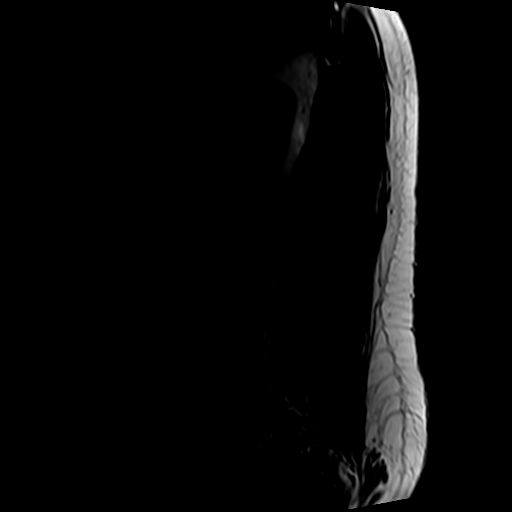
[im 4/17]
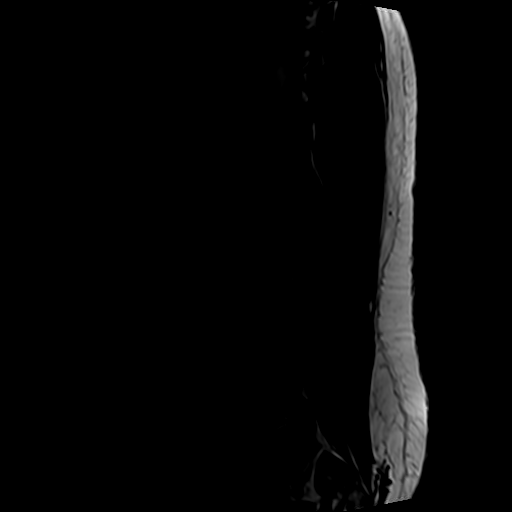
[im 7/17]
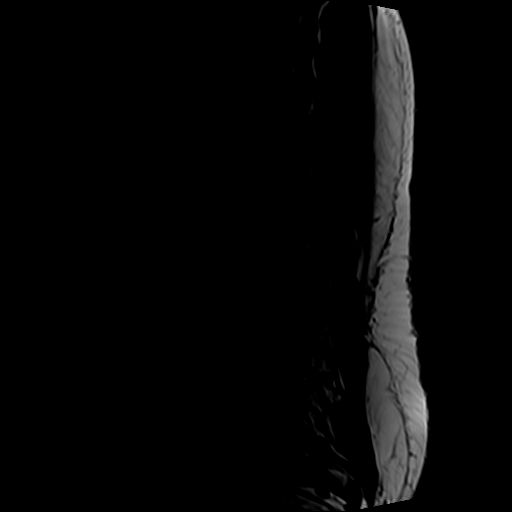
[im 10/17]
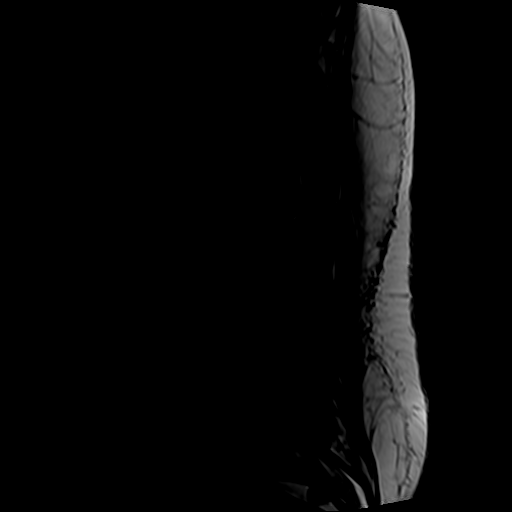
[im 13/17]
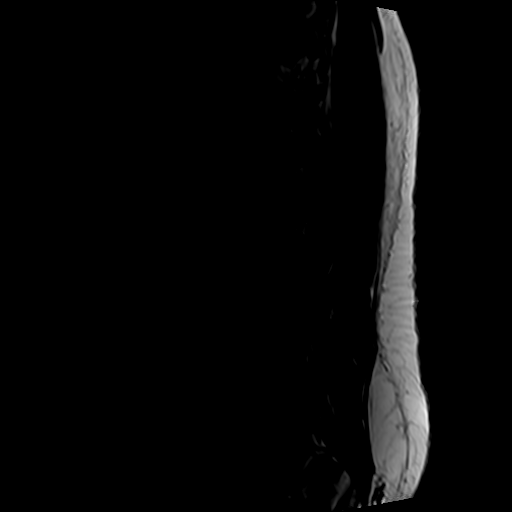
[im 17/17]
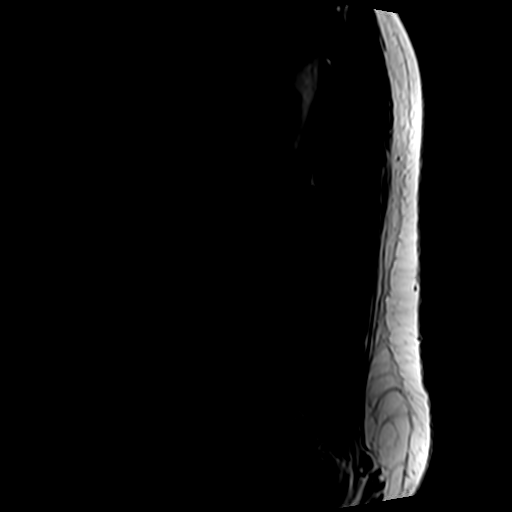

[Series 6: T2 · axial · 4.0mm · 0.78mm/px · z∈[-100,+127]mm · 9 of 44 slices shown (2 of 2)]
[im 1/44]
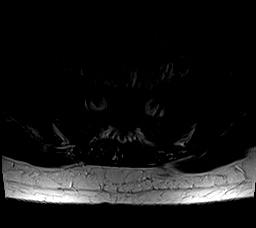
[im 7/44]
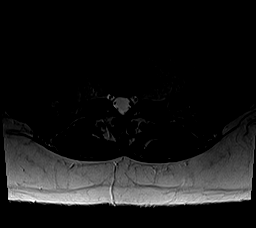
[im 13/44]
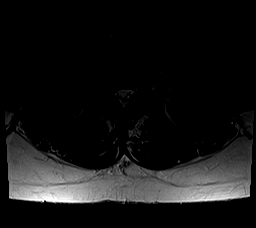
[im 19/44]
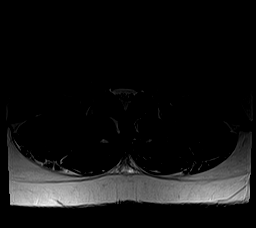
[im 22/44]
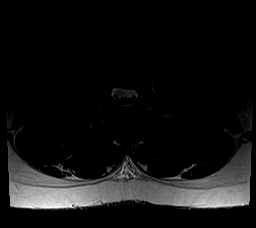
[im 25/44]
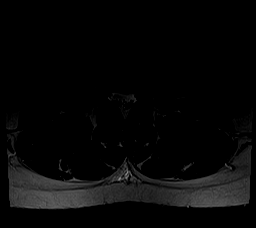
[im 31/44]
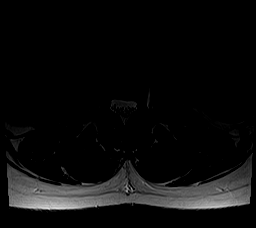
[im 37/44]
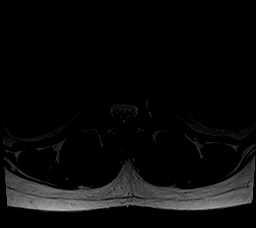
[im 44/44]
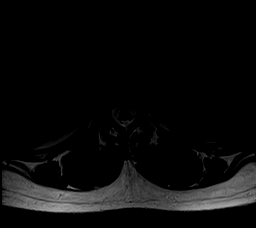

[Series 7: T1 · axial · 4.0mm · 0.39mm/px · z∈[-100,+91]mm · 4 of 44 slices shown (2 of 2)]
[im 1/44]
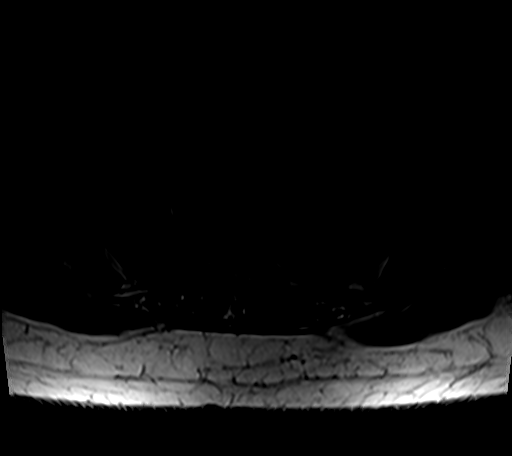
[im 7/44]
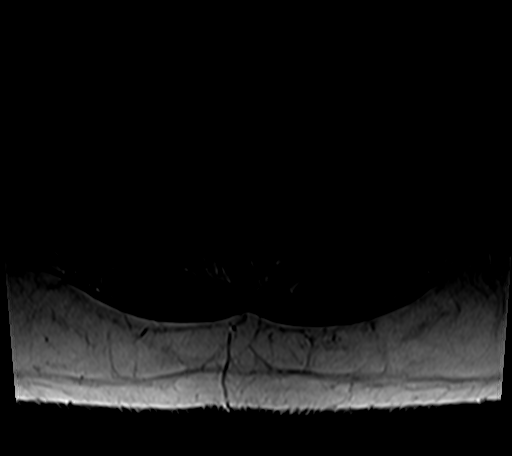
[im 22/44]
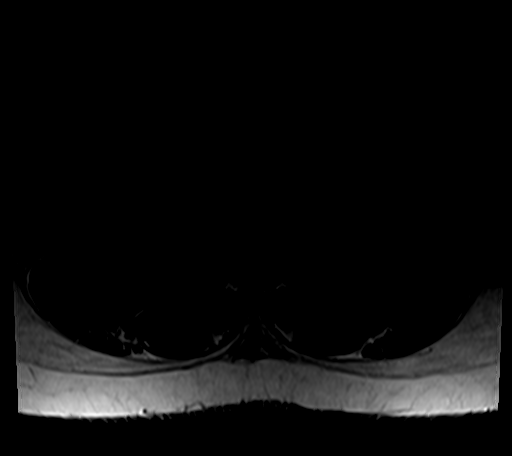
[im 37/44]
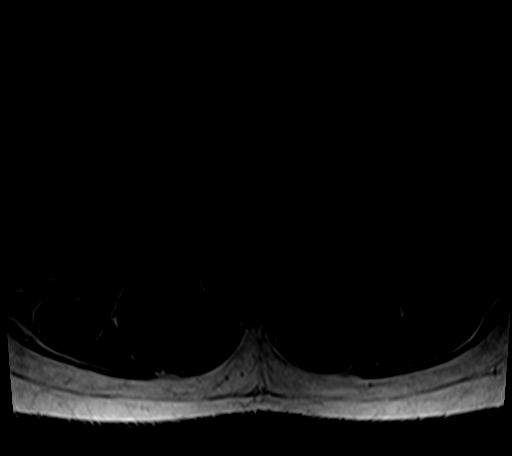

[25 of 48 positions shown; findings below may reference images not displayed]

FINDINGS: Segmentation:  Normal on the comparison.

Alignment: Subtle retrolisthesis of L2 on L3. Otherwise stable
lumbar lordosis since 0251.

Vertebrae: No marrow edema or evidence of acute osseous abnormality.
Visualized bone marrow signal is within normal limits. Intact
visible sacrum and SI joints.

Conus medullaris and cauda equina: Conus extends to the T12-L1
level. No lower spinal cord or conus signal abnormality.

Paraspinal and other soft tissues: Negative.

Disc levels:

Visible lower thoracic levels appear grossly negative.

T12-L1:  Mild facet hypertrophy. No stenosis.

L1-L2: Subtle left paracentral disc bulge or protrusion (series 6,
image 12). No stenosis.

L2-L3: Disc desiccation. Circumferential disc bulge with broad-based
left paracentral posterior component (series 6, image 18). Mild
facet hypertrophy and epidural lipomatosis. Mild spinal and left
lateral recess stenosis (left L3 nerve level). No foraminal
involvement.

L3-L4:  Mild facet hypertrophy. No stenosis.

L4-L5: Mild disc desiccation and circumferential disc bulge.
Moderate facet hypertrophy. Trace degenerative facet joint fluid on
the right, and too small left-side synovial cysts, including 1
projecting anteriorly into the left L4 neural foramen (series 4,
image 14 and series 6, image 31). No spinal or lateral recess
stenosis. There is also mild multifactorial right L4 foraminal
stenosis.

L5-S1:  Minor endplate spurring and facet hypertrophy. No stenosis.
IMPRESSION: 1. Mild retrolisthesis at L2-L3 with disc and posterior element
degeneration. Subsequent mild spinal and left lateral recess
stenosis.
2. Lumbar disc and facet degeneration at L4-L5 combining for mild
bilateral foraminal stenosis - including related to a small synovial
cyst on the left.

## 2022-05-18 DIAGNOSIS — F4310 Post-traumatic stress disorder, unspecified: Secondary | ICD-10-CM | POA: Diagnosis not present

## 2022-05-18 DIAGNOSIS — F319 Bipolar disorder, unspecified: Secondary | ICD-10-CM | POA: Diagnosis not present

## 2022-05-18 DIAGNOSIS — F411 Generalized anxiety disorder: Secondary | ICD-10-CM | POA: Diagnosis not present

## 2022-05-30 ENCOUNTER — Other Ambulatory Visit: Payer: Self-pay | Admitting: Student

## 2022-09-03 ENCOUNTER — Other Ambulatory Visit: Payer: Self-pay | Admitting: Student

## 2022-09-08 DIAGNOSIS — M4802 Spinal stenosis, cervical region: Secondary | ICD-10-CM | POA: Diagnosis not present

## 2022-09-08 DIAGNOSIS — Z6830 Body mass index (BMI) 30.0-30.9, adult: Secondary | ICD-10-CM | POA: Diagnosis not present

## 2022-09-13 ENCOUNTER — Other Ambulatory Visit: Payer: Self-pay | Admitting: Neurosurgery

## 2022-09-13 DIAGNOSIS — M4802 Spinal stenosis, cervical region: Secondary | ICD-10-CM

## 2022-09-15 DIAGNOSIS — F319 Bipolar disorder, unspecified: Secondary | ICD-10-CM | POA: Diagnosis not present

## 2022-09-15 DIAGNOSIS — F411 Generalized anxiety disorder: Secondary | ICD-10-CM | POA: Diagnosis not present

## 2022-09-15 DIAGNOSIS — F4310 Post-traumatic stress disorder, unspecified: Secondary | ICD-10-CM | POA: Diagnosis not present

## 2022-10-10 ENCOUNTER — Ambulatory Visit
Admission: RE | Admit: 2022-10-10 | Discharge: 2022-10-10 | Disposition: A | Discharge status: Home / Self Care | Payer: Medicaid Other | Source: Ambulatory Visit | Attending: Neurosurgery | Admitting: Neurosurgery

## 2022-10-10 DIAGNOSIS — M4802 Spinal stenosis, cervical region: Secondary | ICD-10-CM | POA: Diagnosis not present

## 2022-11-09 DIAGNOSIS — M4802 Spinal stenosis, cervical region: Secondary | ICD-10-CM | POA: Diagnosis not present

## 2022-11-09 DIAGNOSIS — M5416 Radiculopathy, lumbar region: Secondary | ICD-10-CM | POA: Diagnosis not present

## 2022-11-09 DIAGNOSIS — Z6831 Body mass index (BMI) 31.0-31.9, adult: Secondary | ICD-10-CM | POA: Diagnosis not present

## 2022-11-21 ENCOUNTER — Ambulatory Visit: Payer: Medicaid Other | Admitting: Student

## 2022-11-30 DIAGNOSIS — R200 Anesthesia of skin: Secondary | ICD-10-CM | POA: Diagnosis not present

## 2022-11-30 DIAGNOSIS — Z7689 Persons encountering health services in other specified circumstances: Secondary | ICD-10-CM | POA: Diagnosis not present

## 2022-11-30 DIAGNOSIS — M5416 Radiculopathy, lumbar region: Secondary | ICD-10-CM | POA: Diagnosis not present

## 2022-11-30 DIAGNOSIS — M5126 Other intervertebral disc displacement, lumbar region: Secondary | ICD-10-CM | POA: Diagnosis not present

## 2022-11-30 DIAGNOSIS — M545 Low back pain, unspecified: Secondary | ICD-10-CM | POA: Diagnosis not present

## 2022-12-05 ENCOUNTER — Ambulatory Visit: Payer: Medicaid Other

## 2022-12-07 ENCOUNTER — Ambulatory Visit (INDEPENDENT_AMBULATORY_CARE_PROVIDER_SITE_OTHER): Payer: Medicaid Other | Admitting: Student

## 2022-12-07 ENCOUNTER — Encounter: Payer: Self-pay | Admitting: Student

## 2022-12-07 VITALS — BP 140/60 | HR 80 | Ht 68.0 in | Wt 211.6 lb

## 2022-12-07 DIAGNOSIS — I10 Essential (primary) hypertension: Secondary | ICD-10-CM

## 2022-12-07 DIAGNOSIS — E782 Mixed hyperlipidemia: Secondary | ICD-10-CM

## 2022-12-07 DIAGNOSIS — F419 Anxiety disorder, unspecified: Secondary | ICD-10-CM | POA: Diagnosis not present

## 2022-12-07 DIAGNOSIS — Z13220 Encounter for screening for lipoid disorders: Secondary | ICD-10-CM | POA: Diagnosis not present

## 2022-12-07 DIAGNOSIS — Z1159 Encounter for screening for other viral diseases: Secondary | ICD-10-CM

## 2022-12-07 DIAGNOSIS — Z1329 Encounter for screening for other suspected endocrine disorder: Secondary | ICD-10-CM | POA: Diagnosis not present

## 2022-12-07 DIAGNOSIS — Z131 Encounter for screening for diabetes mellitus: Secondary | ICD-10-CM

## 2022-12-07 DIAGNOSIS — Z1239 Encounter for other screening for malignant neoplasm of breast: Secondary | ICD-10-CM | POA: Diagnosis not present

## 2022-12-07 DIAGNOSIS — Z0000 Encounter for general adult medical examination without abnormal findings: Secondary | ICD-10-CM

## 2022-12-07 MED ORDER — ALPRAZOLAM 0.5 MG PO TABS: 0.5000 mg | ORAL_TABLET | Freq: Every evening | ORAL | 0 refills | Status: DC | PRN

## 2022-12-07 MED ORDER — ATORVASTATIN CALCIUM 80 MG PO TABS: 80.0000 mg | ORAL_TABLET | Freq: Every day | ORAL | 3 refills | Status: AC

## 2022-12-07 NOTE — Patient Instructions (Addendum)
It was great meeting you today, [name]!  As we discussed, I ordered a screening mammogram, please call the GI breast center to get this scheduled. We are checking a lipid panel, thyroid level and other blood tests today. I will call you with the results. I sent in Atorvostatin 80 mg daily for high lipids. Please start this when you pick it up. Follow up with me in 1-2 months so I can see how you are doing.   If you have any questions or concerns, please feel free to call the clinic.   Have a wonderful day,  Dr. Darral Dash [facility] [phone]

## 2022-12-07 NOTE — Assessment & Plan Note (Signed)
Lipid panel today Refilled atorvastatin 80 mg daily

## 2022-12-07 NOTE — Progress Notes (Signed)
  SUBJECTIVE:   CHIEF COMPLAINT / HPI:   [name] is a 47 year-old female here for annual exam.  She has not been seen at [facility] since [facility]22.  For cervical spinal stenosis with radiculopathy, she is to follow with pain management and was taking chronic opioids.  She not like how they made her feel, and has decided to stop taking them and only takes gabapentin and 100 mg 3 times daily.  Would like to get her mammogram done. Other than severe anxiety, she denies any other complaints.   No chest pain, shortness of breath, dizziness or weakness.  PERTINENT  PMH / PSH:   Past Medical History:  Diagnosis Date   Abnormal uterine bleeding (AUB) 01/21/2014   Adenomyosis 01/21/2014   Anemia    Anxiety    no meds   Depression    no meds   Encounter for female sterilization procedure 12/06/2011   Endometrioma of ovary 08/04/2015   Fibroids    Fibromyalgia    no meds   History of preterm labor    History of recurrent UTI (urinary tract infection)    Intramural leiomyoma of uterus 01/21/2014   Mild preeclampsia 09/20/2011   Hix with 2013 pregnancy  PIH - resolved, no longer an issue   Obese    Pelvic pain in female 01/21/2014   Polyhydramnios 09/20/2011   S/P cesarean section 09/20/2011   S/P tubal ligation 12/07/2011   SVD (spontaneous vaginal delivery)    x 2   Uterine fibroids affecting pregnancy    Vaginal delivery 1995, 1998    Patient Care Team: Darral Dash, DO as PCP - General (Family Medicine) Gustavus Bryant, LCSW as [facility] Care Management (Licensed Clinical Social Worker) Cheral Almas, RPH-CPP (Inactive) (Pharmacist) OBJECTIVE:  BP (!) 140/60   Pulse 80   Ht 5\' 8"  (1.727 m)   Wt 211 lb 9.6 oz (96 kg)   LMP 01/08/2014   SpO2 100%   BMI 32.17 kg/m  Physical Exam Constitutional:      Appearance: Normal appearance.  Cardiovascular:     Rate and Rhythm: Normal rate and regular rhythm.  Pulmonary:     Effort: Pulmonary effort is normal.     Breath sounds:  Normal breath sounds.  Skin:    General: Skin is warm and dry.  Neurological:     General: No focal deficit present.     Mental Status: She is alert and oriented to person, place, and time.      ASSESSMENT/PLAN:   HTN (hypertension) BP 148/98, 140/60 repeat. Will need increase of amlodipine to 10 mg daily BMP today  Anxiety Alprazolam 0.5 mg nightly as needed given severe anxiety with underlying pain. Will not increase dose or frequency. Patient understands.  Healthcare maintenance Ordered screening mammogram.  Patient is to schedule. Patient follows with OB/GYN for Pap smear, etc. given she is s/p hysterectomy  Mixed hyperlipidemia Lipid panel today Refilled atorvastatin 80 mg daily     No follow-ups on file. Darral Dash, DO 12/07/2022, 7:56 PM PGY-2, [place] [facility]

## 2022-12-07 NOTE — Assessment & Plan Note (Signed)
Ordered screening mammogram.  Patient is to schedule. Patient follows with OB/GYN for Pap smear, etc. given she is s/p hysterectomy

## 2022-12-07 NOTE — Assessment & Plan Note (Signed)
Alprazolam 0.5 mg nightly as needed given severe anxiety with underlying pain. Will not increase dose or frequency. Patient understands.

## 2022-12-07 NOTE — Assessment & Plan Note (Addendum)
BP 148/98, 140/60 repeat. Will need increase of amlodipine to 10 mg daily BMP today

## 2022-12-08 LAB — CBC
Hematocrit: 40.5 % (ref 34.0–46.6)
Hemoglobin: 14.2 g/dL (ref 11.1–15.9)
MCH: 31.3 pg (ref 26.6–33.0)
MCHC: 35.1 g/dL (ref 31.5–35.7)
MCV: 89 fL (ref 79–97)
Platelets: 292 10*3/uL (ref 150–450)
RBC: 4.54 x10E6/uL (ref 3.77–5.28)
RDW: 11.9 % (ref 11.7–15.4)
WBC: 10.9 10*3/uL — ABNORMAL HIGH (ref 3.4–10.8)

## 2022-12-08 LAB — LIPID PANEL
Chol/HDL Ratio: 6.9 ratio — ABNORMAL HIGH (ref 0.0–4.4)
Cholesterol, Total: 213 mg/dL — ABNORMAL HIGH (ref 100–199)
HDL: 31 mg/dL — ABNORMAL LOW (ref >39)
LDL Chol Calc (NIH): 127 mg/dL — ABNORMAL HIGH (ref 0–99)
Triglycerides: 311 mg/dL — ABNORMAL HIGH (ref 0–149)
VLDL Cholesterol Cal: 55 mg/dL — ABNORMAL HIGH (ref 5–40)

## 2022-12-08 LAB — COMPREHENSIVE METABOLIC PANEL
ALT: 13 IU/L (ref 0–32)
AST: 15 IU/L (ref 0–40)
Albumin: 4.5 g/dL (ref 3.9–4.9)
Alkaline Phosphatase: 75 IU/L (ref 44–121)
BUN/Creatinine Ratio: 19 (ref 9–23)
BUN: 12 mg/dL (ref 6–24)
Bilirubin Total: <0.2 mg/dL (ref 0.0–1.2)
CO2: 20 mmol/L (ref 20–29)
Calcium: 9.6 mg/dL (ref 8.7–10.2)
Chloride: 106 mmol/L (ref 96–106)
Creatinine, Ser: 0.63 mg/dL (ref 0.57–1.00)
Globulin, Total: 2.4 g/dL (ref 1.5–4.5)
Glucose: 90 mg/dL (ref 70–99)
Potassium: 3.9 mmol/L (ref 3.5–5.2)
Sodium: 140 mmol/L (ref 134–144)
Total Protein: 6.9 g/dL (ref 6.0–8.5)
eGFR: 109 mL/min/{1.73_m2} (ref >59)

## 2022-12-08 LAB — HEMOGLOBIN A1C
Est. average glucose Bld gHb Est-mCnc: 105 mg/dL
Hgb A1c MFr Bld: 5.3 % (ref 4.8–5.6)

## 2022-12-08 LAB — HCV INTERPRETATION

## 2022-12-08 LAB — HCV AB W REFLEX TO QUANT PCR: HCV Ab: NONREACTIVE

## 2022-12-08 LAB — TSH: TSH: 1.41 u[IU]/mL (ref 0.450–4.500)

## 2022-12-12 ENCOUNTER — Other Ambulatory Visit: Payer: Self-pay | Admitting: Student

## 2022-12-27 NOTE — Progress Notes (Signed)
I was available to precept this patient with Dr Melissa Noon.

## 2022-12-29 DIAGNOSIS — Z6832 Body mass index (BMI) 32.0-32.9, adult: Secondary | ICD-10-CM | POA: Diagnosis not present

## 2022-12-29 DIAGNOSIS — M5416 Radiculopathy, lumbar region: Secondary | ICD-10-CM | POA: Diagnosis not present

## 2023-01-04 ENCOUNTER — Other Ambulatory Visit: Payer: Self-pay | Admitting: Student

## 2023-01-10 ENCOUNTER — Ambulatory Visit
Admission: RE | Admit: 2023-01-10 | Discharge: 2023-01-10 | Disposition: A | Discharge status: Home / Self Care | Payer: Medicaid Other | Source: Ambulatory Visit | Attending: Family Medicine | Admitting: Family Medicine

## 2023-01-10 DIAGNOSIS — Z1231 Encounter for screening mammogram for malignant neoplasm of breast: Secondary | ICD-10-CM | POA: Diagnosis not present

## 2023-01-10 DIAGNOSIS — Z1239 Encounter for other screening for malignant neoplasm of breast: Secondary | ICD-10-CM

## 2023-01-12 DIAGNOSIS — M5416 Radiculopathy, lumbar region: Secondary | ICD-10-CM | POA: Diagnosis not present

## 2023-01-13 ENCOUNTER — Other Ambulatory Visit: Payer: Self-pay | Admitting: Family Medicine

## 2023-01-13 DIAGNOSIS — R928 Other abnormal and inconclusive findings on diagnostic imaging of breast: Secondary | ICD-10-CM

## 2023-01-24 ENCOUNTER — Ambulatory Visit: Admission: RE | Admit: 2023-01-24 | Payer: Medicaid Other | Source: Ambulatory Visit

## 2023-01-24 ENCOUNTER — Other Ambulatory Visit: Payer: Self-pay | Admitting: Family Medicine

## 2023-01-24 ENCOUNTER — Ambulatory Visit
Admission: RE | Admit: 2023-01-24 | Discharge: 2023-01-24 | Disposition: A | Discharge status: Home / Self Care | Payer: Medicaid Other | Source: Ambulatory Visit | Attending: Family Medicine | Admitting: Family Medicine

## 2023-01-24 DIAGNOSIS — R928 Other abnormal and inconclusive findings on diagnostic imaging of breast: Secondary | ICD-10-CM

## 2023-01-24 DIAGNOSIS — N6310 Unspecified lump in the right breast, unspecified quadrant: Secondary | ICD-10-CM

## 2023-01-24 DIAGNOSIS — N6313 Unspecified lump in the right breast, lower outer quadrant: Secondary | ICD-10-CM | POA: Diagnosis not present

## 2023-01-24 DIAGNOSIS — N6011 Diffuse cystic mastopathy of right breast: Secondary | ICD-10-CM | POA: Diagnosis not present

## 2023-02-06 ENCOUNTER — Encounter: Payer: Self-pay | Admitting: Student

## 2023-02-06 ENCOUNTER — Ambulatory Visit (INDEPENDENT_AMBULATORY_CARE_PROVIDER_SITE_OTHER): Payer: Medicaid Other | Admitting: Student

## 2023-02-06 ENCOUNTER — Ambulatory Visit
Admission: RE | Admit: 2023-02-06 | Discharge: 2023-02-06 | Disposition: A | Discharge status: Home / Self Care | Payer: Medicaid Other | Source: Ambulatory Visit | Attending: Family Medicine | Admitting: Family Medicine

## 2023-02-06 VITALS — BP 145/96 | HR 91 | Temp 98.8°F | Ht 68.0 in | Wt 205.0 lb

## 2023-02-06 DIAGNOSIS — U071 COVID-19: Secondary | ICD-10-CM | POA: Diagnosis present

## 2023-02-06 DIAGNOSIS — R059 Cough, unspecified: Secondary | ICD-10-CM | POA: Insufficient documentation

## 2023-02-06 DIAGNOSIS — R051 Acute cough: Secondary | ICD-10-CM

## 2023-02-06 DIAGNOSIS — I10 Essential (primary) hypertension: Secondary | ICD-10-CM

## 2023-02-06 MED ORDER — ALPRAZOLAM 0.5 MG PO TABS: 0.5000 mg | ORAL_TABLET | Freq: Every evening | ORAL | 0 refills | Status: DC | PRN

## 2023-02-06 NOTE — Assessment & Plan Note (Signed)
BP not quite at goal today-145/96.  However, she is acutely ill with URI (COVID) and having stressors regarding her health. For now, continue amlodipine 10 mg daily.  I am hopeful that when her illness resolves, she will be more normotensive. Patient to schedule nurse visit in 2 weeks for blood pressure recheck.  If at that visit, BP is > 130/90, will start losartan 25 mg daily with plan for office visit with me 2 weeks after that.

## 2023-02-06 NOTE — Assessment & Plan Note (Signed)
Acute cough likely secondary to COVID No concerning focal lung findings I did obtain chest x-ray given persistence of symptoms and patient concerned.  Reassuringly, no signs of pneumonia or other acute cardiopulmonary concern Discussed supportive care with fluid hydration, honey, hot tea and that symptoms may persist for up to 6 weeks with a postviral cough syndrome Follow-up if worsening or no improvement in a few weeks

## 2023-02-06 NOTE — Progress Notes (Signed)
    SUBJECTIVE:   CHIEF COMPLAINT / HPI:   [name] is here to discuss hypertension and COVID with cough.  COVID/cough Reports sinus congestion, cough and a "raggedy "feeling in her chest that is not improving.  She was diagnosed with COVID on 8/6 and since then, has not been feeling much improvement. Also having decreased appetite.  Reports elevated temperatures at home at 99-100.0 Fahrenheit.  HTN Takes Amlodipine 10 mg daily Reports compliance Of note, she has had some recent stressors regarding breast mammogram with a suspicious finding.  She is to have an ultrasound-guided biopsy, but is waiting to do so until she is feeling better after her acute illness with COVID.  She thinks this may be contributing to her elevated blood pressure today.  PERTINENT  PMH / PSH: Hypertension, fibromyalgia, PTSD, s/p post cervical spinal fusion  OBJECTIVE:   BP (!) 145/96   Pulse 91   Temp 98.8 F (37.1 C) (Oral)   Ht 5\' 8"  (1.727 m)   Wt 205 lb (93 kg)   LMP 01/08/2014   SpO2 98%   BMI 31.17 kg/m   General: Pleasant, well-appearing HEENT: Wears mask, wearing soft neck brace Respiratory: Normal work of breathing on room air, intermittently with dry cough.  Lungs are clear in all fields without wheezing or crackles or diminished lung sounds Extremities: Warm and well-perfused, no swelling  ASSESSMENT/PLAN:   HTN (hypertension) BP not quite at goal today-145/96.  However, she is acutely ill with URI (COVID) and having stressors regarding her health. For now, continue amlodipine 10 mg daily.  I am hopeful that when her illness resolves, she will be more normotensive. Patient to schedule nurse visit in 2 weeks for blood pressure recheck.  If at that visit, BP is > 130/90, will start losartan 25 mg daily with plan for office visit with me 2 weeks after that.  Cough Acute cough likely secondary to COVID No concerning focal lung findings I did obtain chest x-ray given persistence of  symptoms and patient concerned.  Reassuringly, no signs of pneumonia or other acute cardiopulmonary concern Discussed supportive care with fluid hydration, honey, hot tea and that symptoms may persist for up to 6 weeks with a postviral cough syndrome Follow-up if worsening or no improvement in a few weeks     Darral Dash, DO [facility]

## 2023-02-06 NOTE — Patient Instructions (Addendum)
It was great seeing you today.  As we discussed, - Please go to [facility] on [address] to get your chest x-ray. Hydrate as much as possible and take Ibuprofen or Tylenol as needed. - I refilled your Xanx and your Amlodipine.  Schedule a nurse visit at the front desk before you leave today so we can recheck your blood pressure.   If you have any questions or concerns, please feel free to call the clinic.   Have a wonderful day,  Dr. Darral Dash [facility] [phone]

## 2023-03-08 ENCOUNTER — Other Ambulatory Visit: Payer: Self-pay | Admitting: Student

## 2023-03-10 NOTE — Telephone Encounter (Signed)
Patient calls nurse line to check status of prescription refill.   Please advise.   Veronda Prude, RN

## 2023-03-20 ENCOUNTER — Other Ambulatory Visit: Payer: Self-pay | Admitting: Student

## 2023-03-22 DIAGNOSIS — F411 Generalized anxiety disorder: Secondary | ICD-10-CM | POA: Diagnosis not present

## 2023-03-22 DIAGNOSIS — F319 Bipolar disorder, unspecified: Secondary | ICD-10-CM | POA: Diagnosis not present

## 2023-03-22 DIAGNOSIS — F4310 Post-traumatic stress disorder, unspecified: Secondary | ICD-10-CM | POA: Diagnosis not present

## 2023-04-06 ENCOUNTER — Other Ambulatory Visit: Payer: Self-pay | Admitting: Student

## 2023-04-11 ENCOUNTER — Encounter: Payer: Self-pay | Admitting: Student

## 2023-05-09 ENCOUNTER — Other Ambulatory Visit: Payer: Self-pay | Admitting: Student

## 2023-05-15 ENCOUNTER — Encounter: Payer: Self-pay | Admitting: Student

## 2023-05-30 ENCOUNTER — Encounter: Payer: Self-pay | Admitting: Student

## 2023-05-30 ENCOUNTER — Ambulatory Visit (INDEPENDENT_AMBULATORY_CARE_PROVIDER_SITE_OTHER): Payer: Medicaid Other | Admitting: Student

## 2023-05-30 VITALS — BP 139/101 | HR 113 | Ht 67.0 in | Wt 199.8 lb

## 2023-05-30 DIAGNOSIS — I10 Essential (primary) hypertension: Secondary | ICD-10-CM

## 2023-05-30 DIAGNOSIS — F419 Anxiety disorder, unspecified: Secondary | ICD-10-CM | POA: Diagnosis present

## 2023-05-30 MED ORDER — AMLODIPINE BESYLATE 10 MG PO TABS: 10.0000 mg | ORAL_TABLET | Freq: Every day | ORAL | 3 refills | Status: DC

## 2023-05-30 NOTE — Patient Instructions (Signed)
It was great seeing you today.  As we discussed, - Increase your Amlodipine to 10 mg daily. You may finish your current supply by taking 2 tablets daily until you run out. Then, take 1 tablet daily of the Amlodipine 10 mg that I sent to your pharmacy. - Please return for a blood pressure check at a nurse visit in 2 weeks.   If you have any questions or concerns, please feel free to call the clinic.   Have a wonderful day,  Dr. Darral Dash [facility] [phone]

## 2023-05-30 NOTE — Progress Notes (Signed)
    SUBJECTIVE:   CHIEF COMPLAINT / HPI:   [name] is a 47 year-old female here with concerns about her blood pressure being elevated.  She says sometimes she will feel her anxiety heightened to the point where she can feel her blood pressure being elevated.  She just followed up with her psychiatrist at [facility] who said that they are going to do a med rec at their next visit to see if there is any adjustments that they need to make. She is currently taking Zoloft 100 mg, and trazodone nightly as needed.  In regards to her blood pressure, she is taking amlodipine 5 mg daily which she was previously generally well-controlled on. She does not check her blood pressure at home. She denies any headache, shortness of breath, edema.  PERTINENT  PMH / PSH: Hypertension Cervical spondylosis with myelopathy and radiculopathy s/p cervical spinal fusion PTSD Fibromyalgia Chronic low back pain Anxiety  OBJECTIVE:   BP (!) 139/101   Pulse (!) 113   Ht 5\' 7"  (1.702 m)   Wt 199 lb 12.8 oz (90.6 kg)   LMP 01/08/2014   SpO2 97%   BMI 31.29 kg/m   General: No distress, generally well-appearing Cardiac: Regular rate and rhythm, initially in the 110s and improved to 80 bpm after relaxing  Respiratory: Breathing on room air with no wheezing or crackles. Psych: Good insight and judgment.  Pleasant.  Makes good eye contact.  ASSESSMENT/PLAN:   Anxiety Exacerbated given current legal issues with her son. She has follow-up scheduled with her psychiatrist. No identifiable acute psychiatric emergencies today.  No SI or HI. Discussed mind-body connection and relaxation techniques to utilize.  HTN (hypertension) BP elevated x 2 today initially 161/98, repeat 139/101. At our last visit, she still was not quite at goal. Plan to increase amlodipine from 5 mg to 10 mg daily. Unclear as to why she is not on ACE/ARB for renal protection, and we can address this at our follow-up visit. May  consider addition of beta-blocker given reported intermittent elevation in heart rate as well with acute worsening of anxiety.     Darral Dash, DO [facility]

## 2023-06-01 ENCOUNTER — Encounter: Payer: Self-pay | Admitting: Student

## 2023-06-01 NOTE — Assessment & Plan Note (Signed)
BP elevated x 2 today initially 161/98, repeat 139/101. At our last visit, she still was not quite at goal. Plan to increase amlodipine from 5 mg to 10 mg daily. Unclear as to why she is not on ACE/ARB for renal protection, and we can address this at our follow-up visit. May consider addition of beta-blocker given reported intermittent elevation in heart rate as well with acute worsening of anxiety.

## 2023-06-01 NOTE — Assessment & Plan Note (Signed)
Exacerbated given current legal issues with her son. She has follow-up scheduled with her psychiatrist. No identifiable acute psychiatric emergencies today.  No SI or HI. Discussed mind-body connection and relaxation techniques to utilize.

## 2023-06-07 ENCOUNTER — Other Ambulatory Visit: Payer: Self-pay | Admitting: Student

## 2023-06-23 ENCOUNTER — Other Ambulatory Visit: Payer: Self-pay | Admitting: Student

## 2023-06-26 ENCOUNTER — Telehealth: Payer: Self-pay

## 2023-06-26 DIAGNOSIS — R002 Palpitations: Secondary | ICD-10-CM

## 2023-06-26 NOTE — Telephone Encounter (Signed)
 Patient calls nurse line regarding heart monitor.   She reports that she was told at last visit that she was supposed to be mailed a heart monitor and follow up with PCP after two weeks of wearing monitor.   She states that she has not yet received monitor. I am unable to find the order per chart review.   She is also asking if she should go ahead and schedule follow up for BP or wait until after wearing heart monitor for two weeks. Will forward to PCP for further advisement.   Chiquita JAYSON English, RN

## 2023-06-30 ENCOUNTER — Ambulatory Visit: Payer: Medicaid Other | Attending: Family Medicine

## 2023-06-30 DIAGNOSIS — R002 Palpitations: Secondary | ICD-10-CM

## 2023-06-30 NOTE — Progress Notes (Unsigned)
 EP to read.

## 2023-07-03 NOTE — Telephone Encounter (Signed)
 Called patient and LVM to schedule a Nurse Visit BP Follow up per Dr. Tomi Bamberger request.  If patient calls back please schedule a BP Follow Up.  Thanks,   Drusilla Kanner, CMA

## 2023-07-05 DIAGNOSIS — Z6829 Body mass index (BMI) 29.0-29.9, adult: Secondary | ICD-10-CM | POA: Diagnosis not present

## 2023-07-05 DIAGNOSIS — M5416 Radiculopathy, lumbar region: Secondary | ICD-10-CM | POA: Diagnosis not present

## 2023-07-10 ENCOUNTER — Other Ambulatory Visit: Payer: Self-pay | Admitting: Student

## 2023-08-08 ENCOUNTER — Other Ambulatory Visit: Payer: Self-pay | Admitting: Student

## 2023-08-09 DIAGNOSIS — F4310 Post-traumatic stress disorder, unspecified: Secondary | ICD-10-CM | POA: Diagnosis not present

## 2023-08-09 DIAGNOSIS — F411 Generalized anxiety disorder: Secondary | ICD-10-CM | POA: Diagnosis not present

## 2023-08-09 DIAGNOSIS — F319 Bipolar disorder, unspecified: Secondary | ICD-10-CM | POA: Diagnosis not present

## 2023-09-14 DIAGNOSIS — M5416 Radiculopathy, lumbar region: Secondary | ICD-10-CM | POA: Diagnosis not present

## 2023-09-20 ENCOUNTER — Ambulatory Visit (INDEPENDENT_AMBULATORY_CARE_PROVIDER_SITE_OTHER): Admitting: Student

## 2023-09-20 ENCOUNTER — Other Ambulatory Visit: Payer: Self-pay | Admitting: Student

## 2023-09-20 ENCOUNTER — Encounter: Payer: Self-pay | Admitting: Student

## 2023-09-20 VITALS — BP 135/93 | HR 93 | Ht 67.0 in | Wt 205.2 lb

## 2023-09-20 DIAGNOSIS — I10 Essential (primary) hypertension: Secondary | ICD-10-CM | POA: Diagnosis not present

## 2023-09-20 DIAGNOSIS — E782 Mixed hyperlipidemia: Secondary | ICD-10-CM | POA: Diagnosis not present

## 2023-09-20 MED ORDER — LOSARTAN POTASSIUM 25 MG PO TABS: 25.0000 mg | ORAL_TABLET | Freq: Every day | ORAL | 3 refills | Status: AC

## 2023-09-20 NOTE — Patient Instructions (Signed)
 It was great seeing you today.  As we discussed, - Start taking Losartan 25 mg daily.  - Continue taking Amlodipine 10 mg daily - Return in 2 weeks for follow-up with me - Your goal blood pressure is 130/90 or less  We are checking your blood work today.   If you have any questions or concerns, please feel free to call the clinic.   Have a wonderful day,  Dr. Darral Dash [facility] [phone]

## 2023-09-20 NOTE — Assessment & Plan Note (Signed)
 Lipid panel today Continue Atorvostatin 80 mg daily

## 2023-09-20 NOTE — Progress Notes (Signed)
    SUBJECTIVE:   CHIEF COMPLAINT / HPI:   [name] is a 48 year-old female here for hypertension follow-up.  Currently taking amlodipine 10 mg daily, which was increased from 5 mg in December 2024. Checks blood pressure at home and gets readings similar to here in clinic No chest pain, shortness of breath, swelling in legs  She just got an epidural injection for her lumbar radiculopathy with Dr. Ethelene Hal.  Also due for Pap smear and colonoscopy.   Due for pneumonia vaccine.  PERTINENT  PMH / PSH:  HLD HTN Fibromyalgia PTSD, anxiety  OBJECTIVE:   BP (!) 135/93   Pulse 93   Ht 5\' 7"  (1.702 m)   Wt 205 lb 3.2 oz (93.1 kg)   LMP 01/08/2014   SpO2 99%   BMI 32.14 kg/m   General: NAD, well appearing, arrives in good spirits Cardiac: RRR Neuro: A&O Respiratory: normal WOB on RA. No wheezing or crackles on auscultation, good lung sounds throughout Extremities: Moving all 4 extremities equally, no peripheral edema   ASSESSMENT/PLAN:   HTN (hypertension) BP elevated x2 today, not at goal. Continue Amlodipine 10 mg daily Start Losartan 25 mg daily BMP today, repeat BMP in 2 weeks. Return in 2 weeks for RN visit for blood pressure follow-up. If not at goal 130/90 or less, increase Losartan to 50 mg daily.   Mixed hyperlipidemia Lipid panel today Continue Atorvostatin 80 mg daily     Darral Dash, DO [facility]

## 2023-09-20 NOTE — Assessment & Plan Note (Addendum)
 BP elevated x2 today, not at goal. Continue Amlodipine 10 mg daily Start Losartan 25 mg daily BMP today, repeat BMP in 2 weeks. Return in 2 weeks for RN visit for blood pressure follow-up. If not at goal 130/90 or less, increase Losartan to 50 mg daily.

## 2023-09-21 LAB — LIPID PANEL
Chol/HDL Ratio: 7.5 ratio — ABNORMAL HIGH (ref 0.0–4.4)
Cholesterol, Total: 241 mg/dL — ABNORMAL HIGH (ref 100–199)
HDL: 32 mg/dL — ABNORMAL LOW (ref >39)
LDL Chol Calc (NIH): 139 mg/dL — ABNORMAL HIGH (ref 0–99)
Triglycerides: 379 mg/dL — ABNORMAL HIGH (ref 0–149)
VLDL Cholesterol Cal: 70 mg/dL — ABNORMAL HIGH (ref 5–40)

## 2023-09-21 LAB — BASIC METABOLIC PANEL WITH GFR
BUN/Creatinine Ratio: 16 (ref 9–23)
BUN: 11 mg/dL (ref 6–24)
CO2: 22 mmol/L (ref 20–29)
Calcium: 9.9 mg/dL (ref 8.7–10.2)
Chloride: 101 mmol/L (ref 96–106)
Creatinine, Ser: 0.67 mg/dL (ref 0.57–1.00)
Glucose: 73 mg/dL (ref 70–99)
Potassium: 3.5 mmol/L (ref 3.5–5.2)
Sodium: 139 mmol/L (ref 134–144)
eGFR: 107 mL/min/{1.73_m2} (ref >59)

## 2023-10-09 ENCOUNTER — Other Ambulatory Visit: Payer: Self-pay | Admitting: Student

## 2023-11-01 DIAGNOSIS — M5416 Radiculopathy, lumbar region: Secondary | ICD-10-CM | POA: Diagnosis not present

## 2023-11-28 ENCOUNTER — Encounter: Payer: Self-pay | Admitting: *Deleted

## 2023-12-20 DIAGNOSIS — M5127 Other intervertebral disc displacement, lumbosacral region: Secondary | ICD-10-CM | POA: Diagnosis not present

## 2023-12-20 DIAGNOSIS — M5136 Other intervertebral disc degeneration, lumbar region with discogenic back pain only: Secondary | ICD-10-CM | POA: Diagnosis not present

## 2023-12-20 DIAGNOSIS — M5126 Other intervertebral disc displacement, lumbar region: Secondary | ICD-10-CM | POA: Diagnosis not present

## 2023-12-20 DIAGNOSIS — M48061 Spinal stenosis, lumbar region without neurogenic claudication: Secondary | ICD-10-CM | POA: Diagnosis not present

## 2023-12-20 DIAGNOSIS — M5416 Radiculopathy, lumbar region: Secondary | ICD-10-CM | POA: Diagnosis not present

## 2023-12-28 DIAGNOSIS — M5416 Radiculopathy, lumbar region: Secondary | ICD-10-CM | POA: Diagnosis not present

## 2023-12-28 DIAGNOSIS — M47816 Spondylosis without myelopathy or radiculopathy, lumbar region: Secondary | ICD-10-CM | POA: Diagnosis not present

## 2024-01-09 ENCOUNTER — Other Ambulatory Visit: Payer: Self-pay

## 2024-01-12 MED ORDER — GABAPENTIN 600 MG PO TABS: ORAL_TABLET | ORAL | 3 refills | Status: DC

## 2024-01-12 MED ORDER — ALPRAZOLAM 0.5 MG PO TABS: 0.5000 mg | ORAL_TABLET | Freq: Every day | ORAL | 2 refills | Status: DC | PRN

## 2024-01-30 DIAGNOSIS — F411 Generalized anxiety disorder: Secondary | ICD-10-CM | POA: Diagnosis not present

## 2024-01-30 DIAGNOSIS — F4310 Post-traumatic stress disorder, unspecified: Secondary | ICD-10-CM | POA: Diagnosis not present

## 2024-01-30 DIAGNOSIS — F319 Bipolar disorder, unspecified: Secondary | ICD-10-CM | POA: Diagnosis not present

## 2024-03-18 DIAGNOSIS — F411 Generalized anxiety disorder: Secondary | ICD-10-CM | POA: Diagnosis not present

## 2024-03-18 DIAGNOSIS — F4310 Post-traumatic stress disorder, unspecified: Secondary | ICD-10-CM | POA: Diagnosis not present

## 2024-03-18 DIAGNOSIS — F319 Bipolar disorder, unspecified: Secondary | ICD-10-CM | POA: Diagnosis not present

## 2024-05-21 ENCOUNTER — Other Ambulatory Visit: Payer: Self-pay | Admitting: Family Medicine

## 2024-05-31 ENCOUNTER — Other Ambulatory Visit: Payer: Self-pay | Admitting: Family Medicine

## 2024-05-31 MED ORDER — ALPRAZOLAM 0.5 MG PO TABS: 0.5000 mg | ORAL_TABLET | Freq: Every day | ORAL | 2 refills | Status: DC | PRN

## 2024-05-31 NOTE — Addendum Note (Signed)
 Addended by: Archibald Marchetta C on: 05/31/2024 04:25 PM   Modules accepted: Orders

## 2024-06-27 ENCOUNTER — Other Ambulatory Visit: Payer: Self-pay | Admitting: Family Medicine

## 2024-06-27 ENCOUNTER — Other Ambulatory Visit: Payer: Self-pay

## 2024-06-27 MED ORDER — ALPRAZOLAM 0.5 MG PO TABS: 0.5000 mg | ORAL_TABLET | Freq: Every day | ORAL | 0 refills | Status: AC | PRN

## 2024-06-27 NOTE — Telephone Encounter (Signed)
 Patient calls nurse line regarding alprazolam  prescription.   She reports that she is unable to pick up refill due to resident [facility] not being authorized to prescribed this medication.   This also happened last month. Forwarding to AM preceptor.   Chiquita JAYSON English, RN
